# Patient Record
Sex: Male | Born: 1945 | Race: White | Hispanic: No | Marital: Married | Smoking: Never smoker
Health system: Southern US, Community
[De-identification: ages and names within clinical notes are randomized; demographics above are authoritative.]

## PROBLEM LIST (undated history)

## (undated) DIAGNOSIS — K5790 Diverticulosis of intestine, part unspecified, without perforation or abscess without bleeding: Secondary | ICD-10-CM

## (undated) DIAGNOSIS — E538 Deficiency of other specified B group vitamins: Secondary | ICD-10-CM

## (undated) DIAGNOSIS — M51369 Other intervertebral disc degeneration, lumbar region without mention of lumbar back pain or lower extremity pain: Secondary | ICD-10-CM

## (undated) DIAGNOSIS — K8020 Calculus of gallbladder without cholecystitis without obstruction: Secondary | ICD-10-CM

## (undated) DIAGNOSIS — D3501 Benign neoplasm of right adrenal gland: Secondary | ICD-10-CM

## (undated) DIAGNOSIS — G4733 Obstructive sleep apnea (adult) (pediatric): Secondary | ICD-10-CM

## (undated) DIAGNOSIS — H9190 Unspecified hearing loss, unspecified ear: Secondary | ICD-10-CM

## (undated) DIAGNOSIS — G4730 Sleep apnea, unspecified: Secondary | ICD-10-CM

## (undated) DIAGNOSIS — I700 Atherosclerosis of aorta: Secondary | ICD-10-CM

## (undated) DIAGNOSIS — D3502 Benign neoplasm of left adrenal gland: Secondary | ICD-10-CM

## (undated) DIAGNOSIS — D509 Iron deficiency anemia, unspecified: Secondary | ICD-10-CM

## (undated) DIAGNOSIS — N429 Disorder of prostate, unspecified: Secondary | ICD-10-CM

## (undated) DIAGNOSIS — D649 Anemia, unspecified: Secondary | ICD-10-CM

## (undated) DIAGNOSIS — I2510 Atherosclerotic heart disease of native coronary artery without angina pectoris: Secondary | ICD-10-CM

## (undated) DIAGNOSIS — N433 Hydrocele, unspecified: Secondary | ICD-10-CM

## (undated) DIAGNOSIS — I493 Ventricular premature depolarization: Secondary | ICD-10-CM

## (undated) DIAGNOSIS — I10 Essential (primary) hypertension: Secondary | ICD-10-CM

## (undated) DIAGNOSIS — K4090 Unilateral inguinal hernia, without obstruction or gangrene, not specified as recurrent: Secondary | ICD-10-CM

## (undated) DIAGNOSIS — E785 Hyperlipidemia, unspecified: Secondary | ICD-10-CM

## (undated) DIAGNOSIS — E119 Type 2 diabetes mellitus without complications: Secondary | ICD-10-CM

## (undated) DIAGNOSIS — R590 Localized enlarged lymph nodes: Secondary | ICD-10-CM

## (undated) DIAGNOSIS — Z7901 Long term (current) use of anticoagulants: Secondary | ICD-10-CM

## (undated) DIAGNOSIS — R7989 Other specified abnormal findings of blood chemistry: Secondary | ICD-10-CM

## (undated) DIAGNOSIS — M4800 Spinal stenosis, site unspecified: Secondary | ICD-10-CM

## (undated) DIAGNOSIS — F419 Anxiety disorder, unspecified: Secondary | ICD-10-CM

## (undated) DIAGNOSIS — N400 Enlarged prostate without lower urinary tract symptoms: Secondary | ICD-10-CM

## (undated) DIAGNOSIS — M1990 Unspecified osteoarthritis, unspecified site: Secondary | ICD-10-CM

## (undated) DIAGNOSIS — N281 Cyst of kidney, acquired: Secondary | ICD-10-CM

## (undated) HISTORY — DX: Anemia, unspecified: D64.9

## (undated) HISTORY — PX: EYE SURGERY: SHX253

## (undated) HISTORY — PX: COLONOSCOPY: SHX174

## (undated) HISTORY — DX: Anxiety disorder, unspecified: F41.9

## (undated) HISTORY — DX: Diverticulosis of intestine, part unspecified, without perforation or abscess without bleeding: K57.90

## (undated) HISTORY — PX: REPLACEMENT TOTAL KNEE: SUR1224

## (undated) HISTORY — DX: Essential (primary) hypertension: I10

## (undated) HISTORY — DX: Spinal stenosis, site unspecified: M48.00

---

## 1999-11-13 ENCOUNTER — Emergency Department (HOSPITAL_COMMUNITY): Admission: EM | Admit: 1999-11-13 | Discharge: 1999-11-13 | Payer: Self-pay | Admitting: Emergency Medicine

## 1999-11-13 ENCOUNTER — Encounter: Payer: Self-pay | Admitting: Emergency Medicine

## 2003-06-09 ENCOUNTER — Encounter: Payer: Self-pay | Admitting: Internal Medicine

## 2006-07-22 ENCOUNTER — Inpatient Hospital Stay (HOSPITAL_COMMUNITY): Admission: EM | Admit: 2006-07-22 | Discharge: 2006-07-25 | Payer: Self-pay | Admitting: Emergency Medicine

## 2006-07-23 ENCOUNTER — Encounter (INDEPENDENT_AMBULATORY_CARE_PROVIDER_SITE_OTHER): Payer: Self-pay | Admitting: *Deleted

## 2006-07-25 ENCOUNTER — Encounter (INDEPENDENT_AMBULATORY_CARE_PROVIDER_SITE_OTHER): Payer: Self-pay | Admitting: *Deleted

## 2006-07-28 ENCOUNTER — Ambulatory Visit: Payer: Self-pay | Admitting: Internal Medicine

## 2006-08-05 ENCOUNTER — Ambulatory Visit: Payer: Self-pay | Admitting: Internal Medicine

## 2006-08-05 LAB — CONVERTED CEMR LAB
Basophils Absolute: 0.1 10*3/uL (ref 0.0–0.1)
Basophils Relative: 0.8 % (ref 0.0–1.0)
Eosinophils Absolute: 0.3 10*3/uL (ref 0.0–0.6)
Eosinophils Relative: 3.3 % (ref 0.0–5.0)
HCT: 32.5 % — ABNORMAL LOW (ref 39.0–52.0)
Hemoglobin: 11.4 g/dL — ABNORMAL LOW (ref 13.0–17.0)
Lymphocytes Relative: 22.2 % (ref 12.0–46.0)
MCHC: 35.2 g/dL (ref 30.0–36.0)
MCV: 94.7 fL (ref 78.0–100.0)
Monocytes Absolute: 0.9 10*3/uL — ABNORMAL HIGH (ref 0.2–0.7)
Monocytes Relative: 8.9 % (ref 3.0–11.0)
Neutro Abs: 6.3 10*3/uL (ref 1.4–7.7)
Neutrophils Relative %: 64.8 % (ref 43.0–77.0)
Platelets: 545 10*3/uL — ABNORMAL HIGH (ref 150–400)
RBC: 3.43 M/uL — ABNORMAL LOW (ref 4.22–5.81)
RDW: 13.5 % (ref 11.5–14.6)
WBC: 9.8 10*3/uL (ref 4.5–10.5)

## 2006-09-12 ENCOUNTER — Ambulatory Visit: Payer: Self-pay | Admitting: Internal Medicine

## 2008-03-23 ENCOUNTER — Ambulatory Visit: Payer: Self-pay | Admitting: Urology

## 2008-08-14 ENCOUNTER — Emergency Department: Payer: Self-pay | Admitting: Emergency Medicine

## 2008-10-31 ENCOUNTER — Ambulatory Visit: Payer: Self-pay | Admitting: Internal Medicine

## 2008-10-31 DIAGNOSIS — E119 Type 2 diabetes mellitus without complications: Secondary | ICD-10-CM | POA: Insufficient documentation

## 2008-10-31 DIAGNOSIS — K625 Hemorrhage of anus and rectum: Secondary | ICD-10-CM | POA: Insufficient documentation

## 2008-10-31 DIAGNOSIS — K5730 Diverticulosis of large intestine without perforation or abscess without bleeding: Secondary | ICD-10-CM | POA: Insufficient documentation

## 2008-11-30 ENCOUNTER — Telehealth: Payer: Self-pay | Admitting: Internal Medicine

## 2008-12-01 ENCOUNTER — Encounter: Payer: Self-pay | Admitting: Internal Medicine

## 2008-12-01 ENCOUNTER — Ambulatory Visit: Payer: Self-pay | Admitting: Internal Medicine

## 2008-12-05 ENCOUNTER — Encounter: Payer: Self-pay | Admitting: Internal Medicine

## 2010-01-19 ENCOUNTER — Ambulatory Visit: Payer: Self-pay | Admitting: Internal Medicine

## 2010-01-19 DIAGNOSIS — D649 Anemia, unspecified: Secondary | ICD-10-CM | POA: Insufficient documentation

## 2010-01-22 ENCOUNTER — Ambulatory Visit: Payer: Self-pay | Admitting: Oncology

## 2010-01-22 LAB — CONVERTED CEMR LAB
Basophils Absolute: 0.1 10*3/uL (ref 0.0–0.1)
Basophils Relative: 0.9 % (ref 0.0–3.0)
Eosinophils Absolute: 0.2 10*3/uL (ref 0.0–0.7)
Eosinophils Relative: 2.5 % (ref 0.0–5.0)
Ferritin: 54.7 ng/mL (ref 22.0–322.0)
Folate: 8.6 ng/mL
HCT: 33.9 % — ABNORMAL LOW (ref 39.0–52.0)
Hemoglobin: 12 g/dL — ABNORMAL LOW (ref 13.0–17.0)
Iron: 91 ug/dL (ref 42–165)
Lymphocytes Relative: 24.1 % (ref 12.0–46.0)
Lymphs Abs: 2.2 10*3/uL (ref 0.7–4.0)
MCHC: 35.5 g/dL (ref 30.0–36.0)
MCV: 97.1 fL (ref 78.0–100.0)
Monocytes Absolute: 1 10*3/uL (ref 0.1–1.0)
Monocytes Relative: 11.1 % (ref 3.0–12.0)
Neutro Abs: 5.5 10*3/uL (ref 1.4–7.7)
Neutrophils Relative %: 61.4 % (ref 43.0–77.0)
Platelets: 370 10*3/uL (ref 150.0–400.0)
RBC: 3.49 M/uL — ABNORMAL LOW (ref 4.22–5.81)
RDW: 14.2 % (ref 11.5–14.6)
Saturation Ratios: 24.9 % (ref 20.0–50.0)
Transferrin: 260.8 mg/dL (ref 212.0–360.0)
Vitamin B-12: 226 pg/mL (ref 211–911)
WBC: 9 10*3/uL (ref 4.5–10.5)

## 2010-01-31 ENCOUNTER — Encounter: Payer: Self-pay | Admitting: Internal Medicine

## 2010-01-31 ENCOUNTER — Telehealth: Payer: Self-pay | Admitting: Internal Medicine

## 2010-01-31 LAB — LACTATE DEHYDROGENASE: LDH: 171 U/L (ref 94–250)

## 2010-01-31 LAB — COMPREHENSIVE METABOLIC PANEL
ALT: 19 U/L (ref 0–53)
AST: 19 U/L (ref 0–37)
Albumin: 4 g/dL (ref 3.5–5.2)
Alkaline Phosphatase: 57 U/L (ref 39–117)
BUN: 12 mg/dL (ref 6–23)
CO2: 28 meq/L (ref 19–32)
Calcium: 9.1 mg/dL (ref 8.4–10.5)
Chloride: 103 mEq/L (ref 96–112)
Creatinine, Ser: 0.78 mg/dL (ref 0.40–1.50)
Glucose, Bld: 98 mg/dL (ref 70–99)
Potassium: 3.9 mEq/L (ref 3.5–5.3)
Sodium: 137 mEq/L (ref 135–145)
Total Bilirubin: 0.6 mg/dL (ref 0.3–1.2)
Total Protein: 7.1 g/dL (ref 6.0–8.3)

## 2010-01-31 LAB — CBC & DIFF AND RETIC
BASO%: 0.5 % (ref 0.0–2.0)
Basophils Absolute: 0.1 10*3/uL (ref 0.0–0.1)
EOS%: 2.9 % (ref 0.0–7.0)
Eosinophils Absolute: 0.3 10*3/uL (ref 0.0–0.5)
HCT: 35.3 % — ABNORMAL LOW (ref 38.4–49.9)
HGB: 12 g/dL — ABNORMAL LOW (ref 13.0–17.1)
Immature Retic Fract: 7.6 % (ref 0.00–13.40)
LYMPH%: 20.1 % (ref 14.0–49.0)
MCH: 33 pg (ref 27.2–33.4)
MCHC: 34 g/dL (ref 32.0–36.0)
MCV: 97 fL (ref 79.3–98.0)
MONO#: 0.8 10*3/uL (ref 0.1–0.9)
MONO%: 7.8 % (ref 0.0–14.0)
NEUT#: 7.1 10*3/uL — ABNORMAL HIGH (ref 1.5–6.5)
NEUT%: 68.7 % (ref 39.0–75.0)
Platelets: 334 10*3/uL (ref 140–400)
RBC: 3.64 10*6/uL — ABNORMAL LOW (ref 4.20–5.82)
RDW: 13.8 % (ref 11.0–14.6)
Retic %: 1.21 % (ref 0.50–1.60)
Retic Ct Abs: 44.04 10*3/uL (ref 24.10–77.50)
WBC: 10.3 10*3/uL (ref 4.0–10.3)
lymph#: 2.1 10*3/uL (ref 0.9–3.3)
nRBC: 0 % (ref 0–0)

## 2010-01-31 LAB — CHCC SMEAR

## 2010-01-31 LAB — MORPHOLOGY
PLT EST: ADEQUATE
RBC Comments: NORMAL

## 2010-02-02 ENCOUNTER — Ambulatory Visit: Payer: Self-pay | Admitting: Internal Medicine

## 2010-02-04 LAB — PROTEIN ELECTROPHORESIS, SERUM
Albumin ELP: 58.6 % (ref 55.8–66.1)
Alpha-1-Globulin: 3.4 % (ref 2.9–4.9)
Alpha-2-Globulin: 11.4 % (ref 7.1–11.8)
Beta 2: 5.4 % (ref 3.2–6.5)
Beta Globulin: 6.2 % (ref 4.7–7.2)
Gamma Globulin: 15 % (ref 11.1–18.8)
Total Protein, Serum Electrophoresis: 7.1 g/dL (ref 6.0–8.3)

## 2010-02-04 LAB — KAPPA/LAMBDA LIGHT CHAINS
Kappa free light chain: 0.65 mg/dL (ref 0.33–1.94)
Kappa:Lambda Ratio: 0.79 (ref 0.26–1.65)
Lambda Free Lght Chn: 0.82 mg/dL (ref 0.57–2.63)

## 2010-02-04 LAB — METHYLMALONIC ACID, SERUM: Methylmalonic Acid, Quantitative: 108 nmol/L (ref 87–318)

## 2010-02-05 LAB — CONVERTED CEMR LAB
Fecal Occult Blood: NEGATIVE
OCCULT 1: NEGATIVE
OCCULT 2: NEGATIVE
OCCULT 3: NEGATIVE
OCCULT 4: NEGATIVE
OCCULT 5: NEGATIVE

## 2010-02-13 ENCOUNTER — Telehealth (INDEPENDENT_AMBULATORY_CARE_PROVIDER_SITE_OTHER): Payer: Self-pay | Admitting: *Deleted

## 2010-06-08 ENCOUNTER — Ambulatory Visit: Payer: Self-pay | Admitting: Oncology

## 2010-06-12 LAB — CBC WITH DIFFERENTIAL/PLATELET
BASO%: 0.4 % (ref 0.0–2.0)
Basophils Absolute: 0 10*3/uL (ref 0.0–0.1)
EOS%: 2.8 % (ref 0.0–7.0)
Eosinophils Absolute: 0.2 10*3/uL (ref 0.0–0.5)
HCT: 37.2 % — ABNORMAL LOW (ref 38.4–49.9)
HGB: 12.9 g/dL — ABNORMAL LOW (ref 13.0–17.1)
LYMPH%: 16.4 % (ref 14.0–49.0)
MCH: 33.7 pg — ABNORMAL HIGH (ref 27.2–33.4)
MCHC: 34.6 g/dL (ref 32.0–36.0)
MCV: 97.3 fL (ref 79.3–98.0)
MONO#: 1 10*3/uL — ABNORMAL HIGH (ref 0.1–0.9)
MONO%: 12.4 % (ref 0.0–14.0)
NEUT#: 5.3 10*3/uL (ref 1.5–6.5)
NEUT%: 68 % (ref 39.0–75.0)
Platelets: 344 10*3/uL (ref 140–400)
RBC: 3.82 10*6/uL — ABNORMAL LOW (ref 4.20–5.82)
RDW: 12.8 % (ref 11.0–14.6)
WBC: 7.8 10*3/uL (ref 4.0–10.3)
lymph#: 1.3 10*3/uL (ref 0.9–3.3)

## 2010-06-12 LAB — COMPREHENSIVE METABOLIC PANEL
ALT: 22 U/L (ref 0–53)
AST: 18 U/L (ref 0–37)
Albumin: 4.4 g/dL (ref 3.5–5.2)
Alkaline Phosphatase: 62 U/L (ref 39–117)
BUN: 12 mg/dL (ref 6–23)
CO2: 23 meq/L (ref 19–32)
Calcium: 9.2 mg/dL (ref 8.4–10.5)
Chloride: 100 meq/L (ref 96–112)
Creatinine, Ser: 0.8 mg/dL (ref 0.40–1.50)
Glucose, Bld: 91 mg/dL (ref 70–99)
Potassium: 4.1 mEq/L (ref 3.5–5.3)
Sodium: 134 meq/L — ABNORMAL LOW (ref 135–145)
Total Bilirubin: 0.4 mg/dL (ref 0.3–1.2)
Total Protein: 7 g/dL (ref 6.0–8.3)

## 2010-06-12 LAB — IRON AND TIBC
%SAT: 23 % (ref 20–55)
Iron: 80 ug/dL (ref 42–165)
TIBC: 353 ug/dL (ref 215–435)
UIBC: 273 ug/dL

## 2010-06-12 LAB — FERRITIN: Ferritin: 63 ng/mL (ref 22–322)

## 2010-06-12 LAB — LACTATE DEHYDROGENASE: LDH: 153 U/L (ref 94–250)

## 2010-08-02 NOTE — Procedures (Signed)
Summary: EGD   EGD  Procedure date:  09/12/2006  Findings:      Location: [facility]  Findings: Duodenitis    EGD  Procedure date:  09/12/2006  Findings:      Location: [facility]  Findings: Duodenitis   Patient Name: [name] [name]. MRN:  Procedure Procedures: Panendoscopy (EGD) CPT: 43235.    with biopsy(s)/brushing(s). CPT: D1846139.  Personnel: Endoscopist: Wilhemina Bonito. Marina Goodell, MD.  Exam Location: Exam performed in Outpatient Clinic. Outpatient  Patient Consent: Procedure, Alternatives, Risks and Benefits discussed, consent obtained, from patient. Consent was obtained by the RN.  Indications  Evaluation of: Anemia,  Positive fecal occult blood test  Symptoms: Hematochezia.  History  Current Medications: Patient is not currently taking Coumadin.  Pre-Exam Physical: Performed Sep 12, 2006  Cardio-pulmonary exam, HEENT exam, Abdominal exam, Mental status exam WNL.  Comments: Pt. history reviewed/updated, physical exam performed prior to initiation of sedation?yes Exam Exam Info: Maximum depth of insertion Duodenum, intended Duodenum. Patient position: on left side. Vocal cords visualized. Gastric retroflexion performed. Images taken. ASA Classification: II. Tolerance: excellent.  Sedation Meds: Patient assessed and found to be appropriate for moderate (conscious) sedation. Fentanyl 75 mcg. given IV. Versed 7 mg. given IV. Cetacaine Spray 2 sprays given aerosolized.  Monitoring: BP and pulse monitoring done. Oximetry used. Supplemental O2 given  Findings Normal: Proximal Esophagus to Duodenal 2nd Portion.  MUCOSAL ABNORMALITY: Duodenal Bulb. Erythematous mucosa. RUT done, results pending. ICD9: Duodenitis without Hemorrhage: 535.60.   Assessment  Diagnoses: 535.60: Duodenitis without Hemorrhage. very mild.   Events  Unplanned Intervention: No unplanned interventions were required.  Unplanned Events: There were no  complications. Plans Disposition: After procedure patient sent to recovery. After recovery patient sent home.  Scheduling: Follow-up prn.   This report was created from the original endoscopy report, which was reviewed and signed by the above listed endoscopist.   cc:  Talmadge Coventry, MD      The Patient

## 2010-08-02 NOTE — Assessment & Plan Note (Signed)
Summary: RECTAL BLEEDING...AS.   History of Present Illness Visit Type: Follow-up Visit Primary GI MD: Yancey Flemings MD Primary Provider: Ursula Beath, MD Chief Complaint: rectal bleeding on 4/28, "looked like crushed cranberries", no bleeding now History of Present Illness:   65 year old with a history of hypertension, osteoarthritis,diabetes mellitus, depression, and diverticular bleeding. He presents today after an episode of rectal bleeding. He underwent initial colonoscopy in December 2004 and was found to have marked diverticulosis. He was hospitalized in January of 2008 with acute GI bleeding felt to be diverticular in origin. He subsequently underwent upper endoscopy in March of 2008 for Hemoccult-positive stool. This revealed mild duodenitis only. Helicobacter Pylori testing was negative. He has not been seen since. He did receive a routine recall notice for colonoscopy last year. Today's visit is prompted by an episode of rectal bleeding 5 days ago. Initially red that of moderate volume. This turned dark by evening and within several days was normal. Stools are now brown.There was no associated nausea, abdominal pain, dizziness, weakness, or rectal pain. He does have hemorrhoids. He takes 2 oral agents for his diabetes. He was taking aspirin which he discontinued with bleeding.   GI Review of Systems      Denies abdominal pain, acid reflux, belching, bloating, chest pain, dysphagia with liquids, dysphagia with solids, heartburn, loss of appetite, nausea, vomiting, vomiting blood, weight loss, and  weight gain.      Reports diverticulosis, hemorrhoids, and  rectal bleeding.     Denies anal fissure, black tarry stools, change in bowel habit, constipation, diarrhea, fecal incontinence, heme positive stool, irritable bowel syndrome, jaundice, light color stool, liver problems, and  rectal pain. Preventive Screening-Counseling & Management     Smoking Status: never     Does Patient  Exercise: yes      Drug Use:  no.      Current Medications (verified): 1)  Avandamet 09-998 Mg Tabs (Rosiglitazone-Metformin) .... Take 1 Tablet By Mouth Two Times A Day 2)  Glipizide 5 Mg Tabs (Glipizide) .... Take 1 Tablet By Mouth Two Times A Day 3)  Ambien 5 Mg Tabs (Zolpidem Tartrate) .... As Needed 4)  Xanax 1 Mg Tabs (Alprazolam) .... As Needed 5)  Benazepril Hcl 40 Mg Tabs (Benazepril Hcl) .... Take 1 Tablet By Mouth Two Times A Day 6)  Vitamin D 40981 Unit Caps (Ergocalciferol) .... Take 1 Tablet By Mouth Once Week 7)  Adult Aspirin Ec Low Strength 81 Mg Tbec (Aspirin) .... Take 1 Tablet By Mouth Once A Day 8)  Glucosamine-Chondroitin 500-400 Mg Caps (Glucosamine-Chondroitin) .... Take 1 Tablet By Mouth Two Times A Day  Allergies (verified): No Known Drug Allergies  Past History:  Past Medical History:    Anemia    Diabetes    GI Bleed-Diverticular bleed    Hypertension    Arthritis  Past Surgical History:    Unremarkable  Family History:    No FH of Colon Cancer:-sister had tumor on outside of colon, but unsure if colon cancer    Family History of Diabetes: Mother  Social History:    Married, 2 boys, 1 girls    Retired Emergency planning/management officer, delivers auto parts    Patient has never smoked.     Alcohol Use - yes occ    Daily Caffeine Use "too much"    Illicit Drug Use - no    Patient gets regular exercise. golf 2 x week, pool/swimming 5 hours/week    Smoking Status:  never  Drug Use:  no    Does Patient Exercise:  yes  Review of Systems       joint aches, elevated blood pressure. All other systems reviewed and reported as negative  Vital Signs:  Patient profile:   65 year old male Height:      72 inches Weight:      307 pounds BMI:     41.79 Pulse rate:   76 / minute Pulse rhythm:   irregular BP sitting:   152 / 88  (right arm) Cuff size:   large  Vitals Entered By: Francee Piccolo CMA (Oct 31, 2008 2:24 PM)  Physical Exam  General:   overweight,Well developed, well nourished, no acute distress. Head:  Normocephalic and atraumatic. Eyes:  PERRLA, no icterus. Nose:  No deformity, discharge,  or lesions. Mouth:  No deformity or lesions, dentition normal. Neck:  Supple; no masses or thyromegaly. Lungs:  Clear throughout to auscultation. Heart:  Regular rate and rhythm; no murmurs, rubs,  or bruits. Abdomen:  Soft, nontender and nondistended. No masses, hepatosplenomegaly or hernias noted. Normal bowel sounds. Rectal:  deferred Msk:  Symmetrical with no gross deformities. Normal posture. Pulses:  Normal pulses noted. Extremities:  No clubbing, cyanosis, edema or deformities noted. Neurologic:  Alert and  oriented x4;  grossly normal neurologically. Skin:  Intact without significant lesions or rashes. Psych:  Alert and cooperative. Normal mood and affect.   Impression & Recommendations:  Problem # 1:  RECTAL BLEEDING (ICD-93.59) 65 year old with multiple medical problems who presents for evaluation after experiencing self-limited minor lower GI bleeding. This may have been a mild diverticular bleed or possibly hemorrhoidal. Neoplasia is also possible.  Plan: #1. Colonoscopy. The nature of the procedure as well as the risks, benefits, and alternatives have been reviewed. He understood and agreed to proceed. Movi prep prescribed. The patient instructed on its use. He is high-risk due to body habitus and diabetes. We will hold his oral diabetic agents the day of the procedure an effort to avoid unwanted hypoglycemia.  Problem # 2:  DM (ICD-250.00) as it pertains to his procedural management. See above.  Problem # 3:  DIVERTICULOSIS OF COLON (ICD-562.10) severe diverticulosis with a history of diverticular bleeding.  Other Orders: Colonoscopy (Colon)  Patient Instructions: 1)  Colonoscopy [facility] 12/01/08 11:30 2)  Movi prep instructions given to patient. 3)  Movi prep rx. sent to pharmacy 4)  Colonoscopy and Flexible  Sigmoidoscopy brochure given.  5)  Patient to hold diabetic meds morning of procedure. 6)  The medication list was reviewed and reconciled.  All changed / newly prescribed medications were explained.  A complete medication list was provided to the patient / caregiver. 7)  Copy to: Dr. Ursula Beath Prescriptions: MOVIPREP 100 GM  SOLR (PEG-KCL-NACL-NASULF-NA ASC-C) As per prep instructions.  #1 x 0   Entered by:   Milford Cage CMA   Authorized by:   Hilarie Fredrickson MD   Signed by:   Milford Cage CMA on 10/31/2008   Method used:   Electronically to        [facility] [phone]* (retail)       [address] [place], [place]  [address]       Ph: [phone] or [phone]       Fax: [phone]   RxID:   [id number]

## 2010-08-02 NOTE — Procedures (Signed)
Summary: Colonoscopy   Colonoscopy  Procedure date:  12/01/2008  Findings:      Location:  [facility].    Procedures Next Due Date:    Colonoscopy: 11/2018  COLONOSCOPY PROCEDURE REPORT  PATIENT:  [name], [name]  MR#:  [id number] BIRTHDATE:   13-Apr-1946, 65 yrs. old   GENDER:   male  ENDOSCOPIST:   Wilhemina Bonito. Eda Keys, MD Referred by: Self / Office  PROCEDURE DATE:  12/01/2008 PROCEDURE:  Colonoscopy with snare polypectomy ASA CLASS:   Class II INDICATIONS: rectal bleeding   MEDICATIONS:    Fentanyl 100 mcg IV, Versed 10 mg IV  DESCRIPTION OF PROCEDURE:   After the risks benefits and alternatives of the procedure were thoroughly explained, informed consent was obtained.  Digital rectal exam was performed and revealed small external hemorrhoids.   The LB CF-[id number] endoscope was introduced through the anus and advanced to the cecum, which was identified by both the appendix and ileocecal valve, without limitations.  TIME TO CECUM = 4:11 MIN.The quality of the prep was excellent, using MoviPrep.  The instrument was then withdrawn (TIME = 13:24  MIN) as the colon was fully examined. <<PROCEDUREIMAGES>>              <<OLD IMAGES>>  FINDINGS:  Two 4mm polyps were found in the transverse and descending colon. Polyps were snared without cautery. Retrieval was successful.  Severe diverticulosis was found throughout the colon.   Retroflexed views in the rectum revealed no abnormalities.    The scope was then withdrawn from the patient and the procedure completed.  COMPLICATIONS:   None  ENDOSCOPIC IMPRESSION:  1) Two polyps in the descending colon - removed  2) Severe diverticulosis throughout the colon  RECOMMENDATIONS:  1) Repeat colonoscopy in 5 years if polyp adenomatous; otherwise 10 years     _______________________________ Wilhemina Bonito. Eda Keys, MD  CC: Ursula Beath, MD; The Patient    REPORT OF SURGICAL PATHOLOGY   Case #: [id number] Patient  Name: [name] [name], [name] [name] Office Chart Number:  [id number]   MRN: [id number] Pathologist: Beulah Gandy. Luisa Hart, MD DOB/Age  Sep 10, 1945 (Age: 65)    Gender: M Date Taken:  12/01/2008 Date Received: 12/02/2008   FINAL DIAGNOSIS   ***MICROSCOPIC EXAMINATION AND DIAGNOSIS***   COLON, TRANSVERSE AND DESCENDING POLYPS:  TWO HYPERPLASTIC POLYPS.  NO ADENOMATOUS CHANGE OR MALIGNANCY IDENTIFIED.   mj Date Reported:  12/05/2008     Beulah Gandy. Luisa Hart, MD *** Electronically Signed Out By JDP ***  December 05, 2008 MRN: [id number]    [facility] [address] [place], [place]  [address]    Dear [name],  I am pleased to inform you that the colon polyp(s) removed during your recent colonoscopy was (were) found to be benign (no cancer detected) upon pathologic examination.  I recommend you have a repeat colonoscopy examination in 10 years to look for recurrent polyps, as having colon polyps increases your risk for having recurrent polyps or even colon cancer in the future.  Should you develop new or worsening symptoms of abdominal pain, bowel habit changes or bleeding from the rectum or bowels, please schedule an evaluation with either your primary care physician or with me.  Additional information/recommendations:  __ No further action with gastroenterology is needed at this time. Please      follow-up with your primary care physician for your other healthcare      needs.    Please call us if you are having persistent problems  or have questions about your condition that have not been fully answered at this time.  Sincerely,  Hilarie Fredrickson MD  This letter has been electronically signed by your physician.   This report was created from the original endoscopy report, which was reviewed and signed by the above listed endoscopist.

## 2010-08-02 NOTE — Discharge Summary (Signed)
Summary: Hospital Discharge   NAME:  [name] [name], [name] [name] NO.:  [id number]   MEDICAL RECORD NO.:  [id number]          PATIENT TYPE:  INP   LOCATION:  1424                         FACILITY:  [facility]   PHYSICIAN:  Mobolaji B. Bakare, M.D.DATE OF BIRTH:  09-24-1945   DATE OF ADMISSION:  07/22/2006  DATE OF DISCHARGE:  07/25/2006                               DISCHARGE SUMMARY   PRIMARY CARE PHYSICIAN:  Talmadge Coventry, M.D.   GASTROENTEROLOGIST:  Wilhemina Bonito. Marina Goodell, MD   PRIMARY DIAGNOSIS:  1. GI bleed, likely lower GI.  2. Acute blood loss secondary to #1.  3. Diabetes mellitus.  4. Hypertension.  5. Obesity.   CONSULTATIONS:  GI consults provided by Dr. Lina Sar.   PROCEDURES:  Red blood cell tagged scan.  This was negative for active  bleeding.   BRIEF HISTORY:  Please refer to the admission H&P done by Dr. Michaelyn Barter.  In brief, [name] [name] is a 65 year old Caucasian male who  presented with rectal bleeding, had large bowel movement which was  bright red blood.  It was unclear if this was mixed with stool or not.  However, he was symptomatic, not feeling well, and he went to Dr.  Stephannie Peters office on the day of admission.  CBC that was done revealed  hemoglobin 6.8, and he was sent to the emergency room.  On arrival in  the emergency room, the patient was normotensive with a blood pressure  of 136/45, pulse 100, otherwise, rest of physical examination was  unremarkable except for pallor.  He was admitted for further evaluation  and blood transfusion.   HOSPITAL COURSE:  #1 - GI BLEED STATUS POST SEVEN UNITS OF PACKED RED  BLOOD CELLS.  This was felt to be lower GI.  The patient did not have  hematemesis or abdominal pain, although, he has been on aspirin and has  been using ibuprofen regularly for pain.  Stool was not previously  melenic, it was more dynamically stable at the time of hospitalization.  He was seen by Gastroenterology, Dr.  Juanda Chance.  He  was conservatively  managed.  There was no active bleeding during the course of  hospitalization.  Bowel movements did not have bright red blood, it was  old blood noted on stool.  He was transfused with a total of seven units  of packed red blood cells.  The patient remained hemodynamically stable  during the course of hospitalization.  Decision was made because of  patient's preference to have colonoscopy done as outpatient.  He was  stable enough to be discharged.  Hemoglobin, at the time of discharge,  was 10.6 with hematocrit of 30.   #2 - DIABETES MELLITUS.  The patient was on Avandamet and glipizide  prior to hospitalization.  These were held because of the n.p.o. status  and clear liquid.  We gradually introduced Avandamet.  He is now having  diabetic diet.  He will resume his previous own medication.   #3 - HYPERTENSION.  This was fairly controlled during the course of  hospitalization with the  introduction of the Hyzaar.  Blood pressure at  the time of discharge was 154/82.  This will be further optimized by Dr.  Smith Mince.   DISCHARGE LABORATORIES:  Sodium 133, potassium 4.7, chloride 99, CO2 28,  BUN 12, creatinine 0.78, calcium 8.8, hemoglobin 12.6, hematocrit 30.0.   DISCHARGE MEDICATIONS:  1. Iron sulfate one p.o. b.i.d.  2. Avandamet 09/998 p.o. b.i.d.  3. Glipizide 5 mg b.i.d.  4. Hyzaar.  5. Zoloft.   DISCONTINUED MEDICATIONS:  1. Ibuprofen.  2. Aspirin.   RECOMMENDATIONS:  1. Follow up with Dr. Marina Goodell on August 05, 2006, at 1:45 to make      arrangements for colonoscopy.  2. Follow up with Dr. Smith Mince in one week.  3. CBC to be checked in one week.   DISCHARGE INSTRUCTIONS:  The patient was instructed to return to the  emergency room should he develop persistent dizziness and rectal  bleeding recourse.      Mobolaji B. Corky Downs, M.D.  Electronically Signed     MBB/MEDQ  D:  07/25/2006  T:  07/25/2006  Job:  [id number]   cc:   Talmadge Coventry,  M.D.  Fax: [phone]   Wilhemina Bonito. Marina Goodell, MD  [address]  [place]  [place] [address]

## 2010-08-02 NOTE — Assessment & Plan Note (Signed)
Summary: LOW HEMOGLOBIN...EM   History of Present Illness Visit Type: Follow-up Visit Primary GI MD: Yancey Flemings MD Primary Provider: Ursula Beath, MD Chief Complaint: Anemia - iron def. History of Present Illness:   65 year old with a history of hypertension, diabetes mellitus,  anxiety/depression, and acute diverticular bleed. He is sent today regarding anemia. Patient's last hemoglobin in this office was February 2008. The hemoglobin was 11.4 with an MCV of 94.7. Blood work from February 2011 shows a hemoglobin of 11.1 with an MCV of 96, in March 11 0.8 with an MCV of 97 and on December 20, 2009 11.4 with MCV of 99. The patient denies any GI symptoms. He denies GI bleeding. Takes baby aspirin daily. Has been on iron for 4 months since his blood work in February. He is accompanied today by his wife. His last colonoscopy was in June 2010 then revealed severe diverticulosis and diminutive polyps. He did undergo upper endoscopy in 2008 for anemia and Hemoccult-positive stool. This was essentially normal except for mild non-erosive duodenitis.   GI Review of Systems      Denies abdominal pain, acid reflux, belching, bloating, chest pain, dysphagia with liquids, dysphagia with solids, heartburn, loss of appetite, nausea, vomiting, vomiting blood, weight loss, and  weight gain.      Reports diverticulosis and  hemorrhoids.     Denies anal fissure, black tarry stools, change in bowel habit, constipation, diarrhea, fecal incontinence, heme positive stool, irritable bowel syndrome, jaundice, light color stool, liver problems, rectal bleeding, and  rectal pain.    Current Medications (verified): 1)  Avandamet 09-998 Mg Tabs (Rosiglitazone-Metformin) .... Take 1 Tablet By Mouth Two Times A Day 2)  Glipizide 5 Mg Tabs (Glipizide) .... Take 1 Tablet By Mouth Two Times A Day 3)  Ambien 5 Mg Tabs (Zolpidem Tartrate) .... As Needed 4)  Xanax 1 Mg Tabs (Alprazolam) .... As Needed 5)  Benazepril Hcl 40 Mg  Tabs (Benazepril Hcl) .... Take 1 Tablet By Mouth Two Times A Day 6)  Vitamin D 16109 Unit Caps (Ergocalciferol) .... Take 1 Tablet By Mouth Once Week 7)  Adult Aspirin Ec Low Strength 81 Mg Tbec (Aspirin) .... Take 1 Tablet By Mouth Once A Day 8)  Glucosamine-Chondroitin 500-400 Mg Caps (Glucosamine-Chondroitin) .... Take 1 Tablet By Mouth Two Times A Day 9)  Ferrous Sulfate 325 (65 Fe) Mg Tabs (Ferrous Sulfate) .... Two Times A Day 10)  Terazosin Hcl 1 Mg Caps (Terazosin Hcl) .... Once Daily  Allergies (verified): No Known Drug Allergies  Past History:  Past Medical History: Reviewed history from 10/31/2008 and no changes required. Anemia Diabetes GI Bleed-Diverticular bleed Hypertension Arthritis  Past Surgical History: Reviewed history from 10/31/2008 and no changes required. Unremarkable  Family History: Reviewed history from 10/31/2008 and no changes required. No FH of Colon Cancer:-sister had tumor on outside of colon, but unsure if colon cancer Family History of Diabetes: Mother  Social History: Reviewed history from 10/31/2008 and no changes required. 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  Alcohol Use - yes occ Daily Caffeine Use "too much" Illicit Drug Use - no Patient gets regular exercise. golf 2 x week, pool/swimming 5 hours/week  Review of Systems       The patient complains of anemia and arthritis/joint pain.  The patient denies allergy/sinus, anxiety-new, back pain, blood in urine, breast changes/lumps, change in vision, confusion, cough, coughing up blood, depression-new, fainting, fatigue, fever, headaches-new, hearing problems, heart murmur,  heart rhythm changes, itching, menstrual pain, muscle pains/cramps, night sweats, nosebleeds, pregnancy symptoms, shortness of breath, skin rash, sleeping problems, sore throat, swelling of feet/legs, swollen lymph glands, thirst - excessive , urination -  excessive , urination changes/pain, urine leakage, vision changes, and voice change.    Vital Signs:  Patient profile:   65 year old male Height:      72 inches Weight:      310.25 pounds BMI:     42.23 Pulse rate:   88 / minute Pulse rhythm:   irregular BP sitting:   152 / 86  (left arm) Cuff size:   large  Vitals Entered By: June McMurray CMA Duncan Dull) (January 19, 2010 3:38 PM)  Physical Exam  General:  Well developed,obese, well nourished, no acute distress. Head:  Normocephalic and atraumatic. Eyes:  PERRLA, no icterus. Ears:  Normal auditory acuity. Mouth:  No deformity or lesions Neck:  Supple; no masses or thyromegaly. Lungs:  Clear throughout to auscultation. Heart:  Regular rate and rhythm; no murmurs, rubs,  or bruits. Abdomen:  Soft, nontender and nondistended. No masses, hepatosplenomegaly or hernias noted. Normal bowel sounds. Msk:  Symmetrical with no gross deformities. Normal posture. Pulses:  Normal pulses noted. Extremities:  No clubbing, cyanosis, edema or deformities noted. Neurologic:  Alert and  oriented x4 Skin:  Intact without significant lesions or rashes. Psych:  Alert and cooperative. Normal mood and affect.   Impression & Recommendations:  Problem # 1:  ANEMIA-UNSPECIFIED (ICD-285.9) patient presents today with normocytic to slightly macrocytic anemia with stable hemoglobins in the mid 11 range since 2008. Nothing to suggest iron deficiency or GI blood loss. Etiology of chronic persisting anemia unclear. No response to iron therapy.  Plan: #1. Repeat CBC today #2. B12, folate, and iron studies including ferritin #3. Hemoccult cards #4. Consider hematology referral pending the results of the above  Problem # 2:  DIVERTICULOSIS OF COLON (ICD-562.10) with a history of acute diverticular bleeding. No recurrence since January 2008  Problem # 3:  SCREENING COLORECTAL-CANCER (ICD-V76.51) screening up-to-date. Due for routine repeat screening in  2020  Other Orders: TLB-CBC Platelet - w/Differential (85025-CBCD) TLB-Ferritin (82728-FER) TLB-Iron, (Fe) Total (83540-FE) TLB-IBC Pnl (Iron/FE;Transferrin) (83550-IBC) TLB-B12, Serum-Total ONLY (04540-J81) TLB-Folic Acid (Folate) (82746-FOL) [facility] Hemoccult Cards #3 (take home) (Hem cards #3)  Patient Instructions: 1)  Labs ordered for patient to have drawn today. 2)  Hemoccult cards given to patient with instructions-Please mail or bring back within 2 weeks time. 3)  Copy sent to : Clancy Gourd, MD 4)  The medication list was reviewed and reconciled.  All changed / newly prescribed medications were explained.  A complete medication list was provided to the patient / caregiver.

## 2010-08-02 NOTE — Procedures (Signed)
Summary: Colonoscopy   Colonoscopy  Procedure date:  06/09/2003  Findings:      Results: Diverticulosis.       Location:  [facility].    Comments:      Repeat colonoscopy in 5 years.   Procedures Next Due Date:    Colonoscopy: 05/2008  Colonoscopy  Procedure date:  06/09/2003  Findings:      Results: Diverticulosis.       Location:  [facility].    Comments:      Repeat colonoscopy in 5 years.   Procedures Next Due Date:    Colonoscopy: 05/2008 Patient Name: [name] [name], [name] [name]. MRN:  Procedure Procedures: Colonoscopy CPT: [phone].  Personnel: Endoscopist: Wilhemina Bonito. Marina Goodell, MD.  Referred By: Talmadge Coventry, MD.  Exam Location: Exam performed in Outpatient Clinic. Outpatient  Patient Consent: Procedure, Alternatives, Risks and Benefits discussed, consent obtained, from patient. Consent was obtained by the RN.  Indications  Average Risk Screening Routine.  History  Current Medications: Patient is not currently taking Coumadin.  Pre-Exam Physical: Performed Jun 09, 2003. Entire physical exam was normal.  Exam Exam: Extent of exam reached: Cecum, extent intended: Cecum.  The cecum was identified by appendiceal orifice and IC valve. Patient position: on left side. Colon retroflexion performed. Images taken. ASA Classification: II. Tolerance: excellent.  Monitoring: Pulse and BP monitoring, Oximetry used. Supplemental O2 given.  Colon Prep Used Golytely for colon prep. Prep results: fair, adequate exam.  Sedation Meds: Patient assessed and found to be appropriate for moderate (conscious) sedation. Fentanyl 100 mcg. given IV. Versed 12 given IV.  Comments: IMAGE FILE DOWN FOR PHOTO DOCUMENATATION Findings - DIVERTICULOSIS: Ascending Colon to Sigmoid Colon. ICD9: Diverticulosis, Colon: 562.10. Comments: MARKED CHANGES THROUGHOUT.   Assessment Abnormal examination, see findings above.  Diagnoses: 562.10: Diverticulosis, Colon.    Events  Unplanned Interventions: No intervention was required.  Unplanned Events: There were no complications. Plans Disposition: After procedure patient sent to recovery. After recovery patient sent home.  Scheduling/Referral: Colonoscopy, to Wilhemina Bonito. Marina Goodell, MD, IN 5 YEARS FOR REPEAT SCREENING,   Comments: RETURN TO THE CARE OF DR. MAZZOCCHI  This report was created from the original endoscopy report, which was reviewed and signed by the above listed endoscopist.   cc:  Talmadge Coventry, MD

## 2010-08-02 NOTE — Progress Notes (Signed)
Summary: Request for Lab results  Phone Note From Other Clinic Call back at [phone]   Caller: Dewayne Hatch, nurse Call For: Dr. Marina Goodell Summary of Call: Dr. Jethro Bolus would like results from stool sample August 1st... fax [phone] Initial call taken by: Vallarie Mare,  January 31, 2010 4:46 PM  Follow-up for Phone Call        Lab is checking to see if pt. submitted stool specimens and they will call Patty. Follow-up by: Teryl Lucy RN,  January 31, 2010 5:00 PM  Additional Follow-up for Phone Call Additional follow up Details #1::        The lab called and states that the pt never turned in the Hem cards.  I called the pt to see if he still has them and if there was a problem getting them completed and I left a message on his machine.  I called Ann at Dr Lodema Pilot office and advised her the pt never completed the cards and that I have a message to the pt to return my call. Additional Follow-up by: Chales Abrahams CMA Duncan Dull),  February 01, 2010 9:36 AM    Additional Follow-up for Phone Call Additional follow up Details #2::    pt says he mailed the cards on 01/29/10.  I called the lab again to see if they recieved them today.  she will call  me back. Chales Abrahams CMA Duncan Dull)  February 01, 2010 1:36 PM   Lab has recieved the cards.  Will send them to Dr Gaylyn Rong when available. Follow-up by: Chales Abrahams CMA Duncan Dull),  February 02, 2010 2:56 PM

## 2010-08-02 NOTE — Letter (Signed)
Summary: Patient Notice- Polyp Results  [facility]  [address] [place], [place] [address]   Phone: [phone]  Fax: [phone]        December 05, 2008 MRN: [id number]    [facility] [address] [place], [place]  [address]    Dear Mr. QUINNEY,  I am pleased to inform you that the colon polyp(s) removed during your recent colonoscopy was (were) found to be benign (no cancer detected) upon pathologic examination.  I recommend you have a repeat colonoscopy examination in 10 years to look for recurrent polyps, as having colon polyps increases your risk for having recurrent polyps or even colon cancer in the future.  Should you develop new or worsening symptoms of abdominal pain, bowel habit changes or bleeding from the rectum or bowels, please schedule an evaluation with either your primary care physician or with me.  Additional information/recommendations:  __ No further action with gastroenterology is needed at this time. Please      follow-up with your primary care physician for your other healthcare      needs.    Please call us if you are having persistent problems or have questions about your condition that have not been fully answered at this time.  Sincerely,  Hilarie Fredrickson MD  This letter has been electronically signed by your physician.

## 2010-08-02 NOTE — Progress Notes (Signed)
Summary: [facility] appt  Phone Note Outgoing Call   Call placed by: Chales Abrahams CMA Duncan Dull),  February 13, 2010 10:25 AM Summary of Call: called [facility] to see if appt has been scheduled yet.  left message on machine to call back  Initial call taken by: Chales Abrahams CMA Duncan Dull),  February 13, 2010 10:25 AM  Follow-up for Phone Call        spoke with the pt he has been seen by Dr Gaylyn Rong.  I have called [facility] and asked them to send a copy of the office visit Follow-up by: Chales Abrahams CMA Duncan Dull),  February 13, 2010 4:31 PM

## 2010-08-02 NOTE — Progress Notes (Signed)
Summary: ? re prep  Phone Note Call from Patient Call back at Home Phone [phone]   Caller: Patient Call For: Tyger Oka Reason for Call: Talk to Nurse Summary of Call: Patient has questions regarding prep instructions Initial call taken by: Tawni Levy,  November 30, 2008 10:58 AM  Follow-up for Phone Call         2 attempts to return pt call,left mess x1 and Erlinda Hong left mess. x1. As of 4:45pm ,pt. has not called back. Follow-up by: Alease Frame RN,  November 30, 2008 4:48 PM    called pt back ,no answer,left mess fpr pt to call us back. 1415 November 30, 2008/ph

## 2010-08-02 NOTE — Letter (Signed)
[facility]   Imported By: Lennie Odor 02/14/2010 14:24:47  _____________________________________________________________________  External Attachment:    Type:   Image     Comment:   External Document

## 2010-10-03 ENCOUNTER — Other Ambulatory Visit: Payer: Self-pay | Admitting: Oncology

## 2010-10-03 ENCOUNTER — Encounter (HOSPITAL_BASED_OUTPATIENT_CLINIC_OR_DEPARTMENT_OTHER): Payer: Medicare Other | Admitting: Oncology

## 2010-10-03 DIAGNOSIS — D649 Anemia, unspecified: Secondary | ICD-10-CM

## 2010-10-03 DIAGNOSIS — I10 Essential (primary) hypertension: Secondary | ICD-10-CM

## 2010-10-03 DIAGNOSIS — Z7982 Long term (current) use of aspirin: Secondary | ICD-10-CM

## 2010-10-03 DIAGNOSIS — E119 Type 2 diabetes mellitus without complications: Secondary | ICD-10-CM

## 2010-10-03 LAB — CBC WITH DIFFERENTIAL/PLATELET
BASO%: 0.5 % (ref 0.0–2.0)
Basophils Absolute: 0 10*3/uL (ref 0.0–0.1)
EOS%: 2.2 % (ref 0.0–7.0)
Eosinophils Absolute: 0.2 10*3/uL (ref 0.0–0.5)
HCT: 35.3 % — ABNORMAL LOW (ref 38.4–49.9)
HGB: 12.1 g/dL — ABNORMAL LOW (ref 13.0–17.1)
LYMPH%: 21.8 % (ref 14.0–49.0)
MCH: 33.3 pg (ref 27.2–33.4)
MCHC: 34.4 g/dL (ref 32.0–36.0)
MCV: 96.9 fL (ref 79.3–98.0)
MONO#: 0.7 10*3/uL (ref 0.1–0.9)
MONO%: 7.7 % (ref 0.0–14.0)
NEUT#: 6 10*3/uL (ref 1.5–6.5)
NEUT%: 67.8 % (ref 39.0–75.0)
Platelets: 335 10*3/uL (ref 140–400)
RBC: 3.64 10*6/uL — ABNORMAL LOW (ref 4.20–5.82)
RDW: 13.2 % (ref 11.0–14.6)
WBC: 8.9 10*3/uL (ref 4.0–10.3)
lymph#: 1.9 10*3/uL (ref 0.9–3.3)

## 2010-10-03 LAB — COMPREHENSIVE METABOLIC PANEL
ALT: 14 U/L (ref 0–53)
AST: 16 U/L (ref 0–37)
Albumin: 4.2 g/dL (ref 3.5–5.2)
Alkaline Phosphatase: 70 U/L (ref 39–117)
BUN: 11 mg/dL (ref 6–23)
CO2: 23 mEq/L (ref 19–32)
Calcium: 8.8 mg/dL (ref 8.4–10.5)
Chloride: 98 mEq/L (ref 96–112)
Creatinine, Ser: 0.81 mg/dL (ref 0.40–1.50)
Glucose, Bld: 159 mg/dL — ABNORMAL HIGH (ref 70–99)
Potassium: 4.3 meq/L (ref 3.5–5.3)
Sodium: 130 meq/L — ABNORMAL LOW (ref 135–145)
Total Bilirubin: 0.4 mg/dL (ref 0.3–1.2)
Total Protein: 6.5 g/dL (ref 6.0–8.3)

## 2010-10-03 LAB — IRON AND TIBC
%SAT: 21 % (ref 20–55)
Iron: 68 ug/dL (ref 42–165)
TIBC: 322 ug/dL (ref 215–435)
UIBC: 254 ug/dL

## 2010-10-03 LAB — FERRITIN: Ferritin: 70 ng/mL (ref 22–322)

## 2010-10-08 LAB — GLUCOSE, CAPILLARY
Glucose-Capillary: 107 mg/dL — ABNORMAL HIGH (ref 70–99)
Glucose-Capillary: 91 mg/dL (ref 70–99)

## 2010-11-16 NOTE — Assessment & Plan Note (Signed)
[facility]                         GASTROENTEROLOGY OFFICE NOTE   SEGUNDO, MAKELA                           MRN:          [id number]  DATE:08/05/2006                            DOB:          1945/09/21    REFERRING PHYSICIAN:  Talmadge Coventry, M.D.   REASON FOR CONSULTATION:  GI bleed.   HISTORY:  This is a pleasant, 65 year old, white male with a history of  hypertension, diabetes and depression. He was initially seen in this  office as an outpatient after direct referral for screening colonoscopy  June 09, 2003. The examination revealed severe pandiverticulosis. No  other abnormalities. Followup in 5 years recommended. The patient is now  referred through the courtesy of Dr. Smith Mince regarding recent problems  with GI bleeding. The patient reports to me that this past summer he was  told that he was borderline anemic. Repeat hemoglobin shortly thereafter  was in the low normal range. He had been using nonsteroidal  antiinflammatory drugs and there was some consideration that he may have  had some occult blood loss on this basis. He has had fatigue in recent  months. He was otherwise well until July 22, 2006 when he was  admitted to the hospital after having had 2-3 days of severe  intermittent rectal bleeding. This was painless. On admission to [facility], he was anemic with a hemoglobin of 7.7. His MCV was normal.  Coagulation parameters normal. Basic metabolic panel revealed normal  BUN. He was transfused multiple units of blood. After being admitted, he  had no further bleeding. He underwent a tagged red blood cell scan which  was negative. He was seen in consultation by colleague, Dr. Juanda Chance. No  endoscopic investigations performed. He was asked to discontinue  ibuprofen and aspirin. He was discharged home with a hemoglobin of 10.6.  He was scheduled for this followup appointment regarding possible  colonoscopy. He has not a  followup CBC since discharge. The patient has  been on iron. He reports to me no further acute bleeding. Still problems  with fatigue.   PAST MEDICAL HISTORY:  1. Hypertension.  2. Diabetes.  3. Depression.   ALLERGIES:  No known drug allergies.   FAMILY HISTORY:  Sister had unspecified type of cancer outside the  colon that was discovered during gastric bypass surgery. Mother with  diabetes.   CURRENT MEDICATIONS:  1. Zinc.  2. Multivitamin.  3. Iron.  4. Potassium.  5. Glipizide 5 mg b.i.d.  6. Avandamet 09/998 b.i.d.  7. Hyzaar unspecified dosage daily.  8. Zoloft 100 mg daily.  9. Xanax 0.5 mg an unspecified number of times per day.   SOCIAL HISTORY:  The patient is married with 3 children. He lives with  his wife. He has a college degree. 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 He does not smoke and occasionally uses  alcohol.   REVIEW OF SYSTEMS:  Per diagnostic evaluation form.   PHYSICAL EXAMINATION:  GENERAL:  Well-appearing male in no acute  distress.  VITAL SIGNS:  Blood pressure 152/70, heart  rate 72 and regular, weight  is 320.6 pounds. He is 6 feet in height.  HEENT:  Sclera are anicteric, conjunctiva are pink, oral mucosa intact,  no adenopathy.  LUNGS:  Clear.  HEART:  Regular.  ABDOMEN:  Obese and soft without tenderness, mass or hernia. Good bowel  sounds heard.  EXTREMITIES:  Without edema.   IMPRESSION:  1. Recent acute lower gastrointestinal bleed almost certainly      diverticular in origin. This problem has resolved.  2. Acute anemia secondary to blood loss.  A Question of more chronic      anemia due to upper gastrointestinal mucosal pathology from chronic      aspirin and nonsteroidal antiinflammatory drug use.  3. Multiple general medical problems including diabetes.   RECOMMENDATIONS:  1. Empiric proton pump inhibitor therapy. Samples have been provided.  2. Continue iron.  3. No need to repeat colonoscopy.   4. Schedule upper endoscopy to exclude gastrointestinal mucosal      abnormalities to account for mild anemia prior to acute      gastrointestinal bleed.  5. Follow up CBC today. CBC today returns with a hemoglobin of 11.4.      I notified the patient.  6. Ongoing general medical care with Dr. Smith Mince.     Wilhemina Bonito. Marina Goodell, MD  Electronically Signed    JNP/MedQ  DD: 08/07/2006  DT: 08/07/2006  Job #: [id number]   cc:   Talmadge Coventry, M.D.

## 2010-11-16 NOTE — H&P (Signed)
NAMEPILOT, PRINDLE NO.:  [id number]   MEDICAL RECORD NO.:  [id number]          PATIENT TYPE:  INP   LOCATION:  0114                         FACILITY:  [facility]   PHYSICIAN:  Michaelyn Barter, M.D. DATE OF BIRTH:  [birth year]   DATE OF ADMISSION:  07/22/2006  DATE OF DISCHARGE:                              HISTORY & PHYSICAL   PRIMARY CARE PHYSICIAN:  Talmadge Coventry, M.D.   CHIEF COMPLAINT:  Rectal bleed.   HISTORY OF PRESENT ILLNESS:  Mr. [name] [name] is a 64 year old gentleman with a  past medical history of diabetes mellitus, hypertension, and hemorrhoids  who noticed that during his bowel movement this past Sunday, which was 2  days ago, he was passing bright red blood from his rectum.  He states  that he passed a significant amount of blood to the point that the water  in the toilet became bright red.  He initially took some Preparation H,  thinking that the bleeding may have been secondary to his hemorrhoids.  However, the bleeding persisted during the course of him having bowel  movements.  Therefore, he called his primary care physician, Dr.  Smith Mince.  Dr. Smith Mince scheduled him for an appointment at 2 o'clock  today.  He went to see her and was examined.  During that time, his  hemoglobin was checked twice.  The first time it was checked, it was  found to be 7.2.  Repeat hemoglobin was found to be 6.8.  He was  subsequently sent to the hospital emergency department for further  evaluation.  The patient currently denies any abdominal pain.  No rectal  pain.  He states that he had been taking 1 aspirin a day until Sunday at  which time he stopped.  Likewise, he admits to taking 2 and sometimes up  to 4 ibuprofen a day secondary to leg pain.  He also states that he has  been taking a Tylenol each day.   PAST MEDICAL HISTORY:  1. The patient had an episode of rectal bleeding less than a year ago,      but it was not as heavy as it is currently.  2. History  of hemorrhoids.  3. Diabetes mellitus.  4. Hypertension.  5. Colonoscopy completed in 2004, done by Dr. Marina Goodell.   PAST SURGICAL HISTORY:  Cosmetic surgery to repair injury to the  patient's chin.   SOCIAL HISTORY:  No cigarettes, alcohol, occasional beer.   HOME MEDICATIONS:  1. Avandamet 4/100 b.i.d.  2. Glipizide 5 mg b.i.d.  3. Aspirin 325 mg daily.  4. Zocor.  5. Xanax.  6. Hyzaar.   FAMILY HISTORY:  Mother has diabetes mellitus.  Father has a history of  hemorrhoids.  One sister had cancer on the outside of her colon.   REVIEW OF SYSTEMS:  As per HPI.  Otherwise, all other systems are  negative.   PHYSICAL EXAMINATION:  GENERAL: The patient looks comfortable.  He is  cooperative, shows no obvious stress.  He is a very pleasant gentleman.  VITAL SIGNS: Temperature 97.8, blood pressure 136/75, heart rate 100,  respirations 20, O2 saturation 90% on room air.  HEENT:  Normocephalic and atraumatic.  Extraocular movements intact.  Anicteric.  Pupils equally react to light.  Oral mucosa pink.  No  thrust, no exudate.  NECK: Supple.  No JVD, no lymphadenopathy, no thyromegaly.  CARDIAC: S1, S2 present.  Regular rate and rhythm.  RESPIRATORY:  Lungs are clear.  No crackles or wheezes.  ABDOMEN:  Soft, nondistended, nontender.  No masses are palpated.  Bowel  sounds are present x4 quadrants.  EXTREMITIES: No leg edema.  NEUROLOGIC:  The patient is alert and oriented x3.  Cranial nerves II-  XII intact.  MUSCULOSKELETAL: 5/5 upper and lower extremity strength.   LABORATORY DATA:  White blood cell count 10.6, hemoglobin 7.7,  hematocrit 22.1, platelets 352.  PT 13.5, INR 1.0, PTT 27.  Sodium 127,  potassium 3.7, chloride 96, CO2 25, glucose 134, BUN 13, creatinine  0.70, total bilirubin 0.7, alkaline phosphatase 43, SGOT 19, SGPT 21,  total protein 5.8, albumin 3.4, calcium 8.3.   ASSESSMENT AND PLAN:  1. Acute onset rectal bleed.  The etiology of this is questionable at       this particular time.  The differential includes bleeding internal      hemorrhoids versus peptic ulcer disease (especially in light of      patient admitting to taking ibuprofen on a daily basis, and it      sounds as if he takes quite a few ibuprofen daily) versus      diverticular bleeding versus angiodysplasia, versus some sort of      arteriovenous malformation, although the patient may be too young      to actually have an arteriovenous malformation.  We will type and      cross the patient for 3 units of packed red blood cells and will      transfuse those now.  Will provide Protonix for the patient.  I      have been informed that [facility] has already been call.  Will      await further instructions from [facility].  We will also monitor      the patient's hemoglobin and hematocrit very closely.  2. Severe anemia. This is secondary to the acute GI bleed the patient      has had.  Again, will type and cross the patient for 3 units of      packed red blood cells. Will cycle patient's hemoglobin and      hematocrit q. 8 h for now.  3. Hyponatremia.  The source of his is questionable. We will provide      the patient with normal saline for now and monitor closely.  4. History of diabetes mellitus.  Will perform Accu-Cheks for now.      Will hold the patient's home medications temporarily until GI      determines whether or not the patient needs a procedure in the near      future out of concern that the patient may become hypoglycemic if      he is made n.p.o.  5. Hypertension.  This currently is stable.      Michaelyn Barter, M.D.  Electronically Signed     OR/MEDQ  D:  07/22/2006  T:  07/22/2006  Job:  [id number]   cc:   Talmadge Coventry, M.D.  Fax: [phone]

## 2010-11-16 NOTE — Discharge Summary (Signed)
NAME:  [name] [name], [name] [name]                  ACCOUNT NO.:  [id number]   MEDICAL RECORD NO.:  [id number]          PATIENT TYPE:  INP   LOCATION:  1424                         FACILITY:  [facility]   PHYSICIAN:  Mobolaji B. Bakare, M.D.DATE OF BIRTH:  04-21-1946   DATE OF ADMISSION:  07/22/2006  DATE OF DISCHARGE:  07/25/2006                               DISCHARGE SUMMARY   PRIMARY CARE PHYSICIAN:  Talmadge Coventry, M.D.   GASTROENTEROLOGIST:  Wilhemina Bonito. Marina Goodell, MD   PRIMARY DIAGNOSIS:  1. GI bleed, likely lower GI.  2. Acute blood loss secondary to #1.  3. Diabetes mellitus.  4. Hypertension.  5. Obesity.   CONSULTATIONS:  GI consults provided by Dr. Lina Sar.   PROCEDURES:  Red blood cell tagged scan.  This was negative for active  bleeding.   BRIEF HISTORY:  Please refer to the admission H&P done by Dr. Michaelyn Barter.  In brief, Mr. [name] [name] is a 65 year old Caucasian male who  presented with rectal bleeding, had large bowel movement which was  bright red blood.  It was unclear if this was mixed with stool or not.  However, he was symptomatic, not feeling well, and he went to Dr.  Stephannie Peters office on the day of admission.  CBC that was done revealed  hemoglobin 6.8, and he was sent to the emergency room.  On arrival in  the emergency room, the patient was normotensive with a blood pressure  of 136/45, pulse 100, otherwise, rest of physical examination was  unremarkable except for pallor.  He was admitted for further evaluation  and blood transfusion.   HOSPITAL COURSE:  #1 - GI BLEED STATUS POST SEVEN UNITS OF PACKED RED  BLOOD CELLS.  This was felt to be lower GI.  The patient did not have  hematemesis or abdominal pain, although, he has been on aspirin and has  been using ibuprofen regularly for pain.  Stool was not previously  melenic, it was more dynamically stable at the time of hospitalization.  He was seen by Gastroenterology, Dr.  Juanda Chance.  He was conservatively  managed.   There was no active bleeding during the course of  hospitalization.  Bowel movements did not have bright red blood, it was  old blood noted on stool.  He was transfused with a total of seven units  of packed red blood cells.  The patient remained hemodynamically stable  during the course of hospitalization.  Decision was made because of  patient's preference to have colonoscopy done as outpatient.  He was  stable enough to be discharged.  Hemoglobin, at the time of discharge,  was 10.6 with hematocrit of 30.   #2 - DIABETES MELLITUS.  The patient was on Avandamet and glipizide  prior to hospitalization.  These were held because of the n.p.o. status  and clear liquid.  We gradually introduced Avandamet.  He is now having  diabetic diet.  He will resume his previous own medication.   #3 - HYPERTENSION.  This was fairly controlled during the course of  hospitalization with the introduction of the Hyzaar.  Blood pressure at  the time of discharge was 154/82.  This will be further optimized by Dr.  Smith Mince.   DISCHARGE LABORATORIES:  Sodium 133, potassium 4.7, chloride 99, CO2 28,  BUN 12, creatinine 0.78, calcium 8.8, hemoglobin 12.6, hematocrit 30.0.   DISCHARGE MEDICATIONS:  1. Iron sulfate one p.o. b.i.d.  2. Avandamet 09/998 p.o. b.i.d.  3. Glipizide 5 mg b.i.d.  4. Hyzaar.  5. Zoloft.   DISCONTINUED MEDICATIONS:  1. Ibuprofen.  2. Aspirin.   RECOMMENDATIONS:  1. Follow up with Dr. Marina Goodell on August 05, 2006, at 1:45 to make      arrangements for colonoscopy.  2. Follow up with Dr. Smith Mince in one week.  3. CBC to be checked in one week.   DISCHARGE INSTRUCTIONS:  The patient was instructed to return to the  emergency room should he develop persistent dizziness and rectal  bleeding recourse.      Mobolaji B. Corky Downs, M.D.  Electronically Signed     MBB/MEDQ  D:  07/25/2006  T:  07/25/2006  Job:  [id number]   cc:   Talmadge Coventry, M.D.  Fax: [phone]   Wilhemina Bonito.  Marina Goodell, MD  [address]  [place]  [place] [address]

## 2010-12-31 ENCOUNTER — Other Ambulatory Visit: Payer: Self-pay | Admitting: Oncology

## 2010-12-31 ENCOUNTER — Encounter (HOSPITAL_BASED_OUTPATIENT_CLINIC_OR_DEPARTMENT_OTHER): Payer: Medicare Other | Admitting: Oncology

## 2010-12-31 DIAGNOSIS — D649 Anemia, unspecified: Secondary | ICD-10-CM

## 2010-12-31 LAB — CBC WITH DIFFERENTIAL/PLATELET
BASO%: 0.3 % (ref 0.0–2.0)
Basophils Absolute: 0 10*3/uL (ref 0.0–0.1)
EOS%: 2.6 % (ref 0.0–7.0)
Eosinophils Absolute: 0.2 10*3/uL (ref 0.0–0.5)
HCT: 34.6 % — ABNORMAL LOW (ref 38.4–49.9)
HGB: 12.1 g/dL — ABNORMAL LOW (ref 13.0–17.1)
LYMPH%: 19.8 % (ref 14.0–49.0)
MCH: 34.2 pg — ABNORMAL HIGH (ref 27.2–33.4)
MCHC: 35 g/dL (ref 32.0–36.0)
MCV: 97.6 fL (ref 79.3–98.0)
MONO#: 0.7 10*3/uL (ref 0.1–0.9)
MONO%: 8.4 % (ref 0.0–14.0)
NEUT#: 6 10*3/uL (ref 1.5–6.5)
NEUT%: 68.9 % (ref 39.0–75.0)
Platelets: 326 10*3/uL (ref 140–400)
RBC: 3.55 10*6/uL — ABNORMAL LOW (ref 4.20–5.82)
RDW: 13.7 % (ref 11.0–14.6)
WBC: 8.7 10*3/uL (ref 4.0–10.3)
lymph#: 1.7 10*3/uL (ref 0.9–3.3)

## 2010-12-31 LAB — IRON AND TIBC
%SAT: 22 % (ref 20–55)
Iron: 77 ug/dL (ref 42–165)
TIBC: 346 ug/dL (ref 215–435)
UIBC: 269 ug/dL

## 2010-12-31 LAB — FERRITIN: Ferritin: 58 ng/mL (ref 22–322)

## 2011-04-08 ENCOUNTER — Encounter: Payer: Self-pay | Admitting: Oncology

## 2011-04-16 ENCOUNTER — Encounter: Payer: Self-pay | Admitting: *Deleted

## 2011-04-21 ENCOUNTER — Encounter: Payer: Self-pay | Admitting: Oncology

## 2011-04-21 DIAGNOSIS — K649 Unspecified hemorrhoids: Secondary | ICD-10-CM | POA: Insufficient documentation

## 2011-05-09 ENCOUNTER — Telehealth: Payer: Self-pay | Admitting: Oncology

## 2011-05-09 ENCOUNTER — Other Ambulatory Visit: Payer: Self-pay

## 2011-05-09 ENCOUNTER — Other Ambulatory Visit (HOSPITAL_BASED_OUTPATIENT_CLINIC_OR_DEPARTMENT_OTHER): Payer: Medicare Other

## 2011-05-09 ENCOUNTER — Other Ambulatory Visit: Payer: Self-pay | Admitting: Oncology

## 2011-05-09 ENCOUNTER — Ambulatory Visit (HOSPITAL_BASED_OUTPATIENT_CLINIC_OR_DEPARTMENT_OTHER): Payer: Medicare Other | Admitting: Oncology

## 2011-05-09 VITALS — BP 142/82 | HR 82 | Temp 97.1°F | Ht 72.0 in | Wt 282.5 lb

## 2011-05-09 DIAGNOSIS — K5733 Diverticulitis of large intestine without perforation or abscess with bleeding: Secondary | ICD-10-CM

## 2011-05-09 DIAGNOSIS — I10 Essential (primary) hypertension: Secondary | ICD-10-CM

## 2011-05-09 DIAGNOSIS — E119 Type 2 diabetes mellitus without complications: Secondary | ICD-10-CM

## 2011-05-09 DIAGNOSIS — D649 Anemia, unspecified: Secondary | ICD-10-CM

## 2011-05-09 LAB — LACTATE DEHYDROGENASE: LDH: 156 U/L (ref 94–250)

## 2011-05-09 LAB — CBC WITH DIFFERENTIAL/PLATELET
BASO%: 0.5 % (ref 0.0–2.0)
Basophils Absolute: 0 10*3/uL (ref 0.0–0.1)
EOS%: 2.4 % (ref 0.0–7.0)
Eosinophils Absolute: 0.2 10*3/uL (ref 0.0–0.5)
HCT: 35.5 % — ABNORMAL LOW (ref 38.4–49.9)
HGB: 12.2 g/dL — ABNORMAL LOW (ref 13.0–17.1)
LYMPH%: 24.4 % (ref 14.0–49.0)
MCH: 33.9 pg — ABNORMAL HIGH (ref 27.2–33.4)
MCHC: 34.4 g/dL (ref 32.0–36.0)
MCV: 98.6 fL — ABNORMAL HIGH (ref 79.3–98.0)
MONO#: 0.9 10*3/uL (ref 0.1–0.9)
MONO%: 10.6 % (ref 0.0–14.0)
NEUT#: 5.3 10*3/uL (ref 1.5–6.5)
NEUT%: 62.1 % (ref 39.0–75.0)
Platelets: 343 10*3/uL (ref 140–400)
RBC: 3.6 10*6/uL — ABNORMAL LOW (ref 4.20–5.82)
RDW: 13.4 % (ref 11.0–14.6)
WBC: 8.6 10*3/uL (ref 4.0–10.3)
lymph#: 2.1 10*3/uL (ref 0.9–3.3)

## 2011-05-09 LAB — COMPREHENSIVE METABOLIC PANEL
ALT: 14 U/L (ref 0–53)
AST: 15 U/L (ref 0–37)
Albumin: 4.4 g/dL (ref 3.5–5.2)
Alkaline Phosphatase: 62 U/L (ref 39–117)
BUN: 15 mg/dL (ref 6–23)
CO2: 26 meq/L (ref 19–32)
Calcium: 9.1 mg/dL (ref 8.4–10.5)
Chloride: 100 mEq/L (ref 96–112)
Creatinine, Ser: 0.76 mg/dL (ref 0.50–1.35)
Glucose, Bld: 129 mg/dL — ABNORMAL HIGH (ref 70–99)
Potassium: 4.5 mEq/L (ref 3.5–5.3)
Sodium: 134 meq/L — ABNORMAL LOW (ref 135–145)
Total Bilirubin: 0.3 mg/dL (ref 0.3–1.2)
Total Protein: 6.8 g/dL (ref 6.0–8.3)

## 2011-05-09 LAB — FERRITIN: Ferritin: 54 ng/mL (ref 22–322)

## 2011-05-09 MED ORDER — INFLUENZA VIRUS VACC SPLIT PF IM SUSP
0.5000 mL | Freq: Once | INTRAMUSCULAR | Status: DC
  Filled 2011-05-09: qty 0.5

## 2011-05-09 NOTE — Progress Notes (Signed)
[facility] OFFICE PROGRESS NOTE  CC:  [name]. [name], MD   DIAGNOSIS:   Intermittent normocytic anemia most likely from chronic diverticulosis.   CURRENT THERAPY:  Watchful observation.   INTERVAL HISTORY: [name] [name] 65 y.o. male returns for regular followup.  He has been trying to lose weight intentionally by putting out more in the last. He reports is feeling much better with losing weight in addition to not having bilateral knee pain. He denies any visible source of bleeding such as epistaxis, hemoptysis, hematemesis, melena, hematuria, skin rash.  In terms of anemia such as chest pain, palpitation, dizziness, severe fatigue.  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   MEDICAL HISTORY: Past Medical History  Diagnosis Date  . Diverticulosis   . Diabetes mellitus   . Normocytic anemia   . Hemorrhoids     ALLERGIES:   has no known allergies.  MEDICATIONS: Current Outpatient Prescriptions  Medication Sig Dispense Refill  . ALPRAZolam (XANAX PO) Take by mouth as needed.       Marland Kitchen amLODipine (NORVASC) 10 MG tablet Take 10 mg by mouth daily.        Marland Kitchen aspirin 81 MG tablet Take 81 mg by mouth daily.        . benazepril (LOTENSIN) 40 MG tablet Take 40 mg by mouth 2 (two) times daily.       . cholecalciferol (VITAMIN D) 1000 UNITS tablet Take 2,000 Units by mouth daily.        Marland Kitchen terazosin (HYTRIN) 10 MG capsule Take 10 mg by mouth at bedtime.        Marland Kitchen zolpidem (AMBIEN) 5 MG tablet Take 5 mg by mouth at bedtime as needed.        . calcium-vitamin D (OSCAL WITH D) 500-200 MG-UNIT per tablet Take 1 tablet by mouth 2 (two) times daily.        . ergocalciferol (VITAMIN D2) 50000 UNITS capsule Take 50,000 Units by mouth once a week.        Marland Kitchen glipiZIDE (GLUCOTROL) 5 MG tablet Take 5 mg by mouth 2 (two) times daily before a meal.        . Rosiglitazone-Metformin (AVANDAMET PO) Take by mouth 2 (two) times daily.          Current Facility-Administered Medications  Medication Dose Route Frequency Provider Last Rate Last Dose  . influenza  inactive virus vaccine (FLUZONE/FLUARIX) injection 0.5 mL  0.5 mL Intramuscular Once Jethro Bolus, MD        REVIEW OF SYSTEMS:  Pertinent items are noted in HPI.   PHYSICAL EXAMINATION: ECOG PERFORMANCE STATUS: 0.  Filed Vitals:   05/09/11 1316  BP: 142/82  Pulse: 82  Temp: 97.1 F (36.2 C)    General:  well-nourished in no acute distress.  Eyes:  no scleral icterus.  ENT:  There were no oropharyngeal lesions.  Neck was without thyromegaly.  Lymphatics:  Negative cervical, supraclavicular or axillary adenopathy.  Respiratory: lungs were clear bilaterally without wheezing or crackles.  Cardiovascular:  Regular rate and rhythm, S1/S2, without murmur, rub or gallop.  There was no pedal edema.  GI:  abdomen was soft, flat, nontender, nondistended, without organomegaly.  Muscoloskeletal:  no spinal tenderness of palpation of vertebral spine.  Skin exam was without echymosis, petichae.  Neuro exam was nonfocal.  Patient was able to get on and off exam table without assistance.  Gait was normal.  Patient was  alerted and oriented.  Attention was good.   Language was appropriate.  Mood was normal without depression.  Speech was not pressured.  Thought content was not tangential.     LABORATORY DATA: Results for orders placed in visit on 05/09/11 (from the past 48 hour(s))  CBC WITH DIFFERENTIAL     Status: Abnormal   Collection Time   05/09/11  1:01 PM      Component Value Range Comment   WBC 8.6  4.0 - 10.3 (10e3/uL)    NEUT# 5.3  1.5 - 6.5 (10e3/uL)    HGB 12.2 (*) 13.0 - 17.1 (g/dL)    HCT 16.1 (*) 09.6 - 49.9 (%)    Platelets 343  140 - 400 (10e3/uL)    MCV 98.6 (*) 79.3 - 98.0 (fL)    MCH 33.9 (*) 27.2 - 33.4 (pg)    MCHC 34.4  32.0 - 36.0 (g/dL)    RBC 0.45 (*) 4.09 - 5.82 (10e6/uL)    RDW 13.4  11.0 - 14.6 (%)    lymph# 2.1  0.9 - 3.3 (10e3/uL)    MONO# 0.9  0.1 -  0.9 (10e3/uL)    Eosinophils Absolute 0.2  0.0 - 0.5 (10e3/uL)    Basophils Absolute 0.0  0.0 - 0.1 (10e3/uL)    NEUT% 62.1  39.0 - 75.0 (%)    LYMPH% 24.4  14.0 - 49.0 (%)    MONO% 10.6  0.0 - 14.0 (%)    EOS% 2.4  0.0 - 7.0 (%)    BASO% 0.5  0.0 - 2.0 (%)      ASSESSMENT AND PLAN:  1. Hypertension:  Good blood pressure control with benazepril.  2. Diabetes mellitus type 2:  He is on glipizide, metformin/rosiglitazone per PCP. 3. Diverticular bleeding:  Not active at this time. 4. Chronic mild normocytic anemia:  Most likely due to chronic diverticular bleeding.  We have been following with his iron panel which has not shown obvious iron deficiency.  His Hgb is rather stable.  I again recommended watchful observation.  He has appointment for lab only in 4 and 8 months.  I will see him in 10 months.  In the future, if he develops significantly worsened anemia, I may consider further evaluation with potential bone marrow biopsy especially if he has pancytopenia.   If he develops significant iron deficiency, I may consider parenteral iron.  Per Dr. Marina Goodell, his next routine surveillance colonoscopy will be in 2021. 5. Primary care:   We discussed the pros and cons of influenza vaccination and he went ahead that with that today.

## 2011-05-09 NOTE — Telephone Encounter (Signed)
gv pt appt schedule for march thru nov 2013.

## 2011-05-10 LAB — IRON AND TIBC
%SAT: 29 % (ref 20–55)
Iron: 87 ug/dL (ref 42–165)
TIBC: 299 ug/dL (ref 215–435)

## 2011-09-03 ENCOUNTER — Telehealth: Payer: Self-pay | Admitting: Oncology

## 2011-09-03 ENCOUNTER — Telehealth: Payer: Self-pay | Admitting: *Deleted

## 2011-09-03 ENCOUNTER — Other Ambulatory Visit: Payer: Self-pay | Admitting: *Deleted

## 2011-09-03 NOTE — Telephone Encounter (Signed)
per otc order for 03/04 cancelled lab appt for 3-06 pt will rtn call to r/s appt

## 2011-09-03 NOTE — Telephone Encounter (Signed)
Opened in error

## 2011-09-03 NOTE — Telephone Encounter (Signed)
Pt called states cannot make lab appt tomorrow d/t wife's illness.  He will call back as soon as he can to r/s.   Sent order to scheduling to cancel lab appt.

## 2011-09-04 ENCOUNTER — Other Ambulatory Visit: Payer: Medicare Other | Admitting: Lab

## 2012-01-15 ENCOUNTER — Other Ambulatory Visit (HOSPITAL_BASED_OUTPATIENT_CLINIC_OR_DEPARTMENT_OTHER): Payer: Medicare Other | Admitting: Lab

## 2012-01-15 DIAGNOSIS — D649 Anemia, unspecified: Secondary | ICD-10-CM

## 2012-01-15 LAB — CBC WITH DIFFERENTIAL/PLATELET
BASO%: 1.1 % (ref 0.0–2.0)
Basophils Absolute: 0.1 10*3/uL (ref 0.0–0.1)
EOS%: 2.5 % (ref 0.0–7.0)
Eosinophils Absolute: 0.2 10*3/uL (ref 0.0–0.5)
HCT: 34.5 % — ABNORMAL LOW (ref 38.4–49.9)
HGB: 11.6 g/dL — ABNORMAL LOW (ref 13.0–17.1)
LYMPH%: 23.5 % (ref 14.0–49.0)
MCH: 33.3 pg (ref 27.2–33.4)
MCHC: 33.7 g/dL (ref 32.0–36.0)
MCV: 98.6 fL — ABNORMAL HIGH (ref 79.3–98.0)
MONO#: 0.8 10*3/uL (ref 0.1–0.9)
MONO%: 9.1 % (ref 0.0–14.0)
NEUT#: 5.4 10*3/uL (ref 1.5–6.5)
NEUT%: 63.8 % (ref 39.0–75.0)
Platelets: 372 10*3/uL (ref 140–400)
RBC: 3.5 10*6/uL — ABNORMAL LOW (ref 4.20–5.82)
RDW: 13.4 % (ref 11.0–14.6)
WBC: 8.4 10*3/uL (ref 4.0–10.3)
lymph#: 2 10*3/uL (ref 0.9–3.3)

## 2012-01-15 LAB — FERRITIN: Ferritin: 26 ng/mL (ref 22–322)

## 2012-01-17 ENCOUNTER — Telehealth: Payer: Self-pay

## 2012-01-17 NOTE — Telephone Encounter (Signed)
Message copied by Kallie Locks on Fri Jan 17, 2012 12:01 PM ------      Message from: HA, Raliegh Ip T      Created: Wed Jan 15, 2012  2:10 PM       Please call pt.  His Hgb has slightly worsened but not too bad.  There is still no indication for transfusion.  Please advise him to continue taking over the counter oral iron BID along with VitC as tolerated.  Thanks.

## 2012-03-24 ENCOUNTER — Telehealth: Payer: Self-pay | Admitting: Oncology

## 2012-03-24 NOTE — Telephone Encounter (Signed)
Called pt and left message regarding appt r/s from 11/4 to 11/6 per md

## 2012-04-07 ENCOUNTER — Telehealth: Payer: Self-pay | Admitting: Oncology

## 2012-04-07 NOTE — Telephone Encounter (Signed)
pt  called to change the appt to an afternoon,done aware     aom

## 2012-05-04 ENCOUNTER — Other Ambulatory Visit: Payer: Medicare Other | Admitting: Lab

## 2012-05-04 ENCOUNTER — Ambulatory Visit: Payer: Medicare Other | Admitting: Oncology

## 2012-05-06 ENCOUNTER — Ambulatory Visit: Payer: Medicare Other | Admitting: Oncology

## 2012-05-06 ENCOUNTER — Other Ambulatory Visit: Payer: Medicare Other | Admitting: Lab

## 2012-05-06 NOTE — Patient Instructions (Addendum)
1. DIAGNOSIS: Intermittent normocytic anemia most likely from chronic diverticulosis.  2. CURRENT THERAPY: Watchful observation.  Continue oral iron as tolerated.  Will continue to monitor your lab about every 6 months.  Return visit in about 2 years.

## 2012-05-07 ENCOUNTER — Other Ambulatory Visit (HOSPITAL_BASED_OUTPATIENT_CLINIC_OR_DEPARTMENT_OTHER): Payer: Medicare Other

## 2012-05-07 ENCOUNTER — Ambulatory Visit (HOSPITAL_BASED_OUTPATIENT_CLINIC_OR_DEPARTMENT_OTHER): Payer: Medicare Other | Admitting: Oncology

## 2012-05-07 VITALS — BP 161/88 | HR 88 | Temp 97.9°F | Resp 20 | Ht 72.0 in | Wt 304.7 lb

## 2012-05-07 DIAGNOSIS — I10 Essential (primary) hypertension: Secondary | ICD-10-CM

## 2012-05-07 DIAGNOSIS — D649 Anemia, unspecified: Secondary | ICD-10-CM

## 2012-05-07 DIAGNOSIS — IMO0002 Reserved for concepts with insufficient information to code with codable children: Secondary | ICD-10-CM

## 2012-05-07 DIAGNOSIS — E119 Type 2 diabetes mellitus without complications: Secondary | ICD-10-CM

## 2012-05-07 DIAGNOSIS — M1710 Unilateral primary osteoarthritis, unspecified knee: Secondary | ICD-10-CM

## 2012-05-07 LAB — COMPREHENSIVE METABOLIC PANEL (CC13)
ALT: 18 U/L (ref 0–55)
AST: 16 U/L (ref 5–34)
Albumin: 4.1 g/dL (ref 3.5–5.0)
Alkaline Phosphatase: 75 U/L (ref 40–150)
BUN: 13 mg/dL (ref 7.0–26.0)
CO2: 26 mEq/L (ref 22–29)
Calcium: 9.4 mg/dL (ref 8.4–10.4)
Chloride: 99 mEq/L (ref 98–107)
Creatinine: 0.8 mg/dL (ref 0.7–1.3)
Glucose: 133 mg/dl — ABNORMAL HIGH (ref 70–99)
Potassium: 4.2 mEq/L (ref 3.5–5.1)
Sodium: 133 mEq/L — ABNORMAL LOW (ref 136–145)
Total Bilirubin: 0.41 mg/dL (ref 0.20–1.20)
Total Protein: 7.4 g/dL (ref 6.4–8.3)

## 2012-05-07 LAB — CBC WITH DIFFERENTIAL/PLATELET
BASO%: 0.7 % (ref 0.0–2.0)
Basophils Absolute: 0.1 10*3/uL (ref 0.0–0.1)
EOS%: 3.2 % (ref 0.0–7.0)
Eosinophils Absolute: 0.3 10*3/uL (ref 0.0–0.5)
HCT: 35.6 % — ABNORMAL LOW (ref 38.4–49.9)
HGB: 12.4 g/dL — ABNORMAL LOW (ref 13.0–17.1)
LYMPH%: 22.1 % (ref 14.0–49.0)
MCH: 34.1 pg — ABNORMAL HIGH (ref 27.2–33.4)
MCHC: 34.9 g/dL (ref 32.0–36.0)
MCV: 97.7 fL (ref 79.3–98.0)
MONO#: 0.9 10*3/uL (ref 0.1–0.9)
MONO%: 9.1 % (ref 0.0–14.0)
NEUT#: 6.4 10*3/uL (ref 1.5–6.5)
NEUT%: 64.9 % (ref 39.0–75.0)
Platelets: 388 10*3/uL (ref 140–400)
RBC: 3.64 10*6/uL — ABNORMAL LOW (ref 4.20–5.82)
RDW: 13.4 % (ref 11.0–14.6)
WBC: 9.9 10*3/uL (ref 4.0–10.3)
lymph#: 2.2 10*3/uL (ref 0.9–3.3)

## 2012-05-07 LAB — IRON AND TIBC
%SAT: 25 % (ref 20–55)
Iron: 77 ug/dL (ref 42–165)
TIBC: 311 ug/dL (ref 215–435)
UIBC: 234 ug/dL (ref 125–400)

## 2012-05-07 LAB — FERRITIN: Ferritin: 50 ng/mL (ref 22–322)

## 2012-05-07 NOTE — Progress Notes (Signed)
[facility]  Telephone:([phone]) [phone] [phone]) [phone]   OFFICE PROGRESS NOTE   Cc:  No primary provider on file.  DIAGNOSIS:  Chronic mild normocytic anemia; most likely from diverticulosis.   CURRENT THERAPY:  Watchful observation.   INTERVAL HISTORY: [name] [name] 66 y.o. male returns for regular follow up by himself.  He has moderate to severe bilateral knee pain due to OA.  He has had injection and therapy.  He is planning for knee surgery soon.  With respect to anemia, he denied visible source of bleeding.  He denied fever, SOB, chest pain, dizziness.   Past Medical History  Diagnosis Date  . Diverticulosis   . Diabetes mellitus   . Normocytic anemia   . Hemorrhoids     No past surgical history on file.  Current Outpatient Prescriptions  Medication Sig Dispense Refill  . ALPRAZolam (XANAX PO) Take 1 mg by mouth 2 (two) times daily.       Marland Kitchen amLODipine (NORVASC) 10 MG tablet Take 10 mg by mouth daily.        Marland Kitchen aspirin 81 MG tablet Take 81 mg by mouth daily.        . benazepril (LOTENSIN) 40 MG tablet Take 40 mg by mouth 2 (two) times daily.       . pioglitazone-metformin (ACTOPLUS MET) 15-850 MG per tablet Take 1 tablet by mouth 2 (two) times daily with a meal.        . terazosin (HYTRIN) 10 MG capsule Take 10 mg by mouth at bedtime.        . Vitamin D, Ergocalciferol, (DRISDOL) 50000 UNITS CAPS Take 50,000 Units by mouth every 7 (seven) days.      Marland Kitchen zolpidem (AMBIEN) 5 MG tablet Take 5 mg by mouth at bedtime as needed.          ALLERGIES:   has no known allergies.  REVIEW OF SYSTEMS:  The rest of the 14-point review of system was negative.   Filed Vitals:   05/07/12 1442  BP: 161/88  Pulse: 88  Temp: 97.9 F (36.6 C)  Resp: 20   Wt Readings from Last 3 Encounters:  05/07/12 304 lb 11.2 oz (138.211 kg)  05/09/11 282 lb 8 oz (128.141 kg)  10/03/10 293 lb 1.6 oz (132.949 kg)   ECOG Performance status: 1 due to bilateral knee pain.   PHYSICAL  EXAMINATION:   General:  well-nourished man, in no acute distress.  Eyes:  no scleral icterus.  ENT:  There were no oropharyngeal lesions.  Neck was without thyromegaly.  Lymphatics:  Negative cervical, supraclavicular or axillary adenopathy.  Respiratory: lungs were clear bilaterally without wheezing or crackles.  Cardiovascular:  Regular rate and rhythm, S1/S2, without murmur, rub or gallop.  There was no pedal edema.  GI:  abdomen was soft, flat, nontender, nondistended, without organomegaly.  Muscoloskeletal:  no spinal tenderness of palpation of vertebral spine.  Skin exam was without echymosis, petichae.  Neuro exam was nonfocal.  Patient was able to get on and off exam table without assistance.  Gait was normal.  Patient was alerted and oriented.  Attention was good.   Language was appropriate.  Mood was normal without depression.  Speech was not pressured.  Thought content was not tangential.      LABORATORY/RADIOLOGY DATA:  Lab Results  Component Value Date   WBC 9.9 05/07/2012   HGB 12.4* 05/07/2012   HCT 35.6* 05/07/2012   PLT 388 05/07/2012   GLUCOSE 133*  05/07/2012   ALKPHOS 75 05/07/2012   ALT 18 05/07/2012   AST 16 05/07/2012   NA 133* 05/07/2012   K 4.2 05/07/2012   CL 99 05/07/2012   CREATININE 0.8 05/07/2012   BUN 13.0 05/07/2012   CO2 26 05/07/2012    ASSESSMENT AND PLAN:   1. Hypertension: he is on amlodipine and benazepril.  2. Diabetes mellitus type 2: He is on metformin/pioglitazone per PCP. 3. BPH:  S/p ablation procedure by Urology.  He is on terazosin.  4. Diverticular bleeding: Not active at this time. 5. Chronic mild normocytic anemia: Most likely due to chronic diverticular bleeding. We have been following with his iron panel which has not shown obvious iron deficiency. His Hgb is rather stable. I again recommended watchful observation. He has appointment for lab only every 6 months.   I will see him in 2 years. In the future, if he develops significantly worsened  anemia, I may consider further evaluation with potential bone marrow biopsy especially if he has pancytopenia. If he develops significant iron deficiency, I may consider parenteral iron. Per Dr. Marina Goodell, his next routine surveillance colonoscopy will be in 2021. 6. Severe bilateral knee osteoarthritis:  From hematology standpoint, there is no contraindication for knee surgery.      The length of time of the face-to-face encounter was 10 minutes. More than 50% of time was spent counseling and coordination of care.

## 2012-05-08 ENCOUNTER — Telehealth: Payer: Self-pay | Admitting: Oncology

## 2012-05-08 NOTE — Telephone Encounter (Signed)
Could not schedule pt for est appt with curcio in 71yrs...schedule not available yet.

## 2012-05-08 NOTE — Telephone Encounter (Signed)
s.w. pts wife and advised her on his May 2014 appt and to come pick up schedule for pt.

## 2012-07-01 HISTORY — PX: TOTAL KNEE ARTHROPLASTY: SHX125

## 2012-07-01 HISTORY — PX: JOINT REPLACEMENT: SHX530

## 2012-07-13 ENCOUNTER — Ambulatory Visit: Payer: Self-pay | Admitting: General Practice

## 2012-07-13 LAB — URINALYSIS, COMPLETE
Bacteria: NONE SEEN
Bilirubin,UR: NEGATIVE
Blood: NEGATIVE
Glucose,UR: NEGATIVE mg/dL (ref 0–75)
Ketone: NEGATIVE
Leukocyte Esterase: NEGATIVE
Nitrite: NEGATIVE
Ph: 7 (ref 4.5–8.0)
Protein: NEGATIVE
RBC,UR: 1 /HPF (ref 0–5)
Specific Gravity: 1.005 (ref 1.003–1.030)
Squamous Epithelial: NONE SEEN
WBC UR: NONE SEEN /HPF (ref 0–5)

## 2012-07-13 LAB — BASIC METABOLIC PANEL
Anion Gap: 5 — ABNORMAL LOW (ref 7–16)
BUN: 13 mg/dL (ref 7–18)
Calcium, Total: 8.4 mg/dL — ABNORMAL LOW (ref 8.5–10.1)
Chloride: 102 mmol/L (ref 98–107)
Co2: 29 mmol/L (ref 21–32)
Creatinine: 0.83 mg/dL (ref 0.60–1.30)
EGFR (African American): >60
EGFR (Non-African Amer.): >60
Glucose: 131 mg/dL — ABNORMAL HIGH (ref 65–99)
Osmolality: 274 (ref 275–301)
Potassium: 4.1 mmol/L (ref 3.5–5.1)
Sodium: 136 mmol/L (ref 136–145)

## 2012-07-13 LAB — CBC
HCT: 35.4 % — ABNORMAL LOW (ref 40.0–52.0)
HGB: 11.9 g/dL — ABNORMAL LOW (ref 13.0–18.0)
MCH: 32.9 pg (ref 26.0–34.0)
MCHC: 33.5 g/dL (ref 32.0–36.0)
MCV: 98 fL (ref 80–100)
Platelet: 385 10*3/uL (ref 150–440)
RBC: 3.6 10*6/uL — ABNORMAL LOW (ref 4.40–5.90)
RDW: 13.1 % (ref 11.5–14.5)
WBC: 6.4 10*3/uL (ref 3.8–10.6)

## 2012-07-13 LAB — SEDIMENTATION RATE: Erythrocyte Sed Rate: 13 mm/hr (ref 0–20)

## 2012-07-13 LAB — APTT: Activated PTT: 28.5 secs (ref 23.6–35.9)

## 2012-07-13 LAB — PROTIME-INR
INR: 0.9
Prothrombin Time: 12.2 s (ref 11.5–14.7)

## 2012-07-13 LAB — MRSA PCR SCREENING

## 2012-07-14 LAB — URINE CULTURE

## 2012-07-27 ENCOUNTER — Inpatient Hospital Stay: Payer: Self-pay | Admitting: General Practice

## 2012-07-28 LAB — BASIC METABOLIC PANEL
Anion Gap: 9 (ref 7–16)
BUN: 12 mg/dL (ref 7–18)
Calcium, Total: 7.7 mg/dL — ABNORMAL LOW (ref 8.5–10.1)
Chloride: 97 mmol/L — ABNORMAL LOW (ref 98–107)
Co2: 25 mmol/L (ref 21–32)
Creatinine: 0.74 mg/dL (ref 0.60–1.30)
EGFR (African American): >60
EGFR (Non-African Amer.): >60
Glucose: 161 mg/dL — ABNORMAL HIGH (ref 65–99)
Osmolality: 266 (ref 275–301)
Potassium: 4.3 mmol/L (ref 3.5–5.1)
Sodium: 131 mmol/L — ABNORMAL LOW (ref 136–145)

## 2012-07-28 LAB — PLATELET COUNT: Platelet: 297 10*3/uL (ref 150–440)

## 2012-07-28 LAB — HEMOGLOBIN: HGB: 9.6 g/dL — ABNORMAL LOW (ref 13.0–18.0)

## 2012-07-29 LAB — BASIC METABOLIC PANEL
Anion Gap: 8 (ref 7–16)
BUN: 10 mg/dL (ref 7–18)
Calcium, Total: 7.8 mg/dL — ABNORMAL LOW (ref 8.5–10.1)
Chloride: 101 mmol/L (ref 98–107)
Co2: 26 mmol/L (ref 21–32)
Creatinine: 0.7 mg/dL (ref 0.60–1.30)
EGFR (African American): >60
EGFR (Non-African Amer.): >60
Glucose: 143 mg/dL — ABNORMAL HIGH (ref 65–99)
Osmolality: 272 (ref 275–301)
Potassium: 4.2 mmol/L (ref 3.5–5.1)
Sodium: 135 mmol/L — ABNORMAL LOW (ref 136–145)

## 2012-07-29 LAB — HEMOGLOBIN: HGB: 9.1 g/dL — ABNORMAL LOW (ref 13.0–18.0)

## 2012-07-29 LAB — PLATELET COUNT: Platelet: 303 10*3/uL (ref 150–440)

## 2012-09-01 ENCOUNTER — Telehealth: Payer: Self-pay | Admitting: Oncology

## 2012-09-01 NOTE — Telephone Encounter (Signed)
pt is having knee replacent and needs to r/s .Marland KitchenMarland KitchenMarland KitchenDone

## 2012-10-13 ENCOUNTER — Ambulatory Visit: Payer: Self-pay | Admitting: General Practice

## 2012-10-13 LAB — BASIC METABOLIC PANEL
Anion Gap: 3 — ABNORMAL LOW (ref 7–16)
BUN: 15 mg/dL (ref 7–18)
Calcium, Total: 8.7 mg/dL (ref 8.5–10.1)
Chloride: 101 mmol/L (ref 98–107)
Co2: 29 mmol/L (ref 21–32)
Creatinine: 0.73 mg/dL (ref 0.60–1.30)
EGFR (African American): >60
EGFR (Non-African Amer.): >60
Glucose: 149 mg/dL — ABNORMAL HIGH (ref 65–99)
Osmolality: 270 (ref 275–301)
Potassium: 4.1 mmol/L (ref 3.5–5.1)
Sodium: 133 mmol/L — ABNORMAL LOW (ref 136–145)

## 2012-10-13 LAB — URINALYSIS, COMPLETE
Bacteria: NONE SEEN
Bilirubin,UR: NEGATIVE
Blood: NEGATIVE
Glucose,UR: NEGATIVE mg/dL (ref 0–75)
Ketone: NEGATIVE
Leukocyte Esterase: NEGATIVE
Nitrite: NEGATIVE
Ph: 7 (ref 4.5–8.0)
Protein: NEGATIVE
RBC,UR: <1 /[HPF] (ref 0–5)
Specific Gravity: 1.005 (ref 1.003–1.030)
Squamous Epithelial: NONE SEEN
WBC UR: 1 /HPF (ref 0–5)

## 2012-10-13 LAB — CBC
HCT: 33.7 % — ABNORMAL LOW (ref 40.0–52.0)
HGB: 11.5 g/dL — ABNORMAL LOW (ref 13.0–18.0)
MCH: 32.5 pg (ref 26.0–34.0)
MCHC: 34 g/dL (ref 32.0–36.0)
MCV: 96 fL (ref 80–100)
Platelet: 416 10*3/uL (ref 150–440)
RBC: 3.52 10*6/uL — ABNORMAL LOW (ref 4.40–5.90)
RDW: 13.8 % (ref 11.5–14.5)
WBC: 8 10*3/uL (ref 3.8–10.6)

## 2012-10-13 LAB — PROTIME-INR
INR: 1
Prothrombin Time: 13.3 secs (ref 11.5–14.7)

## 2012-10-13 LAB — MRSA PCR SCREENING

## 2012-10-13 LAB — APTT: Activated PTT: 28.4 secs (ref 23.6–35.9)

## 2012-10-13 LAB — SEDIMENTATION RATE: Erythrocyte Sed Rate: 15 mm/hr (ref 0–20)

## 2012-10-15 LAB — URINE CULTURE

## 2012-10-26 ENCOUNTER — Inpatient Hospital Stay: Payer: Self-pay | Admitting: General Practice

## 2012-10-27 LAB — BASIC METABOLIC PANEL
Anion Gap: 8 (ref 7–16)
BUN: 14 mg/dL (ref 7–18)
Calcium, Total: 7.8 mg/dL — ABNORMAL LOW (ref 8.5–10.1)
Chloride: 100 mmol/L (ref 98–107)
Co2: 24 mmol/L (ref 21–32)
Creatinine: 0.55 mg/dL — ABNORMAL LOW (ref 0.60–1.30)
EGFR (African American): >60
EGFR (Non-African Amer.): >60
Glucose: 194 mg/dL — ABNORMAL HIGH (ref 65–99)
Osmolality: 270 (ref 275–301)
Potassium: 4 mmol/L (ref 3.5–5.1)
Sodium: 132 mmol/L — ABNORMAL LOW (ref 136–145)

## 2012-10-27 LAB — PLATELET COUNT: Platelet: 318 10*3/uL (ref 150–440)

## 2012-10-27 LAB — HEMOGLOBIN: HGB: 10.7 g/dL — ABNORMAL LOW (ref 13.0–18.0)

## 2012-10-28 LAB — BASIC METABOLIC PANEL
Anion Gap: 7 (ref 7–16)
BUN: 13 mg/dL (ref 7–18)
Calcium, Total: 8 mg/dL — ABNORMAL LOW (ref 8.5–10.1)
Chloride: 96 mmol/L — ABNORMAL LOW (ref 98–107)
Co2: 25 mmol/L (ref 21–32)
Creatinine: 0.81 mg/dL (ref 0.60–1.30)
EGFR (African American): >60
EGFR (Non-African Amer.): >60
Glucose: 254 mg/dL — ABNORMAL HIGH (ref 65–99)
Osmolality: 266 (ref 275–301)
Potassium: 3.9 mmol/L (ref 3.5–5.1)
Sodium: 128 mmol/L — ABNORMAL LOW (ref 136–145)

## 2012-10-28 LAB — HEMOGLOBIN: HGB: 9.5 g/dL — ABNORMAL LOW (ref 13.0–18.0)

## 2012-11-05 ENCOUNTER — Other Ambulatory Visit: Payer: Medicare Other | Admitting: Lab

## 2012-12-31 ENCOUNTER — Other Ambulatory Visit (HOSPITAL_BASED_OUTPATIENT_CLINIC_OR_DEPARTMENT_OTHER): Payer: Medicare Other

## 2012-12-31 DIAGNOSIS — D649 Anemia, unspecified: Secondary | ICD-10-CM

## 2012-12-31 LAB — CBC WITH DIFFERENTIAL/PLATELET
BASO%: 0.6 % (ref 0.0–2.0)
Basophils Absolute: 0 10*3/uL (ref 0.0–0.1)
EOS%: 3.3 % (ref 0.0–7.0)
Eosinophils Absolute: 0.2 10*3/uL (ref 0.0–0.5)
HCT: 32.8 % — ABNORMAL LOW (ref 38.4–49.9)
HGB: 11 g/dL — ABNORMAL LOW (ref 13.0–17.1)
LYMPH%: 18 % (ref 14.0–49.0)
MCH: 30.6 pg (ref 27.2–33.4)
MCHC: 33.5 g/dL (ref 32.0–36.0)
MCV: 91.1 fL (ref 79.3–98.0)
MONO#: 0.8 10*3/uL (ref 0.1–0.9)
MONO%: 10.7 % (ref 0.0–14.0)
NEUT#: 4.9 10*3/uL (ref 1.5–6.5)
NEUT%: 67.4 % (ref 39.0–75.0)
Platelets: 330 10*3/uL (ref 140–400)
RBC: 3.6 10*6/uL — ABNORMAL LOW (ref 4.20–5.82)
RDW: 13.8 % (ref 11.0–14.6)
WBC: 7.2 10*3/uL (ref 4.0–10.3)
lymph#: 1.3 10*3/uL (ref 0.9–3.3)

## 2013-01-07 ENCOUNTER — Encounter: Payer: Self-pay | Admitting: Oncology

## 2013-05-06 ENCOUNTER — Telehealth: Payer: Self-pay | Admitting: Hematology and Oncology

## 2013-05-06 ENCOUNTER — Other Ambulatory Visit: Payer: Self-pay | Admitting: Hematology and Oncology

## 2013-05-06 DIAGNOSIS — D649 Anemia, unspecified: Secondary | ICD-10-CM

## 2013-05-06 NOTE — Telephone Encounter (Signed)
Called pt and left message for lab and MD on 11/7 per Dr. Bertis Ruddy

## 2013-05-07 ENCOUNTER — Other Ambulatory Visit: Payer: Medicare Other

## 2013-05-07 ENCOUNTER — Ambulatory Visit: Payer: Medicare Other | Admitting: Hematology and Oncology

## 2013-05-10 ENCOUNTER — Telehealth: Payer: Self-pay | Admitting: Hematology and Oncology

## 2013-05-10 NOTE — Telephone Encounter (Signed)
Faxed pt medical records to [facility]. They will call pt with appt.

## 2013-05-11 ENCOUNTER — Telehealth: Payer: Self-pay | Admitting: Hematology and Oncology

## 2013-05-11 NOTE — Telephone Encounter (Signed)
Pt appt. To see Dr. Sherrlyn Hock is 05/18/13@11 :00. Medical records faxed.  Pt is aware

## 2013-05-25 ENCOUNTER — Ambulatory Visit: Payer: Self-pay | Admitting: Internal Medicine

## 2013-05-25 LAB — CBC CANCER CENTER
Basophil: 2 %
Eosinophil: 3 %
HCT: 35 % — ABNORMAL LOW (ref 40.0–52.0)
HGB: 12.1 g/dL — ABNORMAL LOW (ref 13.0–18.0)
Lymphocytes: 19 %
MCH: 33 pg (ref 26.0–34.0)
MCHC: 34.6 g/dL (ref 32.0–36.0)
MCV: 95 fL (ref 80–100)
Monocytes: 8 %
Platelet: 397 x10 3/mm (ref 150–440)
RBC: 3.67 10*6/uL — ABNORMAL LOW (ref 4.40–5.90)
RDW: 13.5 % (ref 11.5–14.5)
Segmented Neutrophils: 67 %
Variant Lymphocyte: 1 %
WBC: 8.2 x10 3/mm (ref 3.8–10.6)

## 2013-05-25 LAB — IRON AND TIBC
Iron Bind.Cap.(Total): 396 ug/dL (ref 250–450)
Iron Saturation: 12 %
Iron: 48 ug/dL — ABNORMAL LOW (ref 65–175)
Unbound Iron-Bind.Cap.: 348 ug/dL

## 2013-05-25 LAB — RETICULOCYTES
Absolute Retic Count: 0.0674 10*6/uL (ref 0.019–0.186)
Reticulocyte: 1.84 % (ref 0.4–3.1)

## 2013-05-25 LAB — FERRITIN: Ferritin (ARMC): 27 ng/mL (ref 8–388)

## 2013-05-25 LAB — FOLATE: Folic Acid: 15.4 ng/mL (ref 3.1–100.0)

## 2013-05-31 ENCOUNTER — Ambulatory Visit: Payer: Self-pay | Admitting: Internal Medicine

## 2013-06-09 ENCOUNTER — Telehealth: Payer: Self-pay | Admitting: Internal Medicine

## 2013-06-09 NOTE — Telephone Encounter (Signed)
Patient needs to schedule a routine office appointment to see me. If not already, make sure we have relevant records. Thank you

## 2013-06-09 NOTE — Telephone Encounter (Signed)
Dr. Sherrlyn Hock is seeing this pt in [place] at the cancer center for iron def. Anemia. Dr. Sherrlyn Hock wants to know if Dr. Marina Goodell thinks the pt needs to be seen and have and endoscopy done. Requested they fax over the office note from Dr. Sherrlyn Hock so Dr. Marina Goodell and review the records. Records placed on Dr. Lamar Sprinkles desk.

## 2013-06-09 NOTE — Telephone Encounter (Signed)
Pt scheduled for OV with Dr. Marina Goodell 07/23/13@3pm  per pt request. Pt aware of appt.

## 2013-06-16 ENCOUNTER — Encounter: Payer: Self-pay | Admitting: Internal Medicine

## 2013-07-16 ENCOUNTER — Ambulatory Visit: Payer: Medicare Other | Admitting: Internal Medicine

## 2013-07-23 ENCOUNTER — Ambulatory Visit: Payer: Medicare Other | Admitting: Internal Medicine

## 2013-09-21 ENCOUNTER — Ambulatory Visit: Payer: Self-pay | Admitting: Internal Medicine

## 2013-11-05 ENCOUNTER — Other Ambulatory Visit: Payer: Medicare Other | Admitting: Lab

## 2014-10-21 NOTE — Discharge Summary (Signed)
PATIENT NAME:  [name] [name], [name] [name] MR#:  [id number] DATE OF BIRTH:  [birth year]  DATE OF ADMISSION:  07/27/2012 DATE OF DISCHARGE:  07/31/2012  ADMITTING DIAGNOSIS:  Degenerative arthrosis of the right knee.   DISCHARGE DIAGNOSIS:  Degenerative arthrosis of the right knee.   HISTORY: The patient is a 69 year old white male who has been followed at [facility] for progression of knee pain. He has had at least a 3-year history of bilateral knee pain with the right being more symptomatic than the left. His pain was aggravated with weight-bearing activities. He reported start-up stiffness as well as activity-related swelling. He had not seen any significant improvement in his condition despite intra-articular cortisone injections as well as Synvisc injections. He attempted to continue to exercise, but was having difficulty, but was able to so aquatic exercise. His knee pain had progressed to the point that it was significantly interfering with his activities of daily living. He, however, had tried to continue working part-time. X-rays taken in the [place] showed narrowing of the medial cartilage space with associated varus alignment. He was noted to have osteophyte as well as subchondral sclerosis. After discussion of the risks and benefits of surgical intervention, the patient expressed his understanding of the risks and benefits and agreed for plans for surgical intervention.   PROCEDURE: Right total knee arthroplasty using computer-assisted navigation anesthesia. Anesthesia: Femoral nerve block with spinal. Soft tissue release: Anterior cruciate ligament, posterior cruciate ligament, deep medial collateral ligaments as well as the patellofemoral ligament.   IMPLANTS UTILIZED: DePuy PFC Sigma size 5 posterior stabilized femoral component (cemented), size 5 MBT tibial component (cemented), 38 mm 3-pegged oval dome patella (cemented), and a 10 mm stabilized rotating platform  polyethylene insert.   HOSPITAL COURSE: The patient tolerated the procedure very well. He had no complications. He was then taken to the PACU where he was stabilized and then transferred to the orthopedic floor. The patient began receiving anticoagulation therapy of Lovenox 30 mg subcutaneous every 12 hours per anesthesia and pharmacy protocol. He was fitted with TED stockings bilaterally. These were allowed to be removed 1 hour per 8-hour shift. The right one was applied on day 2 following removal of the Hemovac and dressing change. The patient was also fitted with the AV-I compression foot pumps bilaterally set at 80 mmHg. His calves have been nontender. There has been no evidence of any DVT to the lower extremities. Heels were elevated off the bed using rolled towels.   The patient has denied chest pain or shortness of breath. Vital signs have been stable. He has been afebrile. Hemodynamically, he was stable and no transfusions were given other than the Autovac transfusions given the first 6 hours. The patient was noted to have quite a bit of anxiety throughout the hospital course. He takes 1 Xanax at home but was very fidgety in bed and did a lot of tossing and turning and just vocalization.    Physical therapy was initiated on day 1 for gait training and transfers. This was a little bit on the slow side due to his size as well having bilateral knee pain. This was also compounded because of his anxiety. However, he did improve, and upon being discharged he was ambulating greater than 200 feet. He was able go up and down 4 sets of steps. He was independent with bed-to-chair   transfers. Occupational therapy was also initiated on day 1 for activities of daily living and assistive devices.   The patient's IV,  Foley and Hemovac were discontinued on day 2, along with a dressing change. The Polar Care was reapplied to the surgical leg to maintain a temperature of 40 to 50 degrees Fahrenheit. His wound has been  free of any drainage or any signs of infection.   DISPOSITION: The patient is discharged to home in improved stable condition. He may continue weight-bearing as tolerated.  Continue with TED stockings. These are to be worn during the day but may be removed at night. He is to continue with the walker until cleared by Physical Therapy to go to a quad cane. He will receive Home Health physical therapy. He has a follow-up appointment in the clinic on February 11th at 10:15. He is to call the Clinic sooner if any temperatures of 101.5 or greater or excessive bleeding. He is placed on a regular diet.   DISCHARGE MEDICATIONS: He is to resume his regular medication that he was on prior to admission. He was given 3 prescriptions, Roxicodone 1 to 2 tablets every 4 to 6 hours p.r.n., tramadol 50 to 100 mg every 4 to 6 hours p.r.n. and Lovenox 40 mg subcutaneously daily for 14 days, then discontinue and begin taking one 81 mg enteric-coated aspirin.       PAST MEDICAL HISTORY: Hypertension, cataracts, diabetes, diverticulosis, mild anemia, anxiety.   ____________________________ Vance Peper, PA jrw:cb D: 07/30/2012 07:36:57 ET T: 07/30/2012 10:23:28 ET JOB#: [id number]  cc: Vance Peper, PA, <Dictator> Narayan Scull PA ELECTRONICALLY SIGNED 08/02/2012 18:15

## 2014-10-21 NOTE — Discharge Summary (Signed)
PATIENT NAME:  [name] [name], [name] [name] MR#:  [id number] DATE OF BIRTH:  Jul 15, 1945  Dictated for Skip Estimable, MD  DATE OF ADMISSION:  10/26/2012 DATE OF DISCHARGE:  10/29/2012  ADMITTING DIAGNOSIS: Degenerative arthrosis of the left knee.   DISCHARGE DIAGNOSIS: Degenerative arthrosis of the left knee.   HISTORY OF PRESENT ILLNESS: The patient had previously undergone a right total knee arthroplasty on 07/27/2012 and did extremely well. He was having no problems, but continued to have discomfort with the left knee. He states that he is having problems with everyday activities. He had continued to work. Was having difficulty with this, and was noted to have increased swelling the more he was on his feet. His pain was also noted to be diffusely to the knee and was gradually getting worse. He was having problems with going up and down steps. The pain was noted to be worse with increased weightbearing activities. He was also complaining of start-up stiffness. The patient had not seen any significant improvement in his condition despite intraarticular cortisone injections as well as Synvisc injections. He has attempted an exercise program primarily involving aquatic exercises, but has been somewhat limited because of his severity of pain.   X-rays taken in the clinic showed tricompartmental degenerative changes. He was noted to have bone-on-bone to the medial compartment space, with near bone-on-bone to the lateral compartment space. He was noted to have significant osteophyte formation. The patella appeared to be tracking well. There was some lateral translation of the tibia and compression with the femur. After discussion of the risks and benefits of surgical intervention, the patient expressed his understanding of the risks and benefits and agreed for plans for surgical intervention.   HOSPITAL COURSE:  PROCEDURE: Left total knee arthroplasty using computer-assisted navigation.   ANESTHESIA: Femoral nerve block,  with spinal.   Anterior cruciate ligament, posterior cruciate ligament, deep medial collateral ligament, as well as patellofemoral ligament.   IMPLANTS UTILIZED: DePuy PFC Sigma size 4 posterior-stabilized femoral component (cemented), size 4 MBT tibial component (cemented), a 38 mm 3-pegged oval-dome patella (cemented), and a 15 mm stabilized rotating platform polyethylene insert.   The patient tolerated the procedure very well. He had no complications. He was then taken to the PACU where he was stabilized and then transferred to the orthopedic floor. The patient began receiving anticoagulation therapy of Lovenox 40 mg subQ q.12 hours per anesthesia and pharmacy protocol. He was fitted with TED stockings bilaterally. These were allowed to be removed 1 hour per 8 hour shift. The left one was applied on day 2 following removal of the Hemovac and dressing change. The wound has been free of any signs of infection. The patient was also fitted with the AVI compression foot pumps set at 80 mmHg bilaterally. His calves have been nontender. There has been no evidence of any DVTs to the lower extremity. Heels were elevated off the bed using rolled towels.   Physical therapy was initiated on day 1 for gait training and transfers. He has done extremely well. On day 2 he was ambulating more than 250 feet.   At discharge he was able go up and down 4 sets of steps. He was get up and down, out of bed independently without any problems. Occupational therapy was also initiated on day 1 for ADLs and assistive devices. He has  progressed extremely well.   The patient's IV, Foley and Hemovac were DC'd on day 2, along with his dressing changes. The Polar Care was reapplied to the  surgical leg, maintaining a temperature of 40 to 50 degrees Fahrenheit.   The patient is discharged to home in improved, stable condition.   DISCHARGE INSTRUCTIONS: 1.  He is to continue weight-bearing as tolerated. Continue using a walker  until cleared by physical therapy to go to a quad cane. He will receive home health PT. Continue TED stockings. These are to be worn during the day, but may be removed at night. He is to continue with the Polar Care, maintaining a temperature of 40 to 50 degrees Fahrenheit. Recommend he use this pretty much around-the-clock for the first 24 hours.  2.  He is placed on an ADA diet.  3.  He has a followup appointment in the clinic in 2 weeks. He is to call the clinic sooner if any temperatures of 101.5 or greater, or excessive bleeding. He was instructed on wound care. He is to resume his regular medication that he was on prior to admission. He was given a prescription for oxycodone 5 to 10 mg q. 4 to 6 hours p.r.n. for pain, Ultram 50 to 100 mg q. 4 to 6 hours p.r.n. for pain, and Lovenox 40 mg subQ q. daily for 14 days, then discontinue and begin taking one 81 mg enteric-coated aspirin.   PAST MEDICAL HISTORY: Hypertension, cataracts, diabetes, diverticulosis mild anemia, chronic hyponatremia.     ____________________________ Vance Peper, PA jrw:dm D: 10/29/2012 07:35:07 ET T: 10/29/2012 07:53:09 ET JOB#: [id number]  cc: Vance Peper, PA, <Dictator>   PA ELECTRONICALLY SIGNED 10/29/2012 22:33

## 2014-10-21 NOTE — Op Note (Signed)
PATIENT NAME:  [name] [name], [name] [name] MR#:  [id number] DATE OF BIRTH:  1945-07-30  DATE OF PROCEDURE:  10/26/2012  PREOPERATIVE DIAGNOSIS: Degenerative arthrosis of the left knee.   POSTOPERATIVE DIAGNOSIS: Degenerative arthrosis of the left knee.   PROCEDURE PERFORMED:  Left total knee arthroplasty using computer-assisted navigation.   SURGEON: Laurice Record. Holley Bouche., MD   ASSISTANT: Vance Peper, PA   ANESTHESIA: Femoral nerve block and spinal.   ESTIMATED BLOOD LOSS: 50 mL.   FLUIDS REPLACED: 1800 mL of crystalloid.   TOURNIQUET TIME: 90 minutes.   DRAINS: Two medium drains to reinfusion system.   SOFT TISSUE RELEASES: Anterior cruciate ligament, posterior cruciate ligament, deep medial collateral ligament and patellofemoral ligament.   IMPLANTS UTILIZED: DePuy PFC Sigma size 4 posterior stabilized femoral component (cemented), size 4 MBT tibial component (cemented), a 38 mm three-peg oval dome patella (cemented), and a 15 mm stabilized rotating platform polyethylene insert.   INDICATIONS FOR SURGERY: The patient is a 69 year old male who has been seen for complaints of bilateral knee pain. He previously underwent right total knee arthroplasty with excellent results. He has continued to have progression in his left knee pain. X-rays demonstrated severe degenerative changes in tricompartmental fashion. After a discussion of the risks and benefits of surgical intervention, the patient expressed understanding of the risks and benefits and agreed with plans for left total knee arthroplasty.   PROCEDURE IN DETAIL: The patient was brought into the operating room, and after adequate femoral nerve block and spinal anesthesia was achieved, a tourniquet was placed on the patient's upper left thigh. The patient's left knee and leg were cleaned and prepped with alcohol and DuraPrep and draped in the usual sterile fashion. A "timeout" was performed as per usual protocol. The left lower extremity was exsanguinated  using an Esmarch, and the tourniquet was inflated to 300 mmHg. An anterior longitudinal incision was made followed by a standard mid vastus approach. A large effusion was evacuated. The deep fibers of the medial collateral ligament were elevated in a subperiosteal fashion off the medial flare of the tibia so as to maintain a continuous soft tissue sleeve. The patella was subluxed laterally, and the patellofemoral ligament was incised. Inspection of the knee demonstrated severe degenerative changes with full thickness loss of articular cartilage to both the medial and lateral compartments. Prominent osteophytes were debrided using a rongeur. A remnant of the anterior and posterior cruciate ligaments was removed. Extensive synovectomy was performed. Two 4.0 mm Schanz pins were inserted into the femur and into the tibia for attachment of the array of trackers used for computer-assisted navigation. Hip center was identified using a circumduction technique. Distal landmarks were mapped using the computer. The distal femur and proximal tibia were mapped using the computer. The distal femoral cutting guide was positioned using computer-assisted navigation so as to achieve a 5-degree distal valgus cut. Cut was performed and verified using the computer. The distal femur was sized, and it was felt that a size 4 femoral component was appropriate. A size 4 cutting guide was positioned, and the anterior cut was performed and verified using the computer. This was followed by completion of the posterior and chamfer cuts. The femoral cutting guide from the central box was then positioned, and the central box cut was performed. Attention was then directed to the proximal tibia. Medial and lateral menisci were excised. The extramedullary tibial cutting guide was positioned using computer-assisted navigation so as to achieve 0-degree varus-valgus alignment and 0-degree posterior slope. Cut was performed  and verified using the  computer. The proximal tibia was sized, and it was felt that a size 4 tibial tray was appropriate. Tibial and femoral trials were inserted, followed by insertion of first a 10, and subsequently a 12.5, and eventually a 15 mm polyethylene trial. This allowed for excellent medial and lateral soft tissue balancing both in full extension and in flexion. Finally, the patella was cut and prepared so as to accommodate a 38 mm 3-peg oval dome patella. A patellar trial was placed, and the knee was placed through a range of motion with excellent patellar tracking appreciated.   The femoral component was removed after debridement of posterior osteophytes. A central post hole for the tibial component was reamed, followed by insertion of a keel punch. The tibial tray was then removed. The cut surfaces of bone were irrigated with copious amounts of normal saline with antibiotic solution using pulsatile lavage and then suctioned dry. Polymethyl methacrylate cement with gentamicin was prepared in the usual fashion using a vacuum mixer. Cement was applied to the cut surface of the proximal tibia as well as along the undersurface of a size 4 MBT tibial component. The tibial component was positioned and impacted into place. Excess cement was removed using freer elevators. Cement was then applied to the cut surface of the femur as well as along the posterior flanges of the size 4 posterior stabilized femoral component. The femoral component was positioned and impacted into place. Excess cement was removed using freer elevators. A 15 mm polyethylene trial was inserted and the knee was brought in full extension with steady axial compression applied. Finally, cement was applied to the backside of a 38 mm 3-peg oval dome patella, and the patellar component was positioned and patellar clamp applied. Excess cement was removed using freer elevators.   After adequate curing of cement, the tourniquet was deflated after a total tourniquet  time of 90 minutes. Hemostasis was achieved using electrocautery. The knee was irrigated with copious amounts of normal saline with antibiotic solution using pulsatile lavage and then suctioned dry. The knee was inspected for any residual cement debris; 30 mL of 0.25% Marcaine with epinephrine was injected along the posterior capsule. A 15 mm stabilized rotating platform polyethylene insert was inserted, and the knee was placed through a range of motion. Excellent medial and lateral soft tissue balancing was appreciated both in full extension and in flexion. Excellent patellar tracking was appreciated. Two medium drains were placed in the wound bed and brought out through a separate stab incision to be attached to a reinfusion system. The medial parapatellar portion of the incision was reapproximated using interrupted sutures of #1 Vicryl. The subcutaneous tissue was approximated in layers using first #0 Vicryl followed by 2-0 Vicryl. Skin was closed with skin staples. A sterile dressing was applied.   The patient tolerated the procedure well. He was transported to the recovery room in stable condition.  ____________________________ Laurice Record. Holley Bouche., MD jph:cb D: 10/26/2012 18:26:48 ET T: 10/26/2012 21:51:23 ET JOB#: [id number]  cc: Laurice Record. Holley Bouche., MD, <Dictator> JAMES P Holley Bouche MD ELECTRONICALLY SIGNED 10/28/2012 19:30

## 2014-10-21 NOTE — Op Note (Signed)
DATE OF BIRTH:  [birth year]  DATE OF PROCEDURE:  07/27/2012  PREOPERATIVE DIAGNOSIS:  Degenerative arthrosis of the right knee.   POSTOPERATIVE DIAGNOSIS:  Degenerative arthrosis of the right knee.   PROCEDURE PERFORMED:  Right total knee arthroplasty using computer-assisted navigation.   SURGEON:  Laurice Record. Holley Bouche., MD  ASSISTANT:  Vance Peper, PA-C (required to maintain retraction throughout the procedure).   ANESTHESIA:  Femoral nerve block and spinal.   ESTIMATED BLOOD LOSS:  600 mL (per cRNA).  FLUIDS REPLACED:  2000 mL of crystalloid.   TOURNIQUET TIME:  88 minutes.   DRAINS:  Two medium drains to Reinfusion System.   SOFT TISSUE RELEASES:  Anterior cruciate ligament, posterior cruciate ligament, deep medial collateral ligament and patellofemoral ligament.   IMPLANTS UTILIZED:  DePuy PFC Sigma size 5 posterior stabilized femoral component (cemented), size 5 MBT tibial component (cemented), 38-mm 3-peg oval dome patella (cemented) and a 10-mm stabilized rotating platform polyethylene insert.   INDICATIONS FOR SURGERY:  The patient is a 69 year old male, who has been seen for complaints of progressive right knee pain. X-rays demonstrated significant degenerative changes with relative varus deformity. After discussion of the risks and benefits of surgical intervention, the patient expressed understanding of the risks and benefits, and agreed with plans for surgical intervention.   PROCEDURE IN DETAIL:  The patient was brought into the operating room, and after adequate femoral nerve block and spinal anesthesia was achieved, a tourniquet was placed on the patient's upper right thigh. The patient's right knee and leg were cleaned and prepped with alcohol and DuraPrep, draped in the usual sterile fashion. A "timeout" was performed as per usual protocol. The right lower extremity was exsanguinated using an Esmarch, and the tourniquet was inflated to 300 mmHg. An anterior longitudinal  incision was made, followed by a standard mid vastus approach. A large effusion was evacuated. The deep fibers of the medial collateral ligament were elevated in a subperiosteal fashion off the medial flare of the tibia, so as to maintain a continuous soft tissue sleeve. The patella was subluxed laterally, and the patellofemoral ligament was incised. Inspection of the knee demonstrated severe degenerative changes, most notably to the medial compartment. Prominent osteophytes were debrided using a rongeur. Anterior and posterior cruciate ligaments were excised. Two 4.0-mm Schanz pins were inserted into the femur and into the tibia for attachment of the array of trackers used for computer-assisted navigation. Hip center was identified using a circumduction technique. Distal landmarks were mapped using the computer. The distal femur and proximal tibia were mapped using the computer. Distal femoral cutting guide was positioned using computer-assisted navigation, so as to achieve 5-degree distal valgus cut. Cut was performed and verified using the computer. The distal femur was sized, and it was felt that a size 5 femoral component was appropriate. A size 5 cutting guide was positioned, and the anterior cut was performed and verified using the computer. This was followed by completion of the posterior and chamfer cuts. Femoral cutting guide for the central box was then positioned, and the central box cut was performed.   Attention was then directed to the proximal tibia. Medial and lateral menisci were excised. The extramedullary tibial cutting guide was positioned using computer-assisted navigation, so as to achieve a 0-degree varus-valgus alignment and 0-degree posterior slope. Cut was performed and verified using the computer. Proximal tibia was sized, and it was felt that a size 5 tibial tray was appropriate. Tibial and femoral trials were inserted, followed by insertion of  a 10-mm polyethylene insert. Excellent  mediolateral soft tissue balancing was appreciated, both in extension and in flexion. Finally, the patella was cut and prepared, so as to accommodate a 38-mm 3-peg oval dome patella. Patellar trial was placed, and the knee was placed through a range of motion with excellent patellar tracking appreciated. The femoral trial was then removed. Central post hole for the tibial component was reamed, followed by insertion of a keel punch. Tibial trial was then removed. The cut surfaces of bone were irrigated with copious amounts of normal saline with antibiotic solution using pulsatile lavage and then suctioned dry. Polymethyl methacrylate cement with gentamicin was prepared in the usual fashion using a vacuum mixer. Cement was applied to the cut surface of the proximal tibia, as well as along the undersurface of a size 5 MBT tibial component. The tibial component was positioned and impacted into place. Excess cement was removed using freer elevators. Cement was then applied to the cut surface of the femur, as well as along the posterior flanges of a size 5 posterior stabilized femoral component. Femoral component was positioned and impacted into place. Excess cement was removed using freer elevators. A 10-mm polyethylene trial was inserted, and the knee was brought into full extension with steady axial compression applied. Finally, cement was applied to the backside of a 38-mm 3-peg oval dome patella, and the patellar component was positioned and patellar clamp applied. Excess cement was removed using freer elevators.   After adequate curing of cement, the tourniquet was deflated after a total tourniquet time of 88 minutes. Hemostasis was achieved using electrocautery. The knee was irrigated with copious amounts of normal saline with antibiotic solution using pulsatile lavage and then suctioned dry. The knee was inspected for any residual cement debris. Then, 30 mL of 0.25% Marcaine with epinephrine was injected along  the posterior capsule. A 10-mm stabilized rotating platform polyethylene insert was inserted, and the knee was placed through a range of motion with excellent patellar tracking appreciated and excellent mediolateral soft tissue balancing noted. Two medium drains were placed in the wound bed and brought out through a separate stab incision to be attached to Reinfusion System. The medial parapatellar portion of the incision was reapproximated using interrupted sutures of #1 Vicryl. The subcutaneous tissue was reapproximated using first #0 Vicryl, followed by #2-0 Vicryl. Skin was closed with skin staples. A sterile dressing was applied.   The patient tolerated the procedure well. He was transported to the recovery room in stable condition.    ____________________________ Laurice Record. Holley Bouche., MD jph:ms D: 07/27/2012 19:30:40 ET T: 07/27/2012 22:52:17 ET JOB#: [id number]  cc: Jeneen Rinks P. Holley Bouche., MD, <Dictator> JAMES P Holley Bouche MD ELECTRONICALLY SIGNED 07/28/2012 23:40

## 2014-12-15 ENCOUNTER — Ambulatory Visit (INDEPENDENT_AMBULATORY_CARE_PROVIDER_SITE_OTHER): Payer: Medicare Other | Admitting: Family Medicine

## 2014-12-15 ENCOUNTER — Encounter: Payer: Self-pay | Admitting: Family Medicine

## 2014-12-15 VITALS — BP 142/80 | HR 81 | Temp 99.2°F | Ht 71.0 in | Wt 305.4 lb

## 2014-12-15 DIAGNOSIS — D473 Essential (hemorrhagic) thrombocythemia: Secondary | ICD-10-CM | POA: Diagnosis not present

## 2014-12-15 DIAGNOSIS — F419 Anxiety disorder, unspecified: Secondary | ICD-10-CM | POA: Insufficient documentation

## 2014-12-15 DIAGNOSIS — R7989 Other specified abnormal findings of blood chemistry: Secondary | ICD-10-CM | POA: Insufficient documentation

## 2014-12-15 DIAGNOSIS — M25562 Pain in left knee: Secondary | ICD-10-CM

## 2014-12-15 DIAGNOSIS — M25569 Pain in unspecified knee: Secondary | ICD-10-CM | POA: Insufficient documentation

## 2014-12-15 DIAGNOSIS — G479 Sleep disorder, unspecified: Secondary | ICD-10-CM | POA: Insufficient documentation

## 2014-12-15 DIAGNOSIS — E119 Type 2 diabetes mellitus without complications: Secondary | ICD-10-CM | POA: Diagnosis not present

## 2014-12-15 DIAGNOSIS — E669 Obesity, unspecified: Secondary | ICD-10-CM | POA: Insufficient documentation

## 2014-12-15 DIAGNOSIS — M25561 Pain in right knee: Secondary | ICD-10-CM | POA: Diagnosis not present

## 2014-12-15 DIAGNOSIS — G8929 Other chronic pain: Secondary | ICD-10-CM | POA: Diagnosis not present

## 2014-12-15 DIAGNOSIS — G4700 Insomnia, unspecified: Secondary | ICD-10-CM | POA: Insufficient documentation

## 2014-12-15 DIAGNOSIS — Z9181 History of falling: Secondary | ICD-10-CM | POA: Insufficient documentation

## 2014-12-15 DIAGNOSIS — E785 Hyperlipidemia, unspecified: Secondary | ICD-10-CM | POA: Insufficient documentation

## 2014-12-15 DIAGNOSIS — E559 Vitamin D deficiency, unspecified: Secondary | ICD-10-CM | POA: Insufficient documentation

## 2014-12-15 DIAGNOSIS — I10 Essential (primary) hypertension: Secondary | ICD-10-CM | POA: Insufficient documentation

## 2014-12-15 DIAGNOSIS — Z1389 Encounter for screening for other disorder: Secondary | ICD-10-CM | POA: Insufficient documentation

## 2014-12-15 DIAGNOSIS — D649 Anemia, unspecified: Secondary | ICD-10-CM | POA: Insufficient documentation

## 2014-12-15 MED ORDER — AMLODIPINE BESYLATE 10 MG PO TABS: 10.0000 mg | ORAL_TABLET | Freq: Every day | ORAL | Status: DC

## 2014-12-15 MED ORDER — BENAZEPRIL HCL 40 MG PO TABS: 40.0000 mg | ORAL_TABLET | Freq: Two times a day (BID) | ORAL | Status: DC

## 2014-12-15 MED ORDER — ZOLPIDEM TARTRATE 10 MG PO TABS
10.0000 mg | ORAL_TABLET | Freq: Every evening | ORAL | Status: AC | PRN
Start: 2014-12-15 — End: 2015-01-14

## 2014-12-15 MED ORDER — ALPRAZOLAM 1 MG PO TABS: 1.0000 mg | ORAL_TABLET | Freq: Every day | ORAL | Status: DC | PRN

## 2014-12-15 MED ORDER — PIOGLITAZONE HCL-METFORMIN HCL 15-850 MG PO TABS: 1.0000 | ORAL_TABLET | Freq: Two times a day (BID) | ORAL | Status: DC

## 2014-12-15 NOTE — Progress Notes (Signed)
Name: [name] [name]   MRN: [id number]    DOB: 1945/08/26   Date:12/15/2014       Progress Note  Subjective  Chief Complaint  Chief Complaint  Patient presents with  . Establish Care  . Hypertension  . Diabetes  . Anxiety    Hypertension This is a chronic problem. The problem is controlled (normally 140s/70s at home). Associated symptoms include anxiety. Pertinent negatives include no chest pain, headaches, palpitations, peripheral edema or shortness of breath. Past treatments include ACE inhibitors and calcium channel blockers. The current treatment provides significant improvement. There is no history of angina, kidney disease, CAD/MI or CVA.  Diabetes He presents for his follow-up diabetic visit. He has type 2 diabetes mellitus. Hypoglycemia symptoms include nervousness/anxiousness. Pertinent negatives for hypoglycemia include no headaches. Associated symptoms include polydipsia and polyuria (Pt. has enlarged prostate and has frequent urination.). Pertinent negatives for diabetes include no chest pain, no fatigue and no foot paresthesias. There are no hypoglycemic complications. Diabetic complications include impotence. Pertinent negatives for diabetic complications include no CVA or heart disease. Current diabetic treatment includes oral agent (dual therapy). He is following a diabetic diet. He has not had a previous visit with a dietitian. Blood glucose monitoring compliance is poor (occasionally checks his BG, not regularly.). His breakfast blood glucose is taken between 6-7 am. His breakfast blood glucose range is generally 110-130 mg/dl. An ACE inhibitor/angiotensin II receptor blocker is being taken.  Anxiety Presents for follow-up visit. Symptoms include excessive worry, impotence, irritability, muscle tension and nervous/anxious behavior. Patient reports no chest pain, depressed mood, insomnia, palpitations or shortness of breath. The severity of symptoms is moderate. The symptoms are  aggravated by family issues. The quality of sleep is fair.   Past treatments include benzodiazephines and SSRIs. The treatment provided moderate relief. Compliance with prior treatments has been good (has been trying to wean off Sertraline).      Past Medical History  Diagnosis Date  . Diverticulosis   . Diabetes mellitus   . Normocytic anemia   . Hemorrhoids   . Anxiety   . Hypertension     Past Surgical History  Procedure Laterality Date  . Joint replacement Bilateral 2014    Total knee replacement.    History reviewed. No pertinent family history.  History   Social History  . Marital Status: Married    Spouse Name: N/A  . Number of Children: N/A  . Years of Education: N/A   Occupational History  . Not on file.   Social History Main Topics  . Smoking status: Never Smoker   . Smokeless tobacco: Not on file  . Alcohol Use: 0.0 oz/week    0 Standard drinks or equivalent per week     Comment: occasionally  . Drug Use: Not on file  . Sexual Activity: Not on file   Other Topics Concern  . Not on file   Social History Narrative     Current outpatient prescriptions:  .  ALPRAZolam (XANAX) 1 MG tablet, Take 1 tablet (1 mg total) by mouth daily as needed., Disp: 30 tablet, Rfl: 0 .  amLODipine (NORVASC) 10 MG tablet, Take 1 tablet (10 mg total) by mouth daily., Disp: 90 tablet, Rfl: 0 .  aspirin 81 MG tablet, Take 81 mg by mouth daily.  , Disp: , Rfl:  .  benazepril (LOTENSIN) 40 MG tablet, Take 1 tablet (40 mg total) by mouth 2 (two) times daily., Disp: 180 tablet, Rfl: 0 .  pioglitazone-metformin (ACTOPLUS MET) 15-850  MG per tablet, Take 1 tablet by mouth 2 (two) times daily with a meal., Disp: 180 tablet, Rfl: 0 .  terazosin (HYTRIN) 10 MG capsule, Take 10 mg by mouth at bedtime.  , Disp: , Rfl:  .  Vitamin D, Ergocalciferol, (DRISDOL) 50000 UNITS CAPS, Take 50,000 Units by mouth every 7 (seven) days., Disp: , Rfl:  .  dutasteride (AVODART) 0.5 MG capsule, ,  Disp: , Rfl:  .  zolpidem (AMBIEN) 10 MG tablet, Take 1 tablet (10 mg total) by mouth at bedtime as needed for sleep. Pt. takes Ambien only as needed and not every day., Disp: 30 tablet, Rfl: 0  No Known Allergies   Review of Systems  Constitutional: Positive for irritability. Negative for fatigue.  Respiratory: Negative for shortness of breath.   Cardiovascular: Negative for chest pain and palpitations.  Genitourinary: Positive for impotence.  Musculoskeletal: Positive for joint pain.  Neurological: Negative for headaches.  Endo/Heme/Allergies: Positive for polydipsia.  Psychiatric/Behavioral: The patient is nervous/anxious. The patient does not have insomnia.       Objective  Filed Vitals:   12/15/14 1530  BP: 142/80  Pulse: 81  Temp: 99.2 F (37.3 C)  TempSrc: Oral  Height: 5' 11"  (1.803 m)  Weight: 305 lb 6.4 oz (138.529 kg)    Physical Exam  Constitutional: He is oriented to person, place, and time and well-developed, well-nourished, and in no distress.  HENT:  Head: Normocephalic and atraumatic.  Cardiovascular: Normal rate and regular rhythm.   Pulmonary/Chest: Effort normal and breath sounds normal.  Abdominal: Soft. Bowel sounds are normal.  Musculoskeletal:       Right knee: He exhibits swelling (mild generalized swelling). He exhibits normal range of motion. No tenderness found.       Left knee: He exhibits swelling (mild generalized swelling). He exhibits normal range of motion. No tenderness found.  Neurological: He is alert and oriented to person, place, and time.  Psychiatric: Memory, affect and judgment normal.  Nursing note and vitals reviewed.      No results found for this or any previous visit (from the past 2160 hour(s)).   Assessment & Plan 1. Essential hypertension Systolic blood pressure is elevated in office today. For now, continue present medications and follow-up with review of home blood pressure logs. - amLODipine (NORVASC) 10 MG  tablet; Take 1 tablet (10 mg total) by mouth daily.  Dispense: 90 tablet; Refill: 0 - benazepril (LOTENSIN) 40 MG tablet; Take 1 tablet (40 mg total) by mouth 2 (two) times daily.  Dispense: 180 tablet; Refill: 0  2. Diabetes mellitus type 2, controlled Continue present therapy. Repeat A1c and follow-up. - pioglitazone-metformin (ACTOPLUS MET) 15-850 MG per tablet; Take 1 tablet by mouth 2 (two) times daily with a meal.  Dispense: 180 tablet; Refill: 0 - HgB A1c  3. Anxiety  - ALPRAZolam (XANAX) 1 MG tablet; Take 1 tablet (1 mg total) by mouth daily as needed.  Dispense: 30 tablet; Refill: 0  4. 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 He has occasional pain in his knees. He takes Tylenol as needed pain relief. He is requesting tramadol for his knee pain. I have recommended that he follow up with orthopedic surgeon for further evaluation.  5. Insomnia  - zolpidem (AMBIEN) 10 MG tablet; Take 1 tablet (10 mg total) by mouth at bedtime as needed for sleep. Pt. takes Ambien only as needed and not every day.  Dispense: 30 tablet; Refill: 0  6. Elevated platelet count  - CBC  7. Hyperlipidemia  - Comprehensive metabolic panel - Lipid Profile  There are no diagnoses linked to this encounter.  Avrielle Fry Asad A. [facility] 12/15/2014 7:03 PM

## 2014-12-21 ENCOUNTER — Telehealth: Payer: Self-pay | Admitting: Family Medicine

## 2014-12-21 LAB — COMPREHENSIVE METABOLIC PANEL
ALT: 15 [IU]/L (ref 0–44)
AST: 14 IU/L (ref 0–40)
Albumin/Globulin Ratio: 1.7 (ref 1.1–2.5)
Albumin: 4.1 g/dL (ref 3.6–4.8)
Alkaline Phosphatase: 80 IU/L (ref 39–117)
BUN/Creatinine Ratio: 16 (ref 10–22)
BUN: 9 mg/dL (ref 8–27)
Bilirubin Total: 0.3 mg/dL (ref 0.0–1.2)
CO2: 26 mmol/L (ref 18–29)
Calcium: 9.1 mg/dL (ref 8.6–10.2)
Chloride: 94 mmol/L — ABNORMAL LOW (ref 97–108)
Creatinine, Ser: 0.58 mg/dL — ABNORMAL LOW (ref 0.76–1.27)
GFR calc Af Amer: 120 mL/min/{1.73_m2} (ref >59)
GFR calc non Af Amer: 104 mL/min/{1.73_m2} (ref >59)
Globulin, Total: 2.4 g/dL (ref 1.5–4.5)
Glucose: 132 mg/dL — ABNORMAL HIGH (ref 65–99)
Potassium: 4.3 mmol/L (ref 3.5–5.2)
Sodium: 134 mmol/L (ref 134–144)
Total Protein: 6.5 g/dL (ref 6.0–8.5)

## 2014-12-21 LAB — LIPID PANEL
Chol/HDL Ratio: 2.6 ratio units (ref 0.0–5.0)
Cholesterol, Total: 170 mg/dL (ref 100–199)
HDL: 65 mg/dL (ref >39)
LDL Calculated: 90 mg/dL (ref 0–99)
Triglycerides: 75 mg/dL (ref 0–149)
VLDL Cholesterol Cal: 15 mg/dL (ref 5–40)

## 2014-12-21 LAB — CBC
Hematocrit: 35.1 % — ABNORMAL LOW (ref 37.5–51.0)
Hemoglobin: 11.9 g/dL — ABNORMAL LOW (ref 12.6–17.7)
MCH: 32.4 pg (ref 26.6–33.0)
MCHC: 33.9 g/dL (ref 31.5–35.7)
MCV: 96 fL (ref 79–97)
Platelets: 375 10*3/uL (ref 150–379)
RBC: 3.67 x10E6/uL — ABNORMAL LOW (ref 4.14–5.80)
RDW: 13.5 % (ref 12.3–15.4)
WBC: 6.8 10*3/uL (ref 3.4–10.8)

## 2014-12-21 LAB — HEMOGLOBIN A1C
Est. average glucose Bld gHb Est-mCnc: 160 mg/dL
Hgb A1c MFr Bld: 7.2 % — ABNORMAL HIGH (ref 4.8–5.6)

## 2014-12-21 NOTE — Telephone Encounter (Signed)
Pt went to [facility] on yesterday. He is requesting that when we receive the lab results to please fax him a copy to [phone]. Thank you

## 2014-12-26 NOTE — Telephone Encounter (Signed)
Faxed 12-26-14,

## 2014-12-27 NOTE — Progress Notes (Signed)
Faxed results to pt, per their request, called and LM with wife to have pt schedule an appt to discuss starting a statin drug.

## 2014-12-29 ENCOUNTER — Telehealth: Payer: Self-pay | Admitting: Family Medicine

## 2014-12-29 NOTE — Telephone Encounter (Signed)
PT SAID THAT THEY HAVE BEEN WAITING 1 WK FOR PRIOR APPROVAL FOR THE PATIENTS AMBIEN. PLEASE LET THEM KNOW OF THE STATUS.

## 2015-01-05 NOTE — Telephone Encounter (Signed)
All information sent to insurance company for approval

## 2015-01-13 ENCOUNTER — Telehealth: Payer: Self-pay | Admitting: Family Medicine

## 2015-01-13 ENCOUNTER — Other Ambulatory Visit: Payer: Self-pay | Admitting: Family Medicine

## 2015-01-13 MED ORDER — SUVOREXANT 10 MG PO TABS: 10.0000 mg | ORAL_TABLET | Freq: Every day | ORAL | Status: DC

## 2015-01-13 NOTE — Telephone Encounter (Signed)
Spoke with [facility] about this Prior Auth for Zolpidem, since he hasn't tried Belsomra on Rozerem, Zolpidem wouldn't be approved. He must try and fail these medications first. Please have Dr. Manuella Ghazi send one of these medications to his pharmacy. I tried to call [name] [name] but, had to leave a message for him.

## 2015-01-13 NOTE — Telephone Encounter (Signed)
[name] [name] from [facility] is requesting a return call. Would like to discuss Zolipem. States she faxed over a form on yesterday. Reference #: [id number] ZC (P) [id number] [phone]

## 2015-01-13 NOTE — Telephone Encounter (Signed)
Per Dr. Manuella Ghazi started patient on Belsomra 10mg  daily at bedtime, medication has been refilled #30 no refills sent to [facility] Mail [facility]. Patient has appointment scheduled for 03/16/2015

## 2015-01-27 ENCOUNTER — Telehealth: Payer: Self-pay

## 2015-01-27 NOTE — Telephone Encounter (Signed)
[name] [name] called in and stated that he still hasn't received his Belsomra. Could you please call him and let him know the status. Thanks

## 2015-01-27 NOTE — Telephone Encounter (Signed)
Patient Medication was filled on 01/13/2015.

## 2015-02-08 ENCOUNTER — Telehealth: Payer: Self-pay | Admitting: Family Medicine

## 2015-02-08 NOTE — Telephone Encounter (Signed)
Requesting a return call. States it has been about 4-5 weeks that her husband medication has not been called in to his pharmacy. He was prescribed ambien and then something different was suppose to have been called in. Please return call and resend script.

## 2015-02-08 NOTE — Telephone Encounter (Signed)
Spoke with patient and confirmed Medication Belsomra in place of Ambien has been refilled and called into [facility]

## 2015-03-15 ENCOUNTER — Other Ambulatory Visit: Payer: Self-pay | Admitting: Family Medicine

## 2015-03-16 ENCOUNTER — Ambulatory Visit: Payer: Medicare Other | Admitting: Family Medicine

## 2015-03-16 NOTE — Telephone Encounter (Signed)
Medications have been refilled and sent to [facility]

## 2015-03-20 ENCOUNTER — Other Ambulatory Visit: Payer: Self-pay | Admitting: Family Medicine

## 2015-03-20 NOTE — Telephone Encounter (Signed)
Pt requesting refill.[place]

## 2015-03-22 ENCOUNTER — Encounter: Payer: Self-pay | Admitting: Internal Medicine

## 2015-05-04 ENCOUNTER — Other Ambulatory Visit: Payer: Self-pay | Admitting: Family Medicine

## 2015-05-30 ENCOUNTER — Ambulatory Visit: Payer: Self-pay | Admitting: Family Medicine

## 2015-06-08 DIAGNOSIS — N401 Enlarged prostate with lower urinary tract symptoms: Secondary | ICD-10-CM | POA: Insufficient documentation

## 2015-06-12 ENCOUNTER — Ambulatory Visit: Payer: Self-pay | Admitting: Family Medicine

## 2016-02-08 DIAGNOSIS — E7800 Pure hypercholesterolemia, unspecified: Secondary | ICD-10-CM | POA: Insufficient documentation

## 2018-07-01 DIAGNOSIS — Z9841 Cataract extraction status, right eye: Secondary | ICD-10-CM

## 2018-07-01 HISTORY — DX: Cataract extraction status, left eye: Z98.41

## 2018-07-08 ENCOUNTER — Encounter: Payer: Self-pay | Admitting: *Deleted

## 2018-07-14 ENCOUNTER — Ambulatory Visit
Admission: RE | Admit: 2018-07-14 | Discharge: 2018-07-14 | Disposition: A | Discharge status: Home / Self Care | Payer: Medicare Other | Attending: Ophthalmology | Admitting: Ophthalmology

## 2018-07-14 ENCOUNTER — Encounter: Payer: Self-pay | Admitting: *Deleted

## 2018-07-14 ENCOUNTER — Ambulatory Visit: Payer: Medicare Other | Admitting: Anesthesiology

## 2018-07-14 ENCOUNTER — Encounter
Admission: RE | Disposition: A | Discharge status: Home / Self Care | Payer: Self-pay | Source: Home / Self Care | Attending: Ophthalmology

## 2018-07-14 ENCOUNTER — Other Ambulatory Visit: Payer: Self-pay

## 2018-07-14 DIAGNOSIS — Z7982 Long term (current) use of aspirin: Secondary | ICD-10-CM | POA: Insufficient documentation

## 2018-07-14 DIAGNOSIS — Z79899 Other long term (current) drug therapy: Secondary | ICD-10-CM | POA: Insufficient documentation

## 2018-07-14 DIAGNOSIS — Z7984 Long term (current) use of oral hypoglycemic drugs: Secondary | ICD-10-CM | POA: Diagnosis not present

## 2018-07-14 DIAGNOSIS — G4733 Obstructive sleep apnea (adult) (pediatric): Secondary | ICD-10-CM | POA: Diagnosis not present

## 2018-07-14 DIAGNOSIS — E119 Type 2 diabetes mellitus without complications: Secondary | ICD-10-CM | POA: Diagnosis not present

## 2018-07-14 DIAGNOSIS — I10 Essential (primary) hypertension: Secondary | ICD-10-CM | POA: Insufficient documentation

## 2018-07-14 DIAGNOSIS — H2512 Age-related nuclear cataract, left eye: Secondary | ICD-10-CM | POA: Diagnosis present

## 2018-07-14 HISTORY — DX: Disorder of prostate, unspecified: N42.9

## 2018-07-14 HISTORY — DX: Unspecified hearing loss, unspecified ear: H91.90

## 2018-07-14 HISTORY — PX: CATARACT EXTRACTION W/PHACO: SHX586

## 2018-07-14 HISTORY — DX: Sleep apnea, unspecified: G47.30

## 2018-07-14 LAB — GLUCOSE, CAPILLARY: Glucose-Capillary: 228 mg/dL — ABNORMAL HIGH (ref 70–99)

## 2018-07-14 SURGERY — PHACOEMULSIFICATION, CATARACT, WITH IOL INSERTION
Anesthesia: Monitor Anesthesia Care | Site: Eye | Laterality: Left

## 2018-07-14 MED ORDER — TETRACAINE HCL 0.5 % OP SOLN
OPHTHALMIC | Status: AC
  Filled 2018-07-14: qty 4

## 2018-07-14 MED ORDER — TETRACAINE HCL 0.5 % OP SOLN
1.0000 [drp] | OPHTHALMIC | Status: AC | PRN
  Administered 2018-07-14 (×3): 1 [drp] via OPHTHALMIC

## 2018-07-14 MED ORDER — ARMC OPHTHALMIC DILATING DROPS
1.0000 "application " | OPHTHALMIC | Status: AC
  Administered 2018-07-14 (×3): 1 via OPHTHALMIC

## 2018-07-14 MED ORDER — LIDOCAINE HCL (PF) 4 % IJ SOLN
INTRAMUSCULAR | Status: AC
  Filled 2018-07-14: qty 5

## 2018-07-14 MED ORDER — CARBACHOL 0.01 % IO SOLN
INTRAOCULAR | Status: DC | PRN
  Administered 2018-07-14: .5 mL via INTRAOCULAR

## 2018-07-14 MED ORDER — ARMC OPHTHALMIC DILATING DROPS
OPHTHALMIC | Status: AC
  Filled 2018-07-14: qty 0.5

## 2018-07-14 MED ORDER — EPINEPHRINE PF 1 MG/ML IJ SOLN
INTRAOCULAR | Status: DC | PRN
  Administered 2018-07-14: 1 mL via OPHTHALMIC

## 2018-07-14 MED ORDER — POVIDONE-IODINE 5 % OP SOLN
OPHTHALMIC | Status: DC | PRN
  Administered 2018-07-14: 1 via OPHTHALMIC

## 2018-07-14 MED ORDER — POVIDONE-IODINE 5 % OP SOLN
OPHTHALMIC | Status: AC
  Filled 2018-07-14: qty 30

## 2018-07-14 MED ORDER — MIDAZOLAM HCL 2 MG/2ML IJ SOLN
INTRAMUSCULAR | Status: AC
  Filled 2018-07-14: qty 2

## 2018-07-14 MED ORDER — NA CHONDROIT SULF-NA HYALURON 40-17 MG/ML IO SOLN
INTRAOCULAR | Status: AC
  Filled 2018-07-14: qty 1

## 2018-07-14 MED ORDER — MIDAZOLAM HCL 2 MG/2ML IJ SOLN
INTRAMUSCULAR | Status: DC | PRN
  Administered 2018-07-14 (×2): 1 mg via INTRAVENOUS

## 2018-07-14 MED ORDER — MOXIFLOXACIN HCL 0.5 % OP SOLN: 1.0000 [drp] | OPHTHALMIC | Status: DC | PRN

## 2018-07-14 MED ORDER — MOXIFLOXACIN HCL 0.5 % OP SOLN
OPHTHALMIC | Status: DC | PRN
  Administered 2018-07-14: .2 mL via OPHTHALMIC

## 2018-07-14 MED ORDER — EPINEPHRINE PF 1 MG/ML IJ SOLN
INTRAMUSCULAR | Status: AC
  Filled 2018-07-14: qty 1

## 2018-07-14 MED ORDER — MOXIFLOXACIN HCL 0.5 % OP SOLN
OPHTHALMIC | Status: AC
  Filled 2018-07-14: qty 3

## 2018-07-14 MED ORDER — NA CHONDROIT SULF-NA HYALURON 40-17 MG/ML IO SOLN
INTRAOCULAR | Status: DC | PRN
  Administered 2018-07-14: 1 mL via INTRAOCULAR

## 2018-07-14 MED ORDER — LIDOCAINE HCL (PF) 4 % IJ SOLN
INTRAOCULAR | Status: DC | PRN
  Administered 2018-07-14: 2 mL via OPHTHALMIC

## 2018-07-14 MED ORDER — SODIUM CHLORIDE 0.9 % IV SOLN
INTRAVENOUS | Status: DC
  Administered 2018-07-14: 07:00:00 via INTRAVENOUS

## 2018-07-14 SURGICAL SUPPLY — 16 items
GLOVE BIO SURGEON STRL SZ8 (GLOVE) ×2 IMPLANT
GLOVE BIOGEL M 6.5 STRL (GLOVE) ×2 IMPLANT
GLOVE SURG LX 8.0 MICRO (GLOVE) ×1
GLOVE SURG LX STRL 8.0 MICRO (GLOVE) ×1 IMPLANT
GOWN STRL REUS W/ TWL LRG LVL3 (GOWN DISPOSABLE) ×2 IMPLANT
GOWN STRL REUS W/TWL LRG LVL3 (GOWN DISPOSABLE) ×4
LABEL CATARACT MEDS ST (LABEL) ×2 IMPLANT
LENS IOL TECNIS ITEC 20.0 (Intraocular Lens) ×1 IMPLANT
PACK CATARACT (MISCELLANEOUS) ×2 IMPLANT
PACK CATARACT BRASINGTON LX (MISCELLANEOUS) ×2 IMPLANT
PACK EYE AFTER SURG (MISCELLANEOUS) ×2 IMPLANT
SOL BSS BAG (MISCELLANEOUS) ×2
SOLUTION BSS BAG (MISCELLANEOUS) ×1 IMPLANT
SYR 5ML LL (SYRINGE) ×2 IMPLANT
WATER STERILE IRR 250ML POUR (IV SOLUTION) ×2 IMPLANT
WIPE NON LINTING 3.25X3.25 (MISCELLANEOUS) ×2 IMPLANT

## 2018-07-14 NOTE — H&P (Signed)
All labs reviewed. Abnormal studies sent to patients PCP when indicated.  Previous H&P reviewed, patient examined, there are NO CHANGES.  [name] Previti Porfilio1/14/20208:26 AM

## 2018-07-14 NOTE — Discharge Instructions (Addendum)
Eye Surgery Discharge Instructions ° °Expect mild scratchy sensation or mild soreness. °DO NOT RUB YOUR EYE! ° °The day of surgery: °• Minimal physical activity, but bed rest is not required °• No reading, computer work, or close hand work °• No bending, lifting, or straining. °• May watch TV ° °For 24 hours: °• No driving, legal decisions, or alcoholic beverages °• Safety precautions °• Eat anything you prefer: It is better to start with liquids, then soup then solid foods. °• Solar shield eyeglasses should be worn for comfort in the sunlight/patch while sleeping ° °Resume all regular medications including aspirin or Coumadin if these were discontinued prior to surgery. °You may shower, bathe, shave, or wash your hair. °Tylenol may be taken for mild discomfort. °FOLLOW DR. PORFILIO'S EYE DROP INSTRUCTION SHEET AS REVIEWED. ° °Call your doctor if you experience significant pain, nausea, or vomiting, fever > 101 or other signs of infection. [phone] or [phone] °Specific instructions: ° °Follow-up Information   ° Porfilio, William, MD Follow up.   °Specialty:  Ophthalmology °Why:  07/15/18 @ 11:00 am °Contact information: °[address] °[place] [address] °[phone] ° ° °  °  °  ° ° °

## 2018-07-14 NOTE — Op Note (Signed)
PREOPERATIVE DIAGNOSIS:  Nuclear sclerotic cataract of the left eye.   POSTOPERATIVE DIAGNOSIS:  Nuclear sclerotic cataract of the left eye.   OPERATIVE PROCEDURE: Procedure(s): CATARACT EXTRACTION PHACO AND INTRAOCULAR LENS PLACEMENT (IOC) LEFT, DIABETIC   SURGEON:  Birder Robson, MD.   ANESTHESIA:  Anesthesiologist: Piscitello, Precious Haws, MD CRNA: Eben Burow, CRNA  1.      Managed anesthesia care. 2.     0.16ml of Shugarcaine was instilled following the paracentesis   COMPLICATIONS:  None.   TECHNIQUE:   Stop and chop   DESCRIPTION OF PROCEDURE:  The patient was examined and consented in the preoperative holding area where the aforementioned topical anesthesia was applied to the left eye and then brought back to the Operating Room where the left eye was prepped and draped in the usual sterile ophthalmic fashion and a lid speculum was placed. A paracentesis was created with the side port blade and the anterior chamber was filled with viscoelastic. A near clear corneal incision was performed with the steel keratome. A continuous curvilinear capsulorrhexis was performed with a cystotome followed by the capsulorrhexis forceps. Hydrodissection and hydrodelineation were carried out with BSS on a blunt cannula. The lens was removed in a stop and chop  technique and the remaining cortical material was removed with the irrigation-aspiration handpiece. The capsular bag was inflated with viscoelastic and the Technis ZCB00 lens was placed in the capsular bag without complication. The remaining viscoelastic was removed from the eye with the irrigation-aspiration handpiece. The wounds were hydrated. The anterior chamber was flushed with Miostat and the eye was inflated to physiologic pressure. 0.61ml Vigamox was placed in the anterior chamber. The wounds were found to be water tight. The eye was dressed with Vigamox. The patient was given protective glasses to wear throughout the day and a shield with  which to sleep tonight. The patient was also given drops with which to begin a drop regimen today and will follow-up with me in one day. Implant Name Type Inv. Item Serial No. Manufacturer Lot No. LRB No. Used  LENS IOL DIOP 20.0 - [id number] Intraocular Lens LENS IOL DIOP 20.0 [id number]  Left 1    Procedure(s) with comments: CATARACT EXTRACTION PHACO AND INTRAOCULAR LENS PLACEMENT (IOC) LEFT, DIABETIC (Left) - [place] 00:50 CDE 8.93 Fluid pack lot # [id number]  Electronically signed: Birder Robson 07/14/2018 8:55 AM

## 2018-07-14 NOTE — Anesthesia Postprocedure Evaluation (Signed)
Anesthesia Post Note  Patient: [name] [name]  Procedure(s) Performed: CATARACT EXTRACTION PHACO AND INTRAOCULAR LENS PLACEMENT (IOC) LEFT, DIABETIC (Left Eye)  Patient location during evaluation: Short Stay Anesthesia Type: MAC Level of consciousness: awake, awake and alert, oriented and patient cooperative Pain management: satisfactory to patient Vital Signs Assessment: post-procedure vital signs reviewed and stable Respiratory status: spontaneous breathing, nonlabored ventilation and respiratory function stable Cardiovascular status: blood pressure returned to baseline and stable Postop Assessment: no headache and no apparent nausea or vomiting Anesthetic complications: no     Last Vitals:  Vitals:   07/14/18 0719 07/14/18 0857  BP: (!) 145/76 134/73  Pulse: 84 74  Resp:  16  Temp: (!) 36.3 C (!) 36.2 C  SpO2: 97% 99%    Last Pain:  Vitals:   07/14/18 0857  TempSrc: Temporal  PainSc: 0-No pain                 Eben Burow

## 2018-07-14 NOTE — Anesthesia Preprocedure Evaluation (Signed)
Anesthesia Evaluation  Patient identified by MRN, date of birth, ID band Patient awake    Reviewed: Allergy & Precautions, H&P , NPO status , Patient's Chart, lab work & pertinent test results  History of Anesthesia Complications Negative for: history of anesthetic complications  Airway Mallampati: III  TM Distance: >3 FB Neck ROM: limited    Dental  (+) Chipped, Poor Dentition   Pulmonary neg shortness of breath, sleep apnea ,           Cardiovascular Exercise Tolerance: Good hypertension, (-) angina(-) Past MI and (-) DOE      Neuro/Psych PSYCHIATRIC DISORDERS negative neurological ROS     GI/Hepatic negative GI ROS, Neg liver ROS,   Endo/Other  diabetes, Type 2  Renal/GU      Musculoskeletal   Abdominal   Peds  Hematology negative hematology ROS (+)   Anesthesia Other Findings Past Medical History: No date: Anxiety No date: Diabetes mellitus No date: Diverticulosis No date: Hemorrhoids No date: HOH (hard of hearing)     Comment:  HOH No date: Hypertension No date: Normocytic anemia No date: Prostate disorder No date: Sleep apnea     Comment:  no cpap  Past Surgical History: No date: COLONOSCOPY 2014: JOINT REPLACEMENT; Bilateral     Comment:  Total knee replacement.  BMI    Body Mass Index:  37.42 kg/m      Reproductive/Obstetrics negative OB ROS                             Anesthesia Physical Anesthesia Plan  ASA: III  Anesthesia Plan: MAC   Post-op Pain Management:    Induction: Intravenous  PONV Risk Score and Plan:   Airway Management Planned: Natural Airway and Nasal Cannula  Additional Equipment:   Intra-op Plan:   Post-operative Plan:   Informed Consent: I have reviewed the patients History and Physical, chart, labs and discussed the procedure including the risks, benefits and alternatives for the proposed anesthesia with the patient or  authorized representative who has indicated his/her understanding and acceptance.   Dental Advisory Given  Plan Discussed with: Anesthesiologist, CRNA and Surgeon  Anesthesia Plan Comments: (Patient consented for risks of anesthesia including but not limited to:  - adverse reactions to medications - damage to teeth, lips or other oral mucosa - sore throat or hoarseness - Damage to heart, brain, lungs or loss of life  Patient voiced understanding.)        Anesthesia Quick Evaluation

## 2018-07-14 NOTE — Anesthesia Post-op Follow-up Note (Signed)
Anesthesia QCDR form completed.        

## 2018-07-14 NOTE — Transfer of Care (Signed)
Immediate Anesthesia Transfer of Care Note  Patient: [name] [name]  Procedure(s) Performed: CATARACT EXTRACTION PHACO AND INTRAOCULAR LENS PLACEMENT (IOC) LEFT, DIABETIC (Left Eye)  Patient Location: Short Stay  Anesthesia Type:MAC  Level of Consciousness: awake, alert , oriented and patient cooperative  Airway & Oxygen Therapy: Patient Spontanous Breathing  Post-op Assessment: Report given to RN and Post -op Vital signs reviewed and stable  Post vital signs: Reviewed and stable  Last Vitals:  Vitals Value Taken Time  BP    Temp    Pulse    Resp    SpO2      Last Pain:  Vitals:   07/14/18 0719  TempSrc: Tympanic  PainSc: 0-No pain         Complications: No apparent anesthesia complications

## 2018-07-15 ENCOUNTER — Encounter: Payer: Self-pay | Admitting: Ophthalmology

## 2018-08-20 ENCOUNTER — Encounter: Payer: Self-pay | Admitting: *Deleted

## 2018-08-30 HISTORY — PX: CYSTOSCOPY WITH INSERTION OF UROLIFT: SHX6678

## 2018-09-01 ENCOUNTER — Ambulatory Visit: Payer: Medicare Other | Admitting: Anesthesiology

## 2018-09-01 ENCOUNTER — Other Ambulatory Visit: Payer: Self-pay

## 2018-09-01 ENCOUNTER — Ambulatory Visit
Admission: RE | Admit: 2018-09-01 | Discharge: 2018-09-01 | Disposition: A | Discharge status: Home / Self Care | Payer: Medicare Other | Attending: Ophthalmology | Admitting: Ophthalmology

## 2018-09-01 ENCOUNTER — Encounter: Payer: Self-pay | Admitting: *Deleted

## 2018-09-01 ENCOUNTER — Encounter
Admission: RE | Disposition: A | Discharge status: Home / Self Care | Payer: Self-pay | Source: Home / Self Care | Attending: Ophthalmology

## 2018-09-01 DIAGNOSIS — E1136 Type 2 diabetes mellitus with diabetic cataract: Secondary | ICD-10-CM | POA: Insufficient documentation

## 2018-09-01 DIAGNOSIS — F419 Anxiety disorder, unspecified: Secondary | ICD-10-CM | POA: Insufficient documentation

## 2018-09-01 DIAGNOSIS — M1990 Unspecified osteoarthritis, unspecified site: Secondary | ICD-10-CM | POA: Insufficient documentation

## 2018-09-01 DIAGNOSIS — I10 Essential (primary) hypertension: Secondary | ICD-10-CM | POA: Insufficient documentation

## 2018-09-01 DIAGNOSIS — G4730 Sleep apnea, unspecified: Secondary | ICD-10-CM | POA: Diagnosis not present

## 2018-09-01 DIAGNOSIS — H2511 Age-related nuclear cataract, right eye: Secondary | ICD-10-CM | POA: Diagnosis present

## 2018-09-01 DIAGNOSIS — D649 Anemia, unspecified: Secondary | ICD-10-CM | POA: Insufficient documentation

## 2018-09-01 DIAGNOSIS — Z96653 Presence of artificial knee joint, bilateral: Secondary | ICD-10-CM | POA: Insufficient documentation

## 2018-09-01 DIAGNOSIS — H9190 Unspecified hearing loss, unspecified ear: Secondary | ICD-10-CM | POA: Diagnosis not present

## 2018-09-01 DIAGNOSIS — K5790 Diverticulosis of intestine, part unspecified, without perforation or abscess without bleeding: Secondary | ICD-10-CM | POA: Diagnosis not present

## 2018-09-01 HISTORY — PX: CATARACT EXTRACTION W/PHACO: SHX586

## 2018-09-01 HISTORY — DX: Unspecified osteoarthritis, unspecified site: M19.90

## 2018-09-01 LAB — GLUCOSE, CAPILLARY: Glucose-Capillary: 188 mg/dL — ABNORMAL HIGH (ref 70–99)

## 2018-09-01 SURGERY — PHACOEMULSIFICATION, CATARACT, WITH IOL INSERTION
Anesthesia: Monitor Anesthesia Care | Site: Eye | Laterality: Right

## 2018-09-01 MED ORDER — SODIUM CHLORIDE 0.9 % IV SOLN
INTRAVENOUS | Status: DC
  Administered 2018-09-01: 07:00:00 via INTRAVENOUS

## 2018-09-01 MED ORDER — EPINEPHRINE PF 1 MG/ML IJ SOLN
INTRAOCULAR | Status: DC | PRN
  Administered 2018-09-01: 08:00:00 via OPHTHALMIC

## 2018-09-01 MED ORDER — CARBACHOL 0.01 % IO SOLN
INTRAOCULAR | Status: DC | PRN
  Administered 2018-09-01: 0.5 mL via INTRAOCULAR

## 2018-09-01 MED ORDER — MIDAZOLAM HCL 2 MG/2ML IJ SOLN
INTRAMUSCULAR | Status: DC | PRN
  Administered 2018-09-01 (×2): 1 mg via INTRAVENOUS

## 2018-09-01 MED ORDER — TETRACAINE HCL 0.5 % OP SOLN
OPHTHALMIC | Status: AC
  Administered 2018-09-01: 1 [drp] via OPHTHALMIC
  Filled 2018-09-01: qty 4

## 2018-09-01 MED ORDER — LIDOCAINE HCL (PF) 4 % IJ SOLN
INTRAOCULAR | Status: DC | PRN
  Administered 2018-09-01: 4 mL via OPHTHALMIC

## 2018-09-01 MED ORDER — NA CHONDROIT SULF-NA HYALURON 40-17 MG/ML IO SOLN
INTRAOCULAR | Status: AC
  Filled 2018-09-01: qty 1

## 2018-09-01 MED ORDER — POVIDONE-IODINE 5 % OP SOLN
OPHTHALMIC | Status: DC | PRN
  Administered 2018-09-01: 1 via OPHTHALMIC

## 2018-09-01 MED ORDER — ARMC OPHTHALMIC DILATING DROPS
1.0000 "application " | OPHTHALMIC | Status: AC
  Administered 2018-09-01 (×3): 1 via OPHTHALMIC

## 2018-09-01 MED ORDER — NA CHONDROIT SULF-NA HYALURON 40-17 MG/ML IO SOLN
INTRAOCULAR | Status: DC | PRN
  Administered 2018-09-01: 1 mL via INTRAOCULAR

## 2018-09-01 MED ORDER — EPINEPHRINE PF 1 MG/ML IJ SOLN
INTRAMUSCULAR | Status: AC
  Filled 2018-09-01: qty 2

## 2018-09-01 MED ORDER — ARMC OPHTHALMIC DILATING DROPS
OPHTHALMIC | Status: AC
  Administered 2018-09-01: 1 via OPHTHALMIC
  Filled 2018-09-01: qty 0.5

## 2018-09-01 MED ORDER — MOXIFLOXACIN HCL 0.5 % OP SOLN: 1.0000 [drp] | OPHTHALMIC | Status: DC | PRN

## 2018-09-01 MED ORDER — MIDAZOLAM HCL 2 MG/2ML IJ SOLN
INTRAMUSCULAR | Status: AC
  Filled 2018-09-01: qty 2

## 2018-09-01 MED ORDER — TETRACAINE HCL 0.5 % OP SOLN
1.0000 [drp] | Freq: Two times a day (BID) | OPHTHALMIC | Status: AC
  Administered 2018-09-01 (×3): 1 [drp] via OPHTHALMIC

## 2018-09-01 MED ORDER — POVIDONE-IODINE 5 % OP SOLN
OPHTHALMIC | Status: AC
  Filled 2018-09-01: qty 30

## 2018-09-01 MED ORDER — MOXIFLOXACIN HCL 0.5 % OP SOLN
OPHTHALMIC | Status: DC | PRN
  Administered 2018-09-01: 0.2 mL via OPHTHALMIC

## 2018-09-01 MED ORDER — LIDOCAINE HCL (PF) 4 % IJ SOLN
INTRAMUSCULAR | Status: AC
  Filled 2018-09-01: qty 5

## 2018-09-01 MED ORDER — MOXIFLOXACIN HCL 0.5 % OP SOLN
OPHTHALMIC | Status: AC
  Filled 2018-09-01: qty 3

## 2018-09-01 SURGICAL SUPPLY — 16 items
GLOVE BIO SURGEON STRL SZ8 (GLOVE) ×2 IMPLANT
GLOVE BIOGEL M 6.5 STRL (GLOVE) ×2 IMPLANT
GLOVE SURG LX 8.0 MICRO (GLOVE) ×1
GLOVE SURG LX STRL 8.0 MICRO (GLOVE) ×1 IMPLANT
GOWN STRL REUS W/ TWL LRG LVL3 (GOWN DISPOSABLE) ×2 IMPLANT
GOWN STRL REUS W/TWL LRG LVL3 (GOWN DISPOSABLE) ×4
LABEL CATARACT MEDS ST (LABEL) ×2 IMPLANT
LENS IOL TECNIS ITEC 20.0 (Intraocular Lens) ×1 IMPLANT
PACK CATARACT (MISCELLANEOUS) ×2 IMPLANT
PACK CATARACT BRASINGTON LX (MISCELLANEOUS) ×2 IMPLANT
PACK EYE AFTER SURG (MISCELLANEOUS) ×2 IMPLANT
SOL BSS BAG (MISCELLANEOUS) ×2
SOLUTION BSS BAG (MISCELLANEOUS) ×1 IMPLANT
SYR 5ML LL (SYRINGE) ×2 IMPLANT
WATER STERILE IRR 250ML POUR (IV SOLUTION) ×2 IMPLANT
WIPE NON LINTING 3.25X3.25 (MISCELLANEOUS) ×2 IMPLANT

## 2018-09-01 NOTE — Anesthesia Preprocedure Evaluation (Addendum)
Anesthesia Evaluation  Patient identified by MRN, date of birth, ID band Patient awake    Reviewed: Allergy & Precautions, H&P , NPO status , Patient's Chart, lab work & pertinent test results  History of Anesthesia Complications Negative for: history of anesthetic complications  Airway Mallampati: III  TM Distance: >3 FB     Dental  (+) Teeth Intact   Pulmonary neg shortness of breath, sleep apnea ,           Cardiovascular Exercise Tolerance: Good hypertension, (-) angina(-) Past MI and (-) DOE      Neuro/Psych PSYCHIATRIC DISORDERS Anxiety negative neurological ROS     GI/Hepatic negative GI ROS, Neg liver ROS,   Endo/Other  diabetes, Type 2  Renal/GU      Musculoskeletal  (+) Arthritis ,   Abdominal   Peds  Hematology negative hematology ROS (+)   Anesthesia Other Findings Past Medical History: No date: Anxiety No date: Diabetes mellitus No date: Diverticulosis No date: Hemorrhoids No date: HOH (hard of hearing)     Comment:  HOH No date: Hypertension No date: Normocytic anemia No date: Prostate disorder No date: Sleep apnea     Comment:  no cpap  Past Surgical History: No date: COLONOSCOPY 2014: JOINT REPLACEMENT; Bilateral     Comment:  Total knee replacement.  BMI    Body Mass Index:  37.42 kg/m      Reproductive/Obstetrics negative OB ROS                            Anesthesia Physical  Anesthesia Plan  ASA: III  Anesthesia Plan: MAC   Post-op Pain Management:    Induction: Intravenous  PONV Risk Score and Plan:   Airway Management Planned: Natural Airway and Nasal Cannula  Additional Equipment:   Intra-op Plan:   Post-operative Plan:   Informed Consent: I have reviewed the patients History and Physical, chart, labs and discussed the procedure including the risks, benefits and alternatives for the proposed anesthesia with the patient or authorized  representative who has indicated his/her understanding and acceptance.     Dental Advisory Given  Plan Discussed with: Anesthesiologist and CRNA  Anesthesia Plan Comments: (Patient consented for risks of anesthesia including but not limited to:  - adverse reactions to medications - damage to teeth, lips or other oral mucosa - sore throat or hoarseness - Damage to heart, brain, lungs or loss of life  Patient voiced understanding.)       Anesthesia Quick Evaluation

## 2018-09-01 NOTE — Discharge Instructions (Addendum)
Eye Surgery Discharge Instructions    Expect mild scratchy sensation or mild soreness. DO NOT RUB YOUR EYE!  The day of surgery:  Minimal physical activity, but bed rest is not required  No reading, computer work, or close hand work  No bending, lifting, or straining.  May watch TV  For 24 hours:  No driving, legal decisions, or alcoholic beverages  Safety precautions  Eat anything you prefer: It is better to start with liquids, then soup then solid foods.  _____ Eye patch should be worn until postoperative exam tomorrow.  ____ Solar shield eyeglasses should be worn for comfort in the sunlight/patch while sleeping  Resume all regular medications including aspirin or Coumadin if these were discontinued prior to surgery. You may shower, bathe, shave, or wash your hair. Tylenol may be taken for mild discomfort.  Call your doctor if you experience significant pain, nausea, or vomiting, fever > 101 or other signs of infection. [phone] or [phone] Specific instructions:  Follow-up Information    Birder Robson, MD Follow up on 09/02/2018.   Specialty:  Ophthalmology Why:  10:55 Contact information: [address] [place] [address] [phone]

## 2018-09-01 NOTE — Anesthesia Postprocedure Evaluation (Signed)
Anesthesia Post Note  Patient: [name] [name]  Procedure(s) Performed: CATARACT EXTRACTION PHACO AND INTRAOCULAR LENS PLACEMENT (IOC)-RIGHT, DIABETIC (Right Eye)  Patient location during evaluation: PACU Anesthesia Type: MAC Level of consciousness: awake and alert Pain management: pain level controlled Vital Signs Assessment: post-procedure vital signs reviewed and stable Respiratory status: spontaneous breathing, nonlabored ventilation, respiratory function stable and patient connected to nasal cannula oxygen Cardiovascular status: stable and blood pressure returned to baseline Postop Assessment: no apparent nausea or vomiting Anesthetic complications: no     Last Vitals:  Vitals:   09/01/18 0651 09/01/18 0816  BP: 133/74 136/75  Pulse: 72 66  Resp: 18 12  Temp: 36.6 C 36.7 C  SpO2: 99% 98%    Last Pain:  Vitals:   09/01/18 0816  TempSrc: Oral  PainSc:                  Estill Batten

## 2018-09-01 NOTE — Anesthesia Post-op Follow-up Note (Signed)
Anesthesia QCDR form completed.        

## 2018-09-01 NOTE — H&P (Signed)
All labs reviewed. Abnormal studies sent to patients PCP when indicated.  Previous H&P reviewed, patient examined, there are NO CHANGES.  Dela Sweeny Porfilio3/3/20207:45 AM

## 2018-09-01 NOTE — Op Note (Signed)
PREOPERATIVE DIAGNOSIS:  Nuclear sclerotic cataract of the right eye.   POSTOPERATIVE DIAGNOSIS:  NUCLEAR SCLEROTIC CATARACT RIGHT EYE   OPERATIVE PROCEDURE: Procedure(s): CATARACT EXTRACTION PHACO AND INTRAOCULAR LENS PLACEMENT (IOC)-RIGHT, DIABETIC   SURGEON:  Birder Robson, MD.   ANESTHESIA:  Anesthesiologist: Durenda Hurt, MD CRNA: Aline Brochure, CRNA  1.      Managed anesthesia care. 2.      0.71ml of Shugarcaine was instilled in the eye following the paracentesis.   COMPLICATIONS:  None.   TECHNIQUE:   Stop and chop   DESCRIPTION OF PROCEDURE:  The patient was examined and consented in the preoperative holding area where the aforementioned topical anesthesia was applied to the right eye and then brought back to the Operating Room where the right eye was prepped and draped in the usual sterile ophthalmic fashion and a lid speculum was placed. A paracentesis was created with the side port blade and the anterior chamber was filled with viscoelastic. A near clear corneal incision was performed with the steel keratome. A continuous curvilinear capsulorrhexis was performed with a cystotome followed by the capsulorrhexis forceps. Hydrodissection and hydrodelineation were carried out with BSS on a blunt cannula. The lens was removed in a stop and chop  technique and the remaining cortical material was removed with the irrigation-aspiration handpiece. The capsular bag was inflated with viscoelastic and the Technis ZCB00  lens was placed in the capsular bag without complication. The remaining viscoelastic was removed from the eye with the irrigation-aspiration handpiece. The wounds were hydrated. The anterior chamber was flushed with Miostat and the eye was inflated to physiologic pressure. 0.37ml of Vigamox was placed in the anterior chamber. The wounds were found to be water tight. The eye was dressed with Vigamox. The patient was given protective glasses to wear throughout the day  and a shield with which to sleep tonight. The patient was also given drops with which to begin a drop regimen today and will follow-up with me in one day. Implant Name Type Inv. Item Serial No. Manufacturer Lot No. LRB No. Used  LENS IOL DIOP 20.0 - [id number] Intraocular Lens LENS IOL DIOP 20.0 [id number] AMO  Right 1   Procedure(s) with comments: CATARACT EXTRACTION PHACO AND INTRAOCULAR LENS PLACEMENT (IOC)-RIGHT, DIABETIC (Right) - [place] 00:50.9 CDE 6.80 Fluid Pack Lot # [id number]    Electronically signed: Birder Robson 09/01/2018 8:13 AM

## 2018-09-01 NOTE — Transfer of Care (Signed)
Immediate Anesthesia Transfer of Care Note  Patient: [name] [name]  Procedure(s) Performed: CATARACT EXTRACTION PHACO AND INTRAOCULAR LENS PLACEMENT (IOC)-RIGHT, DIABETIC (Right Eye)  Patient Location: Short Stay  Anesthesia Type:MAC  Level of Consciousness: awake, alert  and oriented  Airway & Oxygen Therapy: Patient Spontanous Breathing  Post-op Assessment: Post -op Vital signs reviewed and stable  Post vital signs: stable  Last Vitals:  Vitals Value Taken Time  BP 136/75 09/01/2018  8:16 AM  Temp 36.7 C 09/01/2018  8:16 AM  Pulse 66 09/01/2018  8:16 AM  Resp 12 09/01/2018  8:16 AM  SpO2 98 % 09/01/2018  8:16 AM    Last Pain:  Vitals:   09/01/18 0816  TempSrc: Oral  PainSc:          Complications: No apparent anesthesia complications

## 2018-11-07 ENCOUNTER — Telehealth: Payer: Self-pay | Admitting: Gastroenterology

## 2018-11-07 NOTE — Telephone Encounter (Signed)
Received page from patient on callback.  Call patient spoke with him this afternoon.  He is endorsing 2 episodes of BRBPR.  No associated abdominal pain, fever, chills, nausea, vomiting.  States that he has been doing some heavy lifting lately and taking NSAIDs.  Chart reviewed and did have history of diverticular bleed in the past was last seen in our clinic in 2014.  Reports that he had 7 unit PRBC transfusion without admission in 2011.  Discussed possible etiologies, to include recurrent diverticular bleed, NSAID induced ulcer, hemorrhoids, ischemic colitis, etc.  Given age and described volume of bleeding, recommended going to the ER for exam and repeat labs.  He states that he otherwise feels well, and would prefer to stay at home and watch conservatively.  Offered that I am on call for our service all weekend and that should he decide to present to the ER, I would be the one who could assist taking care of him as needed.  He was grateful for the call back and advice.  If he does not decide to come to the ER, recommended that he call the office early next week to schedule a follow-up appointment.  He understood all recommendations.

## 2018-11-09 ENCOUNTER — Encounter: Payer: Self-pay | Admitting: General Surgery

## 2018-11-09 ENCOUNTER — Telehealth: Payer: Self-pay | Admitting: Internal Medicine

## 2018-11-09 NOTE — Telephone Encounter (Signed)
error 

## 2018-11-10 ENCOUNTER — Encounter: Payer: Self-pay | Admitting: Internal Medicine

## 2018-11-10 ENCOUNTER — Ambulatory Visit (INDEPENDENT_AMBULATORY_CARE_PROVIDER_SITE_OTHER): Payer: Medicare Other | Admitting: Internal Medicine

## 2018-11-10 ENCOUNTER — Other Ambulatory Visit: Payer: Self-pay

## 2018-11-10 ENCOUNTER — Telehealth: Payer: Self-pay

## 2018-11-10 VITALS — Ht 70.0 in | Wt 265.0 lb

## 2018-11-10 DIAGNOSIS — D62 Acute posthemorrhagic anemia: Secondary | ICD-10-CM

## 2018-11-10 DIAGNOSIS — Z1211 Encounter for screening for malignant neoplasm of colon: Secondary | ICD-10-CM

## 2018-11-10 DIAGNOSIS — K921 Melena: Secondary | ICD-10-CM | POA: Diagnosis not present

## 2018-11-10 DIAGNOSIS — K5731 Diverticulosis of large intestine without perforation or abscess with bleeding: Secondary | ICD-10-CM

## 2018-11-10 MED ORDER — NA SULFATE-K SULFATE-MG SULF 17.5-3.13-1.6 GM/177ML PO SOLN: ORAL | 0 refills | Status: DC

## 2018-11-10 NOTE — Addendum Note (Signed)
Addended by: Candie Mile on: 11/10/2018 02:17 PM   Modules accepted: Orders

## 2018-11-10 NOTE — Patient Instructions (Addendum)
If you are age 73 or older, your body mass index should be between 23-30. Your Body mass index is 38.02 kg/m. If this is out of the aforementioned range listed, please consider follow up with your Primary Care Provider.  If you are age 59 or younger, your body mass index should be between 19-25. Your Body mass index is 38.02 kg/m. If this is out of the aformentioned range listed, please consider follow up with your Primary Care Provider.   You have been scheduled for a colonoscopy. Please follow written instructions given to you at your visit today.  Please pick up your prep supplies at the pharmacy within the next 1-3 days. If you use inhalers (even only as needed), please bring them with you on the day of your procedure. Your physician has requested that you go to www.startemmi.com and enter the access code given to you at your visit today. This web site gives a general overview about your procedure. However, you should still follow specific instructions given to you by our office regarding your preparation for the procedure.  We have sent the following medications to your pharmacy for you to pick up at your convenience: Suprep  Thank you for choosing me and [facility].   Scarlette Shorts, MD

## 2018-11-10 NOTE — Telephone Encounter (Signed)
While going over instructions with patient, he wanted me to let Dr. [name] know that his Hgb has gone up to 9.  Also, he states he is taking Pantoprazole which I added to his list of meds.

## 2018-11-10 NOTE — Progress Notes (Signed)
HISTORY OF PRESENT ILLNESS:  [name] [name] is a 73 y.o. male, retired Education officer, [employer], with a history of diabetes mellitus, hypertension, sleep apnea, arthritis, and remote diverticular bleeding.  Schedules today's telehealth medicine visit during the coronavirus pandemic regarding recent problems with acute GI bleeding.  Patient underwent colonoscopy in 2004 and was found to have severe pandiverticulosis.  In 2008 he experienced an acute diverticular bleed that resolved with supportive care.  He did undergo upper endoscopy at that time which was negative except for mild duodenitis.  His last routine colonoscopy was performed December 01, 2008.  In addition to severe pandiverticulosis he was noted to have 2 diminutive colon polyps which were not adenomatous.  Thus, follow-up in 10 years recommended.  He was planning on his follow-up screening colonoscopy as scheduled.  He was in usual state of health until 3 days ago when while shopping in the grocery store developed urgency to defecate.  He describes gross rectal bleeding.  He went home and had a similar episode.  He did contact the GI physician on call for advice.  He did not go to the hospital.  Smaller episodes following day.  One darker bowel movement yesterday.  No bowel movement today but brown feces on wiping.  Review of outside blood work from his PCP obtained yesterday revealed a hemoglobin of 8.9.  Normal MCV.  Baseline hemoglobin is approximately 12  REVIEW OF SYSTEMS:  All non-GI ROS negative unless otherwise stated in the HPI except for arthritis  Past Medical History:  Diagnosis Date  . Anxiety   . Arthritis   . Diabetes mellitus   . Diverticulosis   . Hemorrhoids   . HOH (hard of hearing)    HOH  . Hypertension   . Normocytic anemia   . Prostate disorder   . Sleep apnea    no cpap    Past Surgical History:  Procedure Laterality Date  . CATARACT EXTRACTION W/PHACO Left 07/14/2018   Procedure: CATARACT EXTRACTION PHACO AND  INTRAOCULAR LENS PLACEMENT (IOC) LEFT, DIABETIC;  Surgeon: Birder Robson, MD;  Location: [facility] ORS;  Service: Ophthalmology;  Laterality: Left;  [place] 00:50 CDE 8.93 Fluid pack lot # [id number]  . CATARACT EXTRACTION W/PHACO Right 09/01/2018   Procedure: CATARACT EXTRACTION PHACO AND INTRAOCULAR LENS PLACEMENT (IOC)-RIGHT, DIABETIC;  Surgeon: Birder Robson, MD;  Location: [facility] ORS;  Service: Ophthalmology;  Laterality: Right;  [place] 00:50.9 CDE 6.80 Fluid Pack Lot # [id number]    . COLONOSCOPY    . CYSTOSCOPY WITH INSERTION OF UROLIFT    . CYSTOSCOPY WITH INSERTION OF UROLIFT  08/2018  . 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  . REPLACEMENT TOTAL KNEE Bilateral     Social History [name] [name]  reports that he has never smoked. He has never used smokeless tobacco. He reports current alcohol use. He reports that he does not use drugs.  family history includes Alzheimer's disease in his mother; Colon cancer in his sister; Diabetes in his mother.  No Known Allergies     PHYSICAL EXAMINATION: No physical exam with telehealth visit    ASSESSMENT:  1.  Painless hematochezia.  Resolved.  Certainly recurrent diverticular bleed. 2.  Acute blood loss anemia 3.  Last colonoscopy 10 years ago 4.  General medical problems.  Stable   PLAN:  1.  SCHEDULE colonoscopy in the [place].The nature of the procedure, as well as the risks, benefits, and alternatives were carefully and thoroughly reviewed with the patient. Ample time for discussion  and questions allowed. The patient understood, was satisfied, and agreed to proceed. 2.  Hold diabetic medications (Amaryl, Glucophage) the day of the procedure  This telehealth visit was initiated by the patient and consented for by the patient.  He was in his home and I was in my office during the encounter.  He understands there may be an associated professional charge for this service

## 2018-11-10 NOTE — Telephone Encounter (Signed)
Thanks

## 2018-11-30 ENCOUNTER — Telehealth: Payer: Self-pay

## 2018-11-30 NOTE — Telephone Encounter (Signed)
Covid-19 screening questions  Have you traveled in the last 14 days? No. If yes where?  Do you now or have you had a fever in the last 14 days? No.  Do you have any respiratory symptoms of shortness of breath or cough now or in the last 14 days? Patient has been short of breath but thinks its because of his anemia and age.   Do you have any family members or close contacts with diagnosed or suspected Covid-19 in the past 14 days? No.  Have you been tested for Covid-19 and found to be positive? No.

## 2018-12-02 ENCOUNTER — Ambulatory Visit (AMBULATORY_SURGERY_CENTER): Payer: Medicare Other | Admitting: Internal Medicine

## 2018-12-02 ENCOUNTER — Other Ambulatory Visit: Payer: Self-pay

## 2018-12-02 ENCOUNTER — Encounter: Payer: Self-pay | Admitting: Internal Medicine

## 2018-12-02 VITALS — BP 137/77 | HR 68 | Temp 98.4°F | Resp 17 | Ht 70.0 in | Wt 265.0 lb

## 2018-12-02 DIAGNOSIS — Z1211 Encounter for screening for malignant neoplasm of colon: Secondary | ICD-10-CM

## 2018-12-02 DIAGNOSIS — K5730 Diverticulosis of large intestine without perforation or abscess without bleeding: Secondary | ICD-10-CM

## 2018-12-02 DIAGNOSIS — K921 Melena: Secondary | ICD-10-CM

## 2018-12-02 DIAGNOSIS — K625 Hemorrhage of anus and rectum: Secondary | ICD-10-CM | POA: Diagnosis not present

## 2018-12-02 MED ORDER — SODIUM CHLORIDE 0.9 % IV SOLN: 500.0000 mL | Freq: Once | INTRAVENOUS | Status: DC

## 2018-12-02 NOTE — Progress Notes (Signed)
A and O x3. Report to RN. Tolerated MAC anesthesia well.

## 2018-12-02 NOTE — Progress Notes (Signed)
Pt's states no medical or surgical changes since previsit or office visit. 

## 2018-12-02 NOTE — Op Note (Signed)
[name] Procedure Date: 12/02/2018 11:21 AM MRN: [id number] Endoscopist: Docia Chuck. [name] [name] , MD Age: 73 Referring MD:  Date of Birth: Nov 12, 1945 Gender: Male Account #: [id number] Procedure:                Colonoscopy Indications:              Colon cancer screening. Previous examinations 2004                            and 2010. Recent self limited lower GI bleeding                            felt to be diverticular (prior history of the same). Medicines:                Monitored Anesthesia Care Procedure:                Pre-Anesthesia Assessment:                           - Prior to the procedure, a History and Physical                            was performed, and patient medications and                            allergies were reviewed. The patient's tolerance of                            previous anesthesia was also reviewed. The risks                            and benefits of the procedure and the sedation                            options and risks were discussed with the patient.                            All questions were answered, and informed consent                            was obtained. Prior Anticoagulants: The patient has                            taken no previous anticoagulant or antiplatelet                            agents. ASA Grade Assessment: II - A patient with                            mild systemic disease. After reviewing the risks                            and benefits, the patient was deemed in  satisfactory condition to undergo the procedure.                           After obtaining informed consent, the colonoscope                            was passed under direct vision. Throughout the                            procedure, the patient's blood pressure, pulse, and                            oxygen saturations were monitored continuously. The                            Colonoscope  was introduced through the anus and                            advanced to the the cecum, identified by                            appendiceal orifice and ileocecal valve. The                            ileocecal valve, appendiceal orifice, and rectum                            were photographed. The quality of the bowel                            preparation was excellent. The colonoscopy was                            performed without difficulty. The patient tolerated                            the procedure well. The bowel preparation used was                            SUPREP via split dose instruction. Scope In: 11:29:55 AM Scope Out: 11:44:23 AM Scope Withdrawal Time: 0 hours 10 minutes 37 seconds  Total Procedure Duration: 0 hours 14 minutes 28 seconds  Findings:                 Multiple small and large-mouthed diverticula were                            found in the entire colon. There was a 2 cm lipoma                            in the distal transverse colon.                           The exam was otherwise without abnormality on  direct and retroflexion views. Complications:            No immediate complications. Estimated blood loss:                            None. Estimated Blood Loss:     Estimated blood loss: none. Impression:               - Diverticulosis in the entire examined colon.                            Incidental lipoma.                           - The examination was otherwise normal on direct                            and retroflexion views.                           - No specimens collected. Recommendation:           - Repeat colonoscopy in 10 years for screening                            purposes.                           - Patient has a contact number available for                            emergencies. The signs and symptoms of potential                            delayed complications were discussed with the                             patient. Return to normal activities tomorrow.                            Written discharge instructions were provided to the                            patient.                           - Resume previous diet.                           - Continue present medications. Docia Chuck. [name] Pastor, MD 12/02/2018 12:04:53 PM This report has been signed electronically.

## 2018-12-02 NOTE — Patient Instructions (Addendum)
YOU HAD AN ENDOSCOPIC PROCEDURE TODAY AT THE [facility]:   Refer to the procedure report that was given to you for any specific questions about what was found during the examination.  If the procedure report does not answer your questions, please call your gastroenterologist to clarify.  If you requested that your care partner not be given the details of your procedure findings, then the procedure report has been included in a sealed envelope for you to review at your convenience later.  YOU SHOULD EXPECT: Some feelings of bloating in the abdomen. Passage of more gas than usual.  Walking can help get rid of the air that was put into your GI tract during the procedure and reduce the bloating. If you had a lower endoscopy (such as a colonoscopy or flexible sigmoidoscopy) you may notice spotting of blood in your stool or on the toilet paper. If you underwent a bowel prep for your procedure, you may not have a normal bowel movement for a few days.  Please Note:  You might notice some irritation and congestion in your nose or some drainage.  This is from the oxygen used during your procedure.  There is no need for concern and it should clear up in a day or so.  SYMPTOMS TO REPORT IMMEDIATELY:   Following lower endoscopy (colonoscopy or flexible sigmoidoscopy):  Excessive amounts of blood in the stool  Significant tenderness or worsening of abdominal pains  Swelling of the abdomen that is new, acute  Fever of 100F or higher  For urgent or emergent issues, a gastroenterologist can be reached at any hour by calling [phone].   DIET:  We do recommend a small meal at first, but then you may proceed to your regular diet.  Drink plenty of fluids but you should avoid alcoholic beverages for 24 hours.  ACTIVITY:  You should plan to take it easy for the rest of today and you should NOT DRIVE or use heavy machinery until tomorrow (because of the sedation medicines used during the test).     FOLLOW UP: Our staff will call the number listed on your records 48-72 hours following your procedure to check on you and address any questions or concerns that you may have regarding the information given to you following your procedure. If we do not reach you, we will leave a message.  We will attempt to reach you two times.  During this call, we will ask if you have developed any symptoms of COVID 19. If you develop any symptoms (ie: fever, flu-like symptoms, shortness of breath, cough etc.) before then, please call [phone].  If you test positive for Covid 19 in the 2 weeks post procedure, please call and report this information to us.    If any biopsies were taken you will be contacted by phone or by letter within the next 1-3 weeks.  Please call us at [phone] if you have not heard about the biopsies in 3 weeks.    SIGNATURES/CONFIDENTIALITY: You and/or your care partner have signed paperwork which will be entered into your electronic medical record.  These signatures attest to the fact that that the information above on your After Visit Summary has been reviewed and is understood.  Full responsibility of the confidentiality of this discharge information lies with you and/or your care-partner. 

## 2018-12-04 ENCOUNTER — Telehealth: Payer: Self-pay | Admitting: *Deleted

## 2018-12-04 NOTE — Telephone Encounter (Signed)
  Follow up Call-  Call back number 12/02/2018  Post procedure Call Back phone  # [phone]  Permission to leave phone message Yes  Some recent data might be hidden     Patient questions:  Do you have a fever, pain , or abdominal swelling? No. Pain Score  0 *  Have you tolerated food without any problems? Yes.    Have you been able to return to your normal activities? Yes.    Do you have any questions about your discharge instructions: Diet   No. Medications  No. Follow up visit  No.  Do you have questions or concerns about your Care? No.  Actions: * If pain score is 4 or above: No action needed, pain <4.   1. Have you developed a fever since your procedure? No  2.   Have you had an respiratory symptoms (SOB or cough) since your procedure? No  3.   Have you tested positive for COVID 19 since your procedure No  4.   Have you had any family members/close contacts diagnosed with the COVID 19 since your procedure?  No   If yes to any of these questions please route to Joylene John, RN and Alphonsa Gin, RN.

## 2019-01-13 ENCOUNTER — Telehealth: Payer: Self-pay | Admitting: Internal Medicine

## 2019-01-13 NOTE — Telephone Encounter (Signed)
Corene Cornea from [employer] office called stating he rec a colon report for the patient however the patient has not been seen at that office.

## 2019-01-14 NOTE — Telephone Encounter (Signed)
It looks like that report was sent by another MD.  Will make note that this pt has not been seen at that office.

## 2019-03-12 ENCOUNTER — Other Ambulatory Visit: Payer: Self-pay

## 2019-03-12 ENCOUNTER — Other Ambulatory Visit
Admission: RE | Admit: 2019-03-12 | Discharge: 2019-03-12 | Disposition: A | Discharge status: Home / Self Care | Payer: Medicare Other | Source: Ambulatory Visit | Attending: Internal Medicine | Admitting: Internal Medicine

## 2019-03-12 DIAGNOSIS — Z01812 Encounter for preprocedural laboratory examination: Secondary | ICD-10-CM | POA: Insufficient documentation

## 2019-03-12 DIAGNOSIS — Z20828 Contact with and (suspected) exposure to other viral communicable diseases: Secondary | ICD-10-CM | POA: Insufficient documentation

## 2019-03-13 LAB — SARS CORONAVIRUS 2 (TAT 6-24 HRS): SARS Coronavirus 2: NEGATIVE

## 2019-03-16 ENCOUNTER — Inpatient Hospital Stay: Payer: Medicare Other | Admitting: Oncology

## 2019-03-17 ENCOUNTER — Ambulatory Visit
Admission: RE | Admit: 2019-03-17 | Discharge: 2019-03-17 | Disposition: A | Discharge status: Home / Self Care | Payer: Medicare Other | Attending: Internal Medicine | Admitting: Internal Medicine

## 2019-03-17 ENCOUNTER — Ambulatory Visit: Payer: Medicare Other | Admitting: Anesthesiology

## 2019-03-17 ENCOUNTER — Encounter
Admission: RE | Disposition: A | Discharge status: Home / Self Care | Payer: Self-pay | Source: Home / Self Care | Attending: Internal Medicine

## 2019-03-17 ENCOUNTER — Other Ambulatory Visit: Payer: Self-pay

## 2019-03-17 ENCOUNTER — Encounter: Payer: Self-pay | Admitting: *Deleted

## 2019-03-17 DIAGNOSIS — F419 Anxiety disorder, unspecified: Secondary | ICD-10-CM | POA: Diagnosis not present

## 2019-03-17 DIAGNOSIS — E669 Obesity, unspecified: Secondary | ICD-10-CM | POA: Diagnosis not present

## 2019-03-17 DIAGNOSIS — Z6839 Body mass index (BMI) 39.0-39.9, adult: Secondary | ICD-10-CM | POA: Diagnosis not present

## 2019-03-17 DIAGNOSIS — D509 Iron deficiency anemia, unspecified: Secondary | ICD-10-CM | POA: Insufficient documentation

## 2019-03-17 DIAGNOSIS — E119 Type 2 diabetes mellitus without complications: Secondary | ICD-10-CM | POA: Diagnosis not present

## 2019-03-17 DIAGNOSIS — Z7982 Long term (current) use of aspirin: Secondary | ICD-10-CM | POA: Insufficient documentation

## 2019-03-17 DIAGNOSIS — G4730 Sleep apnea, unspecified: Secondary | ICD-10-CM | POA: Diagnosis not present

## 2019-03-17 DIAGNOSIS — Z79899 Other long term (current) drug therapy: Secondary | ICD-10-CM | POA: Diagnosis not present

## 2019-03-17 DIAGNOSIS — Z7984 Long term (current) use of oral hypoglycemic drugs: Secondary | ICD-10-CM | POA: Diagnosis not present

## 2019-03-17 DIAGNOSIS — M1990 Unspecified osteoarthritis, unspecified site: Secondary | ICD-10-CM | POA: Diagnosis not present

## 2019-03-17 DIAGNOSIS — I10 Essential (primary) hypertension: Secondary | ICD-10-CM | POA: Diagnosis not present

## 2019-03-17 DIAGNOSIS — N429 Disorder of prostate, unspecified: Secondary | ICD-10-CM | POA: Insufficient documentation

## 2019-03-17 HISTORY — PX: ESOPHAGOGASTRODUODENOSCOPY (EGD) WITH PROPOFOL: SHX5813

## 2019-03-17 LAB — GLUCOSE, CAPILLARY: Glucose-Capillary: 129 mg/dL — ABNORMAL HIGH (ref 70–99)

## 2019-03-17 SURGERY — ESOPHAGOGASTRODUODENOSCOPY (EGD) WITH PROPOFOL
Anesthesia: General

## 2019-03-17 MED ORDER — LIDOCAINE HCL (PF) 2 % IJ SOLN
INTRAMUSCULAR | Status: DC | PRN
  Administered 2019-03-17: 100 mg via INTRADERMAL

## 2019-03-17 MED ORDER — PROPOFOL 500 MG/50ML IV EMUL
INTRAVENOUS | Status: AC
  Filled 2019-03-17: qty 50

## 2019-03-17 MED ORDER — PROPOFOL 10 MG/ML IV BOLUS
INTRAVENOUS | Status: DC | PRN
  Administered 2019-03-17: 20 mg via INTRAVENOUS
  Administered 2019-03-17: 8 mg via INTRAVENOUS

## 2019-03-17 MED ORDER — SODIUM CHLORIDE 0.9 % IV SOLN
INTRAVENOUS | Status: DC
  Administered 2019-03-17: 08:00:00 via INTRAVENOUS

## 2019-03-17 MED ORDER — PROPOFOL 500 MG/50ML IV EMUL
INTRAVENOUS | Status: DC | PRN
  Administered 2019-03-17: 150 ug/kg/min via INTRAVENOUS

## 2019-03-17 NOTE — Interval H&P Note (Signed)
History and Physical Interval Note:  03/17/2019 8:20 AM  [name] [name]  has presented today for surgery, with the diagnosis of IDA.  The various methods of treatment have been discussed with the patient and family. After consideration of risks, benefits and other options for treatment, the patient has consented to  Procedure(s): ESOPHAGOGASTRODUODENOSCOPY (EGD) WITH PROPOFOL (N/A) as a surgical intervention.  The patient's history has been reviewed, patient examined, no change in status, stable for surgery.  I have reviewed the patient's chart and labs.  Questions were answered to the patient's satisfaction.     [place], [place]

## 2019-03-17 NOTE — Transfer of Care (Signed)
Immediate Anesthesia Transfer of Care Note  Patient: [name] [name]  Procedure(s) Performed: ESOPHAGOGASTRODUODENOSCOPY (EGD) WITH PROPOFOL (N/A )  Patient Location: PACU  Anesthesia Type:General  Level of Consciousness: awake, alert  and oriented  Airway & Oxygen Therapy: Patient Spontanous Breathing and Patient connected to nasal cannula oxygen  Post-op Assessment: Report given to RN and Post -op Vital signs reviewed and stable  Post vital signs: Reviewed and stable  Last Vitals:  Vitals Value Taken Time  BP 128/88 03/17/19 0845  Temp    Pulse 66 03/17/19 0847  Resp 14 03/17/19 0847  SpO2 99 % 03/17/19 0847  Vitals shown include unvalidated device data.  Last Pain:  Vitals:   03/17/19 0844  TempSrc:   PainSc: 0-No pain         Complications: No apparent anesthesia complications

## 2019-03-17 NOTE — Anesthesia Procedure Notes (Signed)
Date/Time: 03/17/2019 8:36 AM Performed by: Nelda Marseille, CRNA Pre-anesthesia Checklist: Patient identified, Emergency Drugs available, Suction available and Patient being monitored Oxygen Delivery Method: Nasal cannula

## 2019-03-17 NOTE — Anesthesia Preprocedure Evaluation (Signed)
Anesthesia Evaluation  Patient identified by MRN, date of birth, ID band Patient awake    Reviewed: Allergy & Precautions, NPO status , Patient's Chart, lab work & pertinent test results  History of Anesthesia Complications Negative for: history of anesthetic complications  Airway Mallampati: II  TM Distance: >3 FB Neck ROM: Full    Dental  (+) Missing   Pulmonary sleep apnea , neg COPD,    breath sounds clear to auscultation- rhonchi (-) wheezing      Cardiovascular hypertension, Pt. on medications (-) CAD, (-) Past MI, (-) Cardiac Stents and (-) CABG  Rhythm:Regular Rate:Normal - Systolic murmurs and - Diastolic murmurs    Neuro/Psych neg Seizures Anxiety negative neurological ROS     GI/Hepatic negative GI ROS, Neg liver ROS,   Endo/Other  diabetes, Oral Hypoglycemic Agents  Renal/GU negative Renal ROS     Musculoskeletal  (+) Arthritis ,   Abdominal (+) + obese,   Peds  Hematology  (+) anemia ,   Anesthesia Other Findings Past Medical History: No date: Anxiety No date: Arthritis No date: Diabetes mellitus No date: Diverticulosis No date: Hemorrhoids No date: HOH (hard of hearing)     Comment:  HOH No date: Hypertension No date: Normocytic anemia No date: Prostate disorder No date: Sleep apnea     Comment:  no cpap   Reproductive/Obstetrics                             Anesthesia Physical Anesthesia Plan  ASA: III  Anesthesia Plan: General   Post-op Pain Management:    Induction: Intravenous  PONV Risk Score and Plan: 1 and Propofol infusion  Airway Management Planned: Natural Airway  Additional Equipment:   Intra-op Plan:   Post-operative Plan:   Informed Consent: I have reviewed the patients History and Physical, chart, labs and discussed the procedure including the risks, benefits and alternatives for the proposed anesthesia with the patient or authorized  representative who has indicated his/her understanding and acceptance.     Dental advisory given  Plan Discussed with: CRNA and Anesthesiologist  Anesthesia Plan Comments:         Anesthesia Quick Evaluation

## 2019-03-17 NOTE — Anesthesia Post-op Follow-up Note (Signed)
Anesthesia QCDR form completed.        

## 2019-03-17 NOTE — Op Note (Signed)
[facility]astroenter[facility] Patient Name: [name] [name] Procedure Date: 03/17/2019 8:03 AM MRN: [id number] Account #: [id number] Date of Birth: 24-Jan-1946 Admit Type: Outpatient Age: 73 Room: [facility] ENDO ROOM 3 Gender: Male Note Status: Finalized Procedure:            Upper GI endoscopy Indications:          Suspected upper gastrointestinal bleeding in patient                        with unexplained iron deficiency anemia Providers:            Benay Pike. Hellon Vaccarella MD, MD Medicines:            Propofol per Anesthesia Complications:        No immediate complications. Procedure:            Pre-Anesthesia Assessment:                       - The risks and benefits of the procedure and the                        sedation options and risks were discussed with the                        patient. All questions were answered and informed                        consent was obtained.                       - Patient identification and proposed procedure were                        verified prior to the procedure by the nurse. The                        procedure was verified in the procedure room.                       - ASA Grade Assessment: III - A patient with severe                        systemic disease.                       - After reviewing the risks and benefits, the patient                        was deemed in satisfactory condition to undergo the                        procedure.                       After obtaining informed consent, the endoscope was                        passed under direct vision. Throughout the procedure,                        the patient's blood pressure, pulse, and oxygen  saturations were monitored continuously. The Endoscope                        was introduced through the mouth, and advanced to the                        third part of duodenum. The upper GI endoscopy was                        accomplished without  difficulty. The patient tolerated                        the procedure well. Findings:      The esophagus was normal.      The stomach was normal.      The examined duodenum was normal.      Biopsies for histology were taken with a cold forceps in the first       portion of the duodenum and in the third portion of the duodenum for       evaluation of celiac disease.      The exam was otherwise without abnormality. Impression:           - Normal esophagus.                       - Normal stomach.                       - Normal examined duodenum.                       - The examination was otherwise normal.                       - Biopsies were taken with a cold forceps for                        evaluation of celiac disease. Recommendation:       - Await pathology results.                       - To visualize the small bowel, perform video capsule                        endoscopy at appointment to be scheduled. Procedure Code(s):    --- Professional ---                       [phone], Esophagogastroduodenoscopy, flexible, transoral;                        with biopsy, single or multiple Diagnosis Code(s):    --- Professional ---                       D50.9, Iron deficiency anemia, unspecified CPT copyright 2019 American Medical Association. All rights reserved. The codes documented in this report are preliminary and upon coder review may  be revised to meet current compliance requirements. Efrain Sella MD, MD 03/17/2019 8:40:08 AM This report has been signed electronically. Number of Addenda: 0 Note Initiated On: 03/17/2019 8:03 AM Estimated Blood Loss: Estimated blood loss: none.      [facility]

## 2019-03-17 NOTE — Anesthesia Postprocedure Evaluation (Signed)
Anesthesia Post Note  Patient: [name] [name]  Procedure(s) Performed: ESOPHAGOGASTRODUODENOSCOPY (EGD) WITH PROPOFOL (N/A )  Patient location during evaluation: Endoscopy Anesthesia Type: General Level of consciousness: awake and alert and oriented Pain management: pain level controlled Vital Signs Assessment: post-procedure vital signs reviewed and stable Respiratory status: spontaneous breathing, nonlabored ventilation and respiratory function stable Cardiovascular status: blood pressure returned to baseline and stable Postop Assessment: no signs of nausea or vomiting Anesthetic complications: no     Last Vitals:  Vitals:   03/17/19 0914 03/17/19 0924  BP: (!) 150/89 (!) 152/89  Pulse: 62 60  Resp: 15 15  Temp:    SpO2: 100% 100%    Last Pain:  Vitals:   03/17/19 0924  TempSrc:   PainSc: 0-No pain                 Decarla Siemen

## 2019-03-17 NOTE — H&P (Signed)
  Outpatient short stay form Pre-procedure 03/17/2019 8:17 AM  K. Alice Reichert, M.D.  Primary Physician: Unice Bailey, M.D.  Reason for visit: Iron deficiency anemia  History of present illness:  Patient with colon in 2019 which was negative. No recent EGD.Has IDA. No overt bleeding symptoms.    Current Facility-Administered Medications:  .  0.9 %  sodium chloride infusion, , Intravenous, Continuous, [place], Benay Pike, MD, Last Rate: 20 mL/hr at 03/17/19 [id number]  Medications Prior to Admission  Medication Sig Dispense Refill Last Dose  . ALPRAZolam (XANAX) 1 MG tablet Take 1 tablet (1 mg total) by mouth daily as needed. (Patient taking differently: Take 1 mg by mouth at bedtime as needed for sleep. ) 30 tablet 0 Past Week at Unknown time  . amLODipine (NORVASC) 10 MG tablet Take 1 tablet (10 mg total) by mouth daily. 90 tablet 0 03/16/2019 at Unknown time  . aspirin EC 81 MG tablet Take 81 mg by mouth daily.   Past Week at Unknown time  . atorvastatin (LIPITOR) 20 MG tablet Take 20 mg by mouth daily.   03/16/2019 at Unknown time  . benazepril (LOTENSIN) 40 MG tablet Take 40 mg by mouth daily.   03/16/2019 at Unknown time  . glimepiride (AMARYL) 2 MG tablet Take 2 mg by mouth daily with breakfast.   03/16/2019 at Unknown time  . ibuprofen (ADVIL,MOTRIN) 200 MG tablet Take 400 mg by mouth every 6 (six) hours as needed for moderate pain.    Past Week at Unknown time  . metFORMIN (GLUCOPHAGE-XR) 500 MG 24 hr tablet Take 1,000 mg by mouth every evening.   03/16/2019 at Unknown time  . sertraline (ZOLOFT) 50 MG tablet Take 50 mg by mouth daily.   03/16/2019 at Unknown time  . terazosin (HYTRIN) 5 MG capsule Take 5 mg by mouth every evening.   03/16/2019 at Unknown time  . acetaminophen (TYLENOL) 500 MG tablet Take 1,000 mg by mouth every 6 (six) hours as needed for moderate pain.         No Known Allergies   Past Medical History:  Diagnosis Date  . Anxiety   . Arthritis   . Diabetes  mellitus   . Diverticulosis   . Hemorrhoids   . HOH (hard of hearing)    HOH  . Hypertension   . Normocytic anemia   . Prostate disorder   . Sleep apnea    no cpap    Review of systems:  Otherwise negative.    Physical Exam  Gen: Alert, oriented. Appears stated age.  HEENT: [place]/AT. PERRLA. Lungs: CTA, no wheezes. CV: RR nl S1, S2. Abd: soft, benign, no masses. BS+ Ext: No edema. Pulses 2+    Planned procedures: Proceed with EGD. The patient understands the nature of the planned procedure, indications, risks, alternatives and potential complications including but not limited to bleeding, infection, perforation, damage to internal organs and possible oversedation/side effects from anesthesia. The patient agrees and gives consent to proceed.  Please refer to procedure notes for findings, recommendations and patient disposition/instructions.      K. Alice Reichert, M.D. Gastroenterology 03/17/2019  8:17 AM

## 2019-03-18 ENCOUNTER — Other Ambulatory Visit: Payer: Self-pay

## 2019-03-18 ENCOUNTER — Encounter: Payer: Self-pay | Admitting: Internal Medicine

## 2019-03-18 NOTE — Progress Notes (Signed)
Patient pre screened for office appointment, no questions or concerns today. 

## 2019-03-19 ENCOUNTER — Inpatient Hospital Stay: Payer: Medicare Other

## 2019-03-19 ENCOUNTER — Inpatient Hospital Stay: Payer: Medicare Other | Attending: Oncology | Admitting: Oncology

## 2019-03-19 ENCOUNTER — Other Ambulatory Visit: Payer: Self-pay

## 2019-03-19 VITALS — BP 139/77 | HR 77 | Temp 95.7°F | Ht 70.0 in | Wt 288.0 lb

## 2019-03-19 DIAGNOSIS — Z833 Family history of diabetes mellitus: Secondary | ICD-10-CM | POA: Diagnosis not present

## 2019-03-19 DIAGNOSIS — Z7289 Other problems related to lifestyle: Secondary | ICD-10-CM | POA: Diagnosis not present

## 2019-03-19 DIAGNOSIS — M1990 Unspecified osteoarthritis, unspecified site: Secondary | ICD-10-CM | POA: Diagnosis not present

## 2019-03-19 DIAGNOSIS — Z800 Family history of malignant neoplasm of digestive organs: Secondary | ICD-10-CM

## 2019-03-19 DIAGNOSIS — Z8719 Personal history of other diseases of the digestive system: Secondary | ICD-10-CM | POA: Insufficient documentation

## 2019-03-19 DIAGNOSIS — D509 Iron deficiency anemia, unspecified: Secondary | ICD-10-CM

## 2019-03-19 DIAGNOSIS — Z820 Family history of epilepsy and other diseases of the nervous system: Secondary | ICD-10-CM

## 2019-03-19 DIAGNOSIS — R5383 Other fatigue: Secondary | ICD-10-CM

## 2019-03-19 DIAGNOSIS — D649 Anemia, unspecified: Secondary | ICD-10-CM

## 2019-03-19 DIAGNOSIS — Z79899 Other long term (current) drug therapy: Secondary | ICD-10-CM | POA: Diagnosis not present

## 2019-03-19 LAB — RETICULOCYTES
Immature Retic Fract: 14.5 % (ref 2.3–15.9)
RBC.: 4.1 MIL/uL — ABNORMAL LOW (ref 4.22–5.81)
Retic Count, Absolute: 66 10*3/uL (ref 19.0–186.0)
Retic Ct Pct: 1.6 % (ref 0.4–3.1)

## 2019-03-19 LAB — TSH: TSH: 1.2 u[IU]/mL (ref 0.350–4.500)

## 2019-03-19 LAB — VITAMIN B12: Vitamin B-12: 159 pg/mL — ABNORMAL LOW (ref 180–914)

## 2019-03-19 LAB — FOLATE: Folate: 10.6 ng/mL (ref >5.9)

## 2019-03-20 LAB — HAPTOGLOBIN: Haptoglobin: 162 mg/dL (ref 34–355)

## 2019-03-22 ENCOUNTER — Encounter: Payer: Self-pay | Admitting: Oncology

## 2019-03-22 ENCOUNTER — Inpatient Hospital Stay: Payer: Medicare Other

## 2019-03-22 ENCOUNTER — Other Ambulatory Visit: Payer: Self-pay

## 2019-03-22 VITALS — BP 128/74 | HR 72 | Temp 97.8°F | Resp 18

## 2019-03-22 DIAGNOSIS — D509 Iron deficiency anemia, unspecified: Secondary | ICD-10-CM | POA: Diagnosis not present

## 2019-03-22 MED ORDER — SODIUM CHLORIDE 0.9 % IV SOLN: 200.0000 mg | Freq: Once | INTRAVENOUS | Status: DC

## 2019-03-22 MED ORDER — SODIUM CHLORIDE 0.9 % IV SOLN
Freq: Once | INTRAVENOUS | Status: AC
  Administered 2019-03-22: 14:00:00 via INTRAVENOUS
  Filled 2019-03-22: qty 250

## 2019-03-22 MED ORDER — IRON SUCROSE 20 MG/ML IV SOLN
200.0000 mg | Freq: Once | INTRAVENOUS | Status: AC
  Administered 2019-03-22: 200 mg via INTRAVENOUS
  Filled 2019-03-22: qty 10

## 2019-03-22 MED ORDER — CYANOCOBALAMIN 1000 MCG/ML IJ SOLN
1000.0000 ug | INTRAMUSCULAR | Status: DC
  Administered 2019-03-22: 1000 ug via INTRAMUSCULAR
  Filled 2019-03-22: qty 1

## 2019-03-22 NOTE — Progress Notes (Signed)
Hematology/Oncology Consult note [facility] Telephone:([phone] [phone]  Patient Care Team: Unice Bailey, MD as PCP - General (Rheumatology) Marry Guan, Laurice Record, MD (Orthopedic Surgery) Nobie Putnam, MD (Hematology and Oncology)   Name of the patient: [name] [name]  [id number]  07-20-1945    Reason for referral-iron deficiency anemia   Referring [place]  Date of visit: 03/22/19   History of presenting illness- Patient is a 73 year old male who was seen by Dr. Alice Reichert for iron deficiency anemia.  Most recent CBC from 02/08/2019 showed white count of 8.9, H&H of 10.9/33.5 and a platelet count of 378.  Iron studies from 03/02/2019 showed a ferritin of 11 and increased TIBC of 455.  CMP was normal except for mildly low sodium of 135.  Patient has previously undergone colonoscopy with lumbar GI.  He underwent EGD with Dr. Alice Reichert which did not show any evidence of active bleeding and he will be going through a capsule endoscopy soon.  Biopsies were negative for celiac disease.  He has been referred to [place] for consideration for IV iron infusion.  Patient reports being very active for his age and is independent of his ADLs and IADLs.  He currently reports feeling fatigued.  Denies any unintentional weight loss.  ECOG PS- 0  Pain scale- 0   Review of systems- Review of Systems  Constitutional: Positive for malaise/fatigue. Negative for chills, fever and weight loss.  HENT: Negative for congestion, ear discharge and nosebleeds.   Eyes: Negative for blurred vision.  Respiratory: Negative for cough, hemoptysis, sputum production, shortness of breath and wheezing.   Cardiovascular: Negative for chest pain, palpitations, orthopnea and claudication.  Gastrointestinal: Negative for abdominal pain, blood in stool, constipation, diarrhea, heartburn, melena, nausea and vomiting.  Genitourinary: Negative for dysuria, flank pain, frequency, hematuria and  urgency.  Musculoskeletal: Negative for back pain, joint pain and myalgias.  Skin: Negative for rash.  Neurological: Negative for dizziness, tingling, focal weakness, seizures, weakness and headaches.  Endo/Heme/Allergies: Does not bruise/bleed easily.  Psychiatric/Behavioral: Negative for depression and suicidal ideas. The patient does not have insomnia.     No Known Allergies  Patient Active Problem List   Diagnosis Date Noted  . Iron deficiency anemia 03/19/2019  . Anxiety 12/15/2014  . At risk for falling 12/15/2014  . Diabetes mellitus type 2, controlled ([place]) 12/15/2014  . Dyssomnia 12/15/2014  . BP (high blood pressure) 12/15/2014  . Cannot sleep 12/15/2014  . Pain in joint, lower leg 12/15/2014  . Adiposity 12/15/2014  . Screening for gout 12/15/2014  . Avitaminosis D 12/15/2014  . Absolute anemia 12/15/2014  . Bilateral chronic knee pain 12/15/2014  . Insomnia 12/15/2014  . Elevated platelet count 12/15/2014  . Hyperlipidemia 12/15/2014  . Hemorrhoids   . ANEMIA-UNSPECIFIED 01/19/2010  . DM 10/31/2008  . DIVERTICULOSIS OF COLON 10/31/2008  . RECTAL BLEEDING 10/31/2008     Past Medical History:  Diagnosis Date  . Anxiety   . Arthritis   . Diabetes mellitus   . Diverticulosis   . Hemorrhoids   . HOH (hard of hearing)    HOH  . Hypertension   . Normocytic anemia   . Prostate disorder   . Sleep apnea    no cpap     Past Surgical History:  Procedure Laterality Date  . CATARACT EXTRACTION W/PHACO Left 07/14/2018   Procedure: CATARACT EXTRACTION PHACO AND INTRAOCULAR LENS PLACEMENT (IOC) LEFT, DIABETIC;  Surgeon: Birder Robson, MD;  Location: [facility] ORS;  Service: Ophthalmology;  Laterality: Left;  [place] 00:50 CDE 8.93 Fluid pack lot # [id number]  . CATARACT EXTRACTION W/PHACO Right 09/01/2018   Procedure: CATARACT EXTRACTION PHACO AND INTRAOCULAR LENS PLACEMENT (IOC)-RIGHT, DIABETIC;  Surgeon: Birder Robson, MD;  Location: [facility] ORS;  Service:  Ophthalmology;  Laterality: Right;  [place] 00:50.9 CDE 6.80 Fluid Pack Lot # [id number]    . COLONOSCOPY    . CYSTOSCOPY WITH INSERTION OF UROLIFT    . CYSTOSCOPY WITH INSERTION OF UROLIFT  08/2018  . ESOPHAGOGASTRODUODENOSCOPY (EGD) WITH PROPOFOL N/A 03/17/2019   Procedure: ESOPHAGOGASTRODUODENOSCOPY (EGD) WITH PROPOFOL;  Surgeon: Toledo, Benay Pike, MD;  Location: [facility] ENDOSCOPY;  Service: Gastroenterology;  Laterality: N/A;  . 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  . REPLACEMENT TOTAL KNEE Bilateral     Social History   Socioeconomic History  . Marital status: Married    Spouse name: Bonnita Nasuti  . Number of children: 3  . Years of education: Not on file  . Highest education level: Not on file  Occupational History  . Not on file  Social Needs  . Financial resource strain: Not on file  . Food insecurity    Worry: Not on file    Inability: Not on file  . Transportation needs    Medical: Not on file    Non-medical: Not on file  Tobacco Use  . Smoking status: Never Smoker  . Smokeless tobacco: Never Used  Substance and Sexual Activity  . Alcohol use: Yes    Alcohol/week: 0.0 standard drinks    Comment: 3x week vodka  . Drug use: Never  . Sexual activity: Not on file  Lifestyle  . Physical activity    Days per week: Not on file    Minutes per session: Not on file  . Stress: Not on file  Relationships  . Social Herbalist on phone: Not on file    Gets together: Not on file    Attends religious service: Not on file    Active member of club or organization: Not on file    Attends meetings of clubs or organizations: Not on file    Relationship status: Not on file  . Intimate partner violence    Fear of current or ex partner: Not on file    Emotionally abused: Not on file    Physically abused: Not on file    Forced sexual activity: Not on file  Other Topics Concern  . Not on file  Social History Narrative  . Not on file     Family History   Problem Relation Age of Onset  . Diabetes Mother   . Alzheimer's disease Mother   . Colon cancer Sister      Current Outpatient Medications:  .  acetaminophen (TYLENOL) 500 MG tablet, Take 1,000 mg by mouth every 6 (six) hours as needed for moderate pain. , Disp: , Rfl:  .  ALPRAZolam (XANAX) 1 MG tablet, Take 1 tablet (1 mg total) by mouth daily as needed. (Patient taking differently: Take 1 mg by mouth at bedtime as needed for sleep. ), Disp: 30 tablet, Rfl: 0 .  amLODipine (NORVASC) 10 MG tablet, Take 1 tablet (10 mg total) by mouth daily., Disp: 90 tablet, Rfl: 0 .  atorvastatin (LIPITOR) 20 MG tablet, Take 20 mg by mouth daily., Disp: , Rfl:  .  benazepril (LOTENSIN) 40 MG tablet, Take 40 mg by mouth daily., Disp: , Rfl:  .  glimepiride (AMARYL) 2 MG tablet, Take 2 mg  by mouth daily with breakfast., Disp: , Rfl:  .  metFORMIN (GLUCOPHAGE-XR) 500 MG 24 hr tablet, Take 1,000 mg by mouth every evening., Disp: , Rfl:  .  sertraline (ZOLOFT) 50 MG tablet, Take 50 mg by mouth daily., Disp: , Rfl:  .  terazosin (HYTRIN) 5 MG capsule, Take 5 mg by mouth every evening., Disp: , Rfl:  .  aspirin EC 81 MG tablet, Take 81 mg by mouth daily., Disp: , Rfl:  .  ibuprofen (ADVIL,MOTRIN) 200 MG tablet, Take 400 mg by mouth every 6 (six) hours as needed for moderate pain. , Disp: , Rfl:    Physical exam:  Vitals:   03/19/19 1030  BP: 139/77  Pulse: 77  Temp: (!) 95.7 F (35.4 C)  TempSrc: Tympanic  SpO2: 100%  Weight: 288 lb (130.6 kg)  Height: 5\' 10"  (1.778 m)   Physical Exam Constitutional:      Appearance: He is obese.  HENT:     Head: Normocephalic and atraumatic.  Eyes:     Pupils: Pupils are equal, round, and reactive to light.  Neck:     Musculoskeletal: Normal range of motion.  Cardiovascular:     Rate and Rhythm: Normal rate and regular rhythm.     Heart sounds: Normal heart sounds.  Pulmonary:     Effort: Pulmonary effort is normal.     Breath sounds: Normal breath  sounds.  Abdominal:     General: Bowel sounds are normal.     Palpations: Abdomen is soft.  Skin:    General: Skin is warm and dry.  Neurological:     Mental Status: He is alert and oriented to person, place, and time.        CMP Latest Ref Rng & Units 12/20/2014  Glucose 65 - 99 mg/dL 132(H)  BUN 8 - 27 mg/dL 9  Creatinine 0.76 - 1.27 mg/dL 0.58(L)  Sodium 134 - 144 mmol/L 134  Potassium 3.5 - 5.2 mmol/L 4.3  Chloride 97 - 108 mmol/L 94(L)  CO2 18 - 29 mmol/L 26  Calcium 8.6 - 10.2 mg/dL 9.1  Total Protein 6.0 - 8.5 g/dL 6.5  Total Bilirubin 0.0 - 1.2 mg/dL 0.3  Alkaline Phos 39 - 117 IU/L 80  AST 0 - 40 IU/L 14  ALT 0 - 44 IU/L 15   CBC Latest Ref Rng & Units 12/20/2014  WBC 3.4 - 10.8 x10E3/uL 6.8  Hemoglobin 12.6 - 17.7 g/dL 11.9(L)  Hematocrit 37.5 - 51.0 % 35.1(L)  Platelets 150 - 379 x10E3/uL 375     Assessment and plan- Patient is a 73 y.o. male referred for iron deficiency anemia:  Most recent labs done at [facility] clinic showed a hemoglobin of 10 and evidence of iron deficiency.  I therefore recommend IV iron for him.  His insurance would only approve Venofer and he will therefore receive 200 mg per dose IV for 5 doses over the next 3 weeks.  I will also be checking other anemia work-up including folate, haptoglobin, reticulocyte count, TSH and B12 today.  Discussed risks and benefits of IV iron including all but not limited to headache, leg swelling and possible risk of infusion reaction.  Patient understands and agrees to proceed.  I will see him back in 2 months time with CBC ferritin and iron studies  Patient is currently undergoing GI work-up with Dr. Alice Reichert.   Thank you for this kind referral and the opportunity to participate in the care of this patient   Visit  Diagnosis 1. Iron deficiency anemia, unspecified iron deficiency anemia type   2. Normocytic anemia     Dr. Randa Evens, MD, MPH [facility] at [facility] [id number]  03/22/2019  10:50 AM

## 2019-03-24 ENCOUNTER — Other Ambulatory Visit: Payer: Self-pay

## 2019-03-25 ENCOUNTER — Inpatient Hospital Stay: Payer: Medicare Other

## 2019-03-25 ENCOUNTER — Other Ambulatory Visit: Payer: Self-pay

## 2019-03-25 VITALS — BP 128/75 | HR 67 | Temp 96.5°F | Resp 18

## 2019-03-25 DIAGNOSIS — D509 Iron deficiency anemia, unspecified: Secondary | ICD-10-CM | POA: Diagnosis not present

## 2019-03-25 MED ORDER — SODIUM CHLORIDE 0.9 % IV SOLN
Freq: Once | INTRAVENOUS | Status: AC
  Administered 2019-03-25: 14:00:00 via INTRAVENOUS
  Filled 2019-03-25: qty 250

## 2019-03-25 MED ORDER — IRON SUCROSE 20 MG/ML IV SOLN
200.0000 mg | Freq: Once | INTRAVENOUS | Status: AC
  Administered 2019-03-25: 200 mg via INTRAVENOUS
  Filled 2019-03-25: qty 10

## 2019-03-25 MED ORDER — SODIUM CHLORIDE 0.9 % IV SOLN: 200.0000 mg | Freq: Once | INTRAVENOUS | Status: DC

## 2019-03-25 MED ORDER — CYANOCOBALAMIN 1000 MCG/ML IJ SOLN
1000.0000 ug | INTRAMUSCULAR | Status: DC
  Administered 2019-03-25: 14:00:00 1000 ug via INTRAMUSCULAR
  Filled 2019-03-25: qty 1

## 2019-03-26 ENCOUNTER — Other Ambulatory Visit: Payer: Self-pay

## 2019-03-29 ENCOUNTER — Inpatient Hospital Stay: Payer: Medicare Other

## 2019-03-29 ENCOUNTER — Other Ambulatory Visit: Payer: Self-pay

## 2019-03-29 VITALS — BP 143/80 | HR 74 | Temp 97.9°F | Resp 18

## 2019-03-29 DIAGNOSIS — D509 Iron deficiency anemia, unspecified: Secondary | ICD-10-CM | POA: Diagnosis not present

## 2019-03-29 MED ORDER — IRON SUCROSE 20 MG/ML IV SOLN
200.0000 mg | Freq: Once | INTRAVENOUS | Status: AC
  Administered 2019-03-29: 200 mg via INTRAVENOUS
  Filled 2019-03-29: qty 10

## 2019-03-29 MED ORDER — SODIUM CHLORIDE 0.9 % IV SOLN
Freq: Once | INTRAVENOUS | Status: AC
  Administered 2019-03-29: 14:00:00 via INTRAVENOUS
  Filled 2019-03-29: qty 250

## 2019-03-29 MED ORDER — CYANOCOBALAMIN 1000 MCG/ML IJ SOLN
1000.0000 ug | INTRAMUSCULAR | Status: DC
  Administered 2019-03-29: 1000 ug via INTRAMUSCULAR
  Filled 2019-03-29: qty 1

## 2019-03-29 MED ORDER — SODIUM CHLORIDE 0.9 % IV SOLN: 200.0000 mg | Freq: Once | INTRAVENOUS | Status: DC

## 2019-03-31 ENCOUNTER — Other Ambulatory Visit: Payer: Self-pay

## 2019-04-01 ENCOUNTER — Inpatient Hospital Stay: Payer: Medicare Other | Attending: Oncology

## 2019-04-01 ENCOUNTER — Other Ambulatory Visit: Payer: Self-pay

## 2019-04-01 VITALS — BP 120/65 | HR 69 | Temp 97.4°F | Resp 18

## 2019-04-01 DIAGNOSIS — R5383 Other fatigue: Secondary | ICD-10-CM | POA: Insufficient documentation

## 2019-04-01 DIAGNOSIS — Z800 Family history of malignant neoplasm of digestive organs: Secondary | ICD-10-CM | POA: Insufficient documentation

## 2019-04-01 DIAGNOSIS — D509 Iron deficiency anemia, unspecified: Secondary | ICD-10-CM | POA: Insufficient documentation

## 2019-04-01 DIAGNOSIS — Z79899 Other long term (current) drug therapy: Secondary | ICD-10-CM | POA: Diagnosis not present

## 2019-04-01 DIAGNOSIS — Z7289 Other problems related to lifestyle: Secondary | ICD-10-CM | POA: Insufficient documentation

## 2019-04-01 DIAGNOSIS — Z820 Family history of epilepsy and other diseases of the nervous system: Secondary | ICD-10-CM | POA: Insufficient documentation

## 2019-04-01 DIAGNOSIS — Z833 Family history of diabetes mellitus: Secondary | ICD-10-CM | POA: Insufficient documentation

## 2019-04-01 MED ORDER — IRON SUCROSE 20 MG/ML IV SOLN
200.0000 mg | Freq: Once | INTRAVENOUS | Status: AC
  Administered 2019-04-01: 200 mg via INTRAVENOUS
  Filled 2019-04-01: qty 10

## 2019-04-01 MED ORDER — CYANOCOBALAMIN 1000 MCG/ML IJ SOLN
1000.0000 ug | INTRAMUSCULAR | Status: DC
  Administered 2019-04-01: 1000 ug via INTRAMUSCULAR
  Filled 2019-04-01: qty 1

## 2019-04-01 MED ORDER — SODIUM CHLORIDE 0.9 % IV SOLN: 200.0000 mg | Freq: Once | INTRAVENOUS | Status: DC

## 2019-04-01 MED ORDER — SODIUM CHLORIDE 0.9 % IV SOLN
Freq: Once | INTRAVENOUS | Status: AC
  Administered 2019-04-01: 14:00:00 via INTRAVENOUS
  Filled 2019-04-01: qty 250

## 2019-04-01 NOTE — Progress Notes (Signed)
Pt tolerated infusion well. Pt and VS stable at discharge.  

## 2019-04-02 LAB — SURGICAL PATHOLOGY

## 2019-04-05 ENCOUNTER — Inpatient Hospital Stay: Payer: Medicare Other

## 2019-04-05 ENCOUNTER — Other Ambulatory Visit: Payer: Self-pay | Admitting: Oncology

## 2019-04-05 ENCOUNTER — Other Ambulatory Visit: Payer: Self-pay

## 2019-04-05 VITALS — BP 149/88 | HR 73 | Temp 98.0°F | Resp 18

## 2019-04-05 DIAGNOSIS — D509 Iron deficiency anemia, unspecified: Secondary | ICD-10-CM

## 2019-04-05 MED ORDER — CYANOCOBALAMIN 1000 MCG/ML IJ SOLN
1000.0000 ug | INTRAMUSCULAR | Status: DC
  Administered 2019-04-05: 14:00:00 1000 ug via INTRAMUSCULAR
  Filled 2019-04-05: qty 1

## 2019-04-05 MED ORDER — SODIUM CHLORIDE 0.9 % IV SOLN: 200.0000 mg | Freq: Once | INTRAVENOUS | Status: DC

## 2019-04-05 MED ORDER — IRON SUCROSE 20 MG/ML IV SOLN
200.0000 mg | Freq: Once | INTRAVENOUS | Status: AC
  Administered 2019-04-05: 200 mg via INTRAVENOUS
  Filled 2019-04-05: qty 10

## 2019-04-05 MED ORDER — SODIUM CHLORIDE 0.9 % IV SOLN
Freq: Once | INTRAVENOUS | Status: AC
  Administered 2019-04-05: 14:00:00 via INTRAVENOUS
  Filled 2019-04-05: qty 250

## 2019-04-12 DIAGNOSIS — M791 Myalgia, unspecified site: Secondary | ICD-10-CM | POA: Insufficient documentation

## 2019-04-12 DIAGNOSIS — T466X5A Adverse effect of antihyperlipidemic and antiarteriosclerotic drugs, initial encounter: Secondary | ICD-10-CM | POA: Insufficient documentation

## 2019-04-23 ENCOUNTER — Telehealth: Payer: Self-pay | Admitting: *Deleted

## 2019-04-23 NOTE — Telephone Encounter (Signed)
Yes he can

## 2019-04-23 NOTE — Telephone Encounter (Signed)
Patient called reporting that he is s/p 5 iron infusions and 5 doses of B 12 and that he feels much better except that he is fatigued. He is asking if he can take an oral B 12 and iron supplement.Please advise

## 2019-04-23 NOTE — Telephone Encounter (Signed)
Call returned to patient and advised that per Dr Janese Banks ok to take over the counter b 12 and iron. He stated he will get some

## 2019-05-24 ENCOUNTER — Other Ambulatory Visit: Payer: Self-pay

## 2019-05-24 NOTE — Progress Notes (Signed)
Patient pre screened for office appointment, no questions or concerns today. Patient reminded of upcoming appointment time and date. Patient reports that he is now taking liguid iron daily.

## 2019-05-25 ENCOUNTER — Inpatient Hospital Stay: Payer: Medicare Other

## 2019-05-25 ENCOUNTER — Inpatient Hospital Stay: Payer: Medicare Other | Attending: Internal Medicine

## 2019-05-25 ENCOUNTER — Inpatient Hospital Stay (HOSPITAL_BASED_OUTPATIENT_CLINIC_OR_DEPARTMENT_OTHER): Payer: Medicare Other | Admitting: Oncology

## 2019-05-25 ENCOUNTER — Other Ambulatory Visit: Payer: Self-pay

## 2019-05-25 VITALS — BP 142/89 | HR 92 | Temp 98.4°F | Resp 18 | Wt 289.2 lb

## 2019-05-25 DIAGNOSIS — Z833 Family history of diabetes mellitus: Secondary | ICD-10-CM | POA: Insufficient documentation

## 2019-05-25 DIAGNOSIS — E538 Deficiency of other specified B group vitamins: Secondary | ICD-10-CM | POA: Insufficient documentation

## 2019-05-25 DIAGNOSIS — Z79899 Other long term (current) drug therapy: Secondary | ICD-10-CM | POA: Insufficient documentation

## 2019-05-25 DIAGNOSIS — M1990 Unspecified osteoarthritis, unspecified site: Secondary | ICD-10-CM | POA: Insufficient documentation

## 2019-05-25 DIAGNOSIS — Z800 Family history of malignant neoplasm of digestive organs: Secondary | ICD-10-CM | POA: Insufficient documentation

## 2019-05-25 DIAGNOSIS — Z818 Family history of other mental and behavioral disorders: Secondary | ICD-10-CM | POA: Insufficient documentation

## 2019-05-25 DIAGNOSIS — Z8719 Personal history of other diseases of the digestive system: Secondary | ICD-10-CM | POA: Insufficient documentation

## 2019-05-25 DIAGNOSIS — F419 Anxiety disorder, unspecified: Secondary | ICD-10-CM | POA: Diagnosis not present

## 2019-05-25 DIAGNOSIS — D509 Iron deficiency anemia, unspecified: Secondary | ICD-10-CM | POA: Insufficient documentation

## 2019-05-25 LAB — CBC WITH DIFFERENTIAL/PLATELET
Abs Immature Granulocytes: 0.05 10*3/uL (ref 0.00–0.07)
Basophils Absolute: 0.1 10*3/uL (ref 0.0–0.1)
Basophils Relative: 1 %
Eosinophils Absolute: 0.2 10*3/uL (ref 0.0–0.5)
Eosinophils Relative: 3 %
HCT: 37.8 % — ABNORMAL LOW (ref 39.0–52.0)
Hemoglobin: 12.6 g/dL — ABNORMAL LOW (ref 13.0–17.0)
Immature Granulocytes: 1 %
Lymphocytes Relative: 20 %
Lymphs Abs: 1.6 10*3/uL (ref 0.7–4.0)
MCH: 31.5 pg (ref 26.0–34.0)
MCHC: 33.3 g/dL (ref 30.0–36.0)
MCV: 94.5 fL (ref 80.0–100.0)
Monocytes Absolute: 0.7 10*3/uL (ref 0.1–1.0)
Monocytes Relative: 8 %
Neutro Abs: 5.4 10*3/uL (ref 1.7–7.7)
Neutrophils Relative %: 67 %
Platelets: 345 10*3/uL (ref 150–400)
RBC: 4 MIL/uL — ABNORMAL LOW (ref 4.22–5.81)
RDW: 14.5 % (ref 11.5–15.5)
WBC: 8 10*3/uL (ref 4.0–10.5)
nRBC: 0 % (ref 0.0–0.2)

## 2019-05-25 LAB — IRON AND TIBC
Iron: 95 ug/dL (ref 45–182)
Saturation Ratios: 30 % (ref 17.9–39.5)
TIBC: 318 ug/dL (ref 250–450)
UIBC: 223 ug/dL

## 2019-05-25 LAB — FERRITIN: Ferritin: 77 ng/mL (ref 24–336)

## 2019-05-25 MED ORDER — CYANOCOBALAMIN 1000 MCG/ML IJ SOLN
1000.0000 ug | INTRAMUSCULAR | Status: DC
  Administered 2019-05-25: 1000 ug via INTRAMUSCULAR
  Filled 2019-05-25: qty 1

## 2019-05-31 ENCOUNTER — Encounter: Payer: Self-pay | Admitting: Oncology

## 2019-05-31 NOTE — Progress Notes (Signed)
Hematology/Oncology Consult note [facility]  Telephone:([phone] [phone]  Patient Care Team: Unice Bailey, MD as PCP - General (Rheumatology) Marry Guan Laurice Record, MD (Orthopedic Surgery) Nobie Putnam, MD (Hematology and Oncology)   Name of the patient: [name] [name]  [id number]  1946-05-17   Date of visit: 05/31/19  Diagnosis-iron deficiency anemia  Chief complaint/ Reason for visit-routine follow-up of iron deficiency anemia  Heme/Onc history: Patient is a 73 year old male who was seen by Dr. Alice Reichert for iron deficiency anemia.  Most recent CBC from 02/08/2019 showed white count of 8.9, H&H of 10.9/33.5 and a platelet count of 378.  Iron studies from 03/02/2019 showed a ferritin of 11 and increased TIBC of 455.  CMP was normal except for mildly low sodium of 135.  Patient has previously undergone colonoscopy with lumbar GI.  He underwent EGD with Dr. Alice Reichert which did not show any evidence of active bleeding and he will be going through a capsule endoscopy soon.  Biopsies were negative for celiac disease.  He has been referred to [place] for consideration for IV iron infusion.   Interval history-overall patient is doing well denies any complaints at this time  ECOG PS- 1 Pain scale- 0 Opioid associated constipation- no  Review of systems- Review of Systems  Constitutional: Negative for chills, fever, malaise/fatigue and weight loss.  HENT: Negative for congestion, ear discharge and nosebleeds.   Eyes: Negative for blurred vision.  Respiratory: Negative for cough, hemoptysis, sputum production, shortness of breath and wheezing.   Cardiovascular: Negative for chest pain, palpitations, orthopnea and claudication.  Gastrointestinal: Negative for abdominal pain, blood in stool, constipation, diarrhea, heartburn, melena, nausea and vomiting.  Genitourinary: Negative for dysuria, flank pain, frequency, hematuria and urgency.  Musculoskeletal: Negative for  back pain, joint pain and myalgias.  Skin: Negative for rash.  Neurological: Negative for dizziness, tingling, focal weakness, seizures, weakness and headaches.  Endo/Heme/Allergies: Does not bruise/bleed easily.  Psychiatric/Behavioral: Negative for depression and suicidal ideas. The patient does not have insomnia.       No Known Allergies   Past Medical History:  Diagnosis Date  . Anxiety   . Arthritis   . Diabetes mellitus   . Diverticulosis   . Hemorrhoids   . HOH (hard of hearing)    HOH  . Hypertension   . Normocytic anemia   . Prostate disorder   . Sleep apnea    no cpap     Past Surgical History:  Procedure Laterality Date  . CATARACT EXTRACTION W/PHACO Left 07/14/2018   Procedure: CATARACT EXTRACTION PHACO AND INTRAOCULAR LENS PLACEMENT (IOC) LEFT, DIABETIC;  Surgeon: Birder Robson, MD;  Location: [facility] ORS;  Service: Ophthalmology;  Laterality: Left;  [place] 00:50 CDE 8.93 Fluid pack lot # [id number]  . CATARACT EXTRACTION W/PHACO Right 09/01/2018   Procedure: CATARACT EXTRACTION PHACO AND INTRAOCULAR LENS PLACEMENT (IOC)-RIGHT, DIABETIC;  Surgeon: Birder Robson, MD;  Location: [facility] ORS;  Service: Ophthalmology;  Laterality: Right;  [place] 00:50.9 CDE 6.80 Fluid Pack Lot # [id number]    . COLONOSCOPY    . CYSTOSCOPY WITH INSERTION OF UROLIFT    . CYSTOSCOPY WITH INSERTION OF UROLIFT  08/2018  . ESOPHAGOGASTRODUODENOSCOPY (EGD) WITH PROPOFOL N/A 03/17/2019   Procedure: ESOPHAGOGASTRODUODENOSCOPY (EGD) WITH PROPOFOL;  Surgeon: Toledo, Benay Pike, MD;  Location: [facility] ENDOSCOPY;  Service: Gastroenterology;  Laterality: N/A;  . 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  . REPLACEMENT TOTAL KNEE Bilateral     Social History  Socioeconomic History  . Marital status: Married    Spouse name: Bonnita Nasuti  . Number of children: 3  . Years of education: Not on file  . Highest education level: Not on file  Occupational History  . Not on file  Social Needs   . Financial resource strain: Not on file  . Food insecurity    Worry: Not on file    Inability: Not on file  . Transportation needs    Medical: Not on file    Non-medical: Not on file  Tobacco Use  . Smoking status: Never Smoker  . Smokeless tobacco: Never Used  Substance and Sexual Activity  . Alcohol use: Yes    Alcohol/week: 0.0 standard drinks    Comment: 3x week vodka  . Drug use: Never  . Sexual activity: Not on file  Lifestyle  . Physical activity    Days per week: Not on file    Minutes per session: Not on file  . Stress: Not on file  Relationships  . Social Herbalist on phone: Not on file    Gets together: Not on file    Attends religious service: Not on file    Active member of club or organization: Not on file    Attends meetings of clubs or organizations: Not on file    Relationship status: Not on file  . Intimate partner violence    Fear of current or ex partner: Not on file    Emotionally abused: Not on file    Physically abused: Not on file    Forced sexual activity: Not on file  Other Topics Concern  . Not on file  Social History Narrative  . Not on file    Family History  Problem Relation Age of Onset  . Diabetes Mother   . Alzheimer's disease Mother   . Colon cancer Sister      Current Outpatient Medications:  .  acetaminophen (TYLENOL) 500 MG tablet, Take 1,000 mg by mouth every 6 (six) hours as needed for moderate pain. , Disp: , Rfl:  .  ALPRAZolam (XANAX) 1 MG tablet, Take 1 tablet (1 mg total) by mouth daily as needed. (Patient taking differently: Take 1 mg by mouth at bedtime as needed for sleep. ), Disp: 30 tablet, Rfl: 0 .  amLODipine (NORVASC) 10 MG tablet, Take 1 tablet (10 mg total) by mouth daily., Disp: 90 tablet, Rfl: 0 .  aspirin EC 81 MG tablet, Take 81 mg by mouth daily., Disp: , Rfl:  .  atorvastatin (LIPITOR) 20 MG tablet, Take 20 mg by mouth daily., Disp: , Rfl:  .  benazepril (LOTENSIN) 40 MG tablet, Take 40 mg  by mouth daily., Disp: , Rfl:  .  ferrous sulfate (FER-IN-SOL) 75 (15 Fe) MG/ML SOLN, Take 75 mg of iron by mouth daily., Disp: , Rfl:  .  glimepiride (AMARYL) 2 MG tablet, Take 2 mg by mouth daily with breakfast., Disp: , Rfl:  .  ibuprofen (ADVIL,MOTRIN) 200 MG tablet, Take 400 mg by mouth every 6 (six) hours as needed for moderate pain. , Disp: , Rfl:  .  metFORMIN (GLUCOPHAGE-XR) 500 MG 24 hr tablet, Take 1,000 mg by mouth every evening., Disp: , Rfl:  .  sertraline (ZOLOFT) 50 MG tablet, Take 50 mg by mouth daily., Disp: , Rfl:  .  terazosin (HYTRIN) 5 MG capsule, Take 5 mg by mouth every evening., Disp: , Rfl:   Physical exam:  Vitals:   05/25/19 [id number]  BP: (!) 142/89  Pulse: 92  Resp: 18  Temp: 98.4 F (36.9 C)  TempSrc: Tympanic  SpO2: 98%  Weight: 289 lb 3.2 oz (131.2 kg)   Physical Exam Constitutional:      General: He is not in acute distress. HENT:     Head: Normocephalic and atraumatic.  Eyes:     Pupils: Pupils are equal, round, and reactive to light.  Neck:     Musculoskeletal: Normal range of motion.  Cardiovascular:     Rate and Rhythm: Normal rate and regular rhythm.     Heart sounds: Normal heart sounds.  Pulmonary:     Effort: Pulmonary effort is normal.     Breath sounds: Normal breath sounds.  Abdominal:     General: Bowel sounds are normal.     Palpations: Abdomen is soft.  Skin:    General: Skin is warm and dry.  Neurological:     Mental Status: He is alert and oriented to person, place, and time.      CMP Latest Ref Rng & Units 12/20/2014  Glucose 65 - 99 mg/dL 132(H)  BUN 8 - 27 mg/dL 9  Creatinine 0.76 - 1.27 mg/dL 0.58(L)  Sodium 134 - 144 mmol/L 134  Potassium 3.5 - 5.2 mmol/L 4.3  Chloride 97 - 108 mmol/L 94(L)  CO2 18 - 29 mmol/L 26  Calcium 8.6 - 10.2 mg/dL 9.1  Total Protein 6.0 - 8.5 g/dL 6.5  Total Bilirubin 0.0 - 1.2 mg/dL 0.3  Alkaline Phos 39 - 117 IU/L 80  AST 0 - 40 IU/L 14  ALT 0 - 44 IU/L 15   CBC Latest Ref Rng &  Units 05/25/2019  WBC 4.0 - 10.5 K/uL 8.0  Hemoglobin 13.0 - 17.0 g/dL 12.6(L)  Hematocrit 39.0 - 52.0 % 37.8(L)  Platelets 150 - 400 K/uL 345      Assessment and plan- Patient is a 73 y.o. male with iron deficiency anemia here for routine follow-up  After receiving IV iron patient's hemoglobin has improved to 12.6.  Patient was also found to have a low B12 level of 159 and he will be receiving his B12 shot today following which he will be going on oral B12 supplementation 1000 mcg p.o. daily.  I will repeat CBC ferritin and iron studies as well as B12 levels in 3 months and see him back in 6 months   Visit Diagnosis 1. Iron deficiency anemia, unspecified iron deficiency anemia type   2. B12 deficiency      Dr. Randa Evens, MD, MPH [facility] at [facility] [id number] 05/31/2019 8:41 AM

## 2019-06-23 ENCOUNTER — Ambulatory Visit: Payer: Medicare Other | Attending: Internal Medicine

## 2019-06-23 DIAGNOSIS — Z20822 Contact with and (suspected) exposure to covid-19: Secondary | ICD-10-CM

## 2019-06-24 LAB — NOVEL CORONAVIRUS, NAA: SARS-CoV-2, NAA: NOT DETECTED

## 2019-07-09 ENCOUNTER — Telehealth: Payer: Self-pay | Admitting: Neurology

## 2019-07-24 ENCOUNTER — Ambulatory Visit: Payer: Medicare Other | Attending: Internal Medicine

## 2019-07-24 DIAGNOSIS — Z23 Encounter for immunization: Secondary | ICD-10-CM

## 2019-07-24 NOTE — Progress Notes (Signed)
   Covid-19 Vaccination Clinic  Name:  [name] [name]    MRN: [id number] DOB: 17-Aug-1945  07/24/2019  [name] was observed post Covid-19 immunization for 15 minutes without incidence. He was provided with Vaccine Information Sheet and instruction to access the V-Safe system.   [name] was instructed to call 911 with any severe reactions post vaccine: Marland Kitchen Difficulty breathing  . Swelling of your face and throat  . A fast heartbeat  . A bad rash all over your body  . Dizziness and weakness    Immunizations Administered    Name Date Dose VIS Date Route   Pfizer COVID-19 Vaccine 07/24/2019  2:25 PM 0.3 mL 06/11/2019 Intramuscular   Manufacturer: [place]   Lot: [id number]   [place]: [id number]

## 2019-08-14 ENCOUNTER — Ambulatory Visit: Payer: Medicare Other | Attending: Internal Medicine

## 2019-08-14 DIAGNOSIS — Z23 Encounter for immunization: Secondary | ICD-10-CM | POA: Insufficient documentation

## 2019-08-14 NOTE — Progress Notes (Signed)
   Covid-19 Vaccination Clinic  Name:  [name]    MRN: [id number] DOB: 03-Aug-1945  08/14/2019  Mr. Uram was observed post Covid-19 immunization for 15 minutes without incidence. He was provided with Vaccine Information Sheet and instruction to access the V-Safe system.   Mr. Karsten was instructed to call 911 with any severe reactions post vaccine: Marland Kitchen Difficulty breathing  . Swelling of your face and throat  . A fast heartbeat  . A bad rash all over your body  . Dizziness and weakness    Immunizations Administered    Name Date Dose VIS Date Route   Pfizer COVID-19 Vaccine 08/14/2019  1:17 PM 0.3 mL 06/11/2019 Intramuscular   Manufacturer: [place]   Lot: [id number]   [place]: [id number]

## 2019-08-24 ENCOUNTER — Other Ambulatory Visit: Payer: Self-pay

## 2019-08-25 ENCOUNTER — Inpatient Hospital Stay: Payer: Medicare Other | Attending: Oncology

## 2019-08-25 ENCOUNTER — Other Ambulatory Visit: Payer: Self-pay

## 2019-08-25 DIAGNOSIS — D509 Iron deficiency anemia, unspecified: Secondary | ICD-10-CM

## 2019-08-25 DIAGNOSIS — E538 Deficiency of other specified B group vitamins: Secondary | ICD-10-CM | POA: Insufficient documentation

## 2019-08-25 LAB — CBC WITH DIFFERENTIAL/PLATELET
Abs Immature Granulocytes: 0.03 10*3/uL (ref 0.00–0.07)
Basophils Absolute: 0.1 10*3/uL (ref 0.0–0.1)
Basophils Relative: 1 %
Eosinophils Absolute: 0.2 10*3/uL (ref 0.0–0.5)
Eosinophils Relative: 2 %
HCT: 40.4 % (ref 39.0–52.0)
Hemoglobin: 13.6 g/dL (ref 13.0–17.0)
Immature Granulocytes: 0 %
Lymphocytes Relative: 23 %
Lymphs Abs: 2.1 10*3/uL (ref 0.7–4.0)
MCH: 32 pg (ref 26.0–34.0)
MCHC: 33.7 g/dL (ref 30.0–36.0)
MCV: 95.1 fL (ref 80.0–100.0)
Monocytes Absolute: 0.8 10*3/uL (ref 0.1–1.0)
Monocytes Relative: 9 %
Neutro Abs: 6.1 10*3/uL (ref 1.7–7.7)
Neutrophils Relative %: 65 %
Platelets: 347 10*3/uL (ref 150–400)
RBC: 4.25 MIL/uL (ref 4.22–5.81)
RDW: 11.9 % (ref 11.5–15.5)
WBC: 9.4 10*3/uL (ref 4.0–10.5)
nRBC: 0 % (ref 0.0–0.2)

## 2019-08-25 LAB — IRON AND TIBC
Iron: 106 ug/dL (ref 45–182)
Saturation Ratios: 32 % (ref 17.9–39.5)
TIBC: 328 ug/dL (ref 250–450)
UIBC: 222 ug/dL

## 2019-08-25 LAB — FERRITIN: Ferritin: 99 ng/mL (ref 24–336)

## 2019-08-25 LAB — VITAMIN B12: Vitamin B-12: 2177 pg/mL — ABNORMAL HIGH (ref 180–914)

## 2019-08-27 ENCOUNTER — Encounter: Payer: Self-pay | Admitting: Oncology

## 2019-09-26 DIAGNOSIS — I5189 Other ill-defined heart diseases: Secondary | ICD-10-CM

## 2019-09-26 HISTORY — DX: Other ill-defined heart diseases: I51.89

## 2019-10-05 NOTE — Telephone Encounter (Signed)
Close encounter 

## 2019-10-17 DIAGNOSIS — Z96653 Presence of artificial knee joint, bilateral: Secondary | ICD-10-CM | POA: Insufficient documentation

## 2019-11-21 NOTE — Discharge Instructions (Signed)
Instructions after Total Hip Replacement     Johnay Mano P. Lynkin Saini, Jr., M.D.     Dept. of Orthopaedics & Sports Medicine  [facility]  [address]  [place], [place]  [address]  Phone: [phone]   Fax: [phone]    DIET: . Drink plenty of non-alcoholic fluids. . Resume your normal diet. Include foods high in fiber.  ACTIVITY:  . You may use crutches or a walker with weight-bearing as tolerated, unless instructed otherwise. . You may be weaned off of the walker or crutches by your Physical Therapist.  . Do NOT reach below the level of your knees or cross your legs until allowed.    . Continue doing gentle exercises. Exercising will reduce the pain and swelling, increase motion, and prevent muscle weakness.   . Please continue to use the TED compression stockings for 6 weeks. You may remove the stockings at night, but should reapply them in the morning. . Do not drive or operate any equipment until instructed.  WOUND CARE:  . Continue to use ice packs periodically to reduce pain and swelling. . Keep the incision clean and dry. . You may bathe or shower after the staples are removed at the first office visit following surgery.  MEDICATIONS: . You may resume your regular medications. . Please take the pain medication as prescribed on the medication. . Do not take pain medication on an empty stomach. . You have been given a prescription for a blood thinner to prevent blood clots. Please take the medication as instructed. (NOTE: After completing a 2 week course of Lovenox, take one Enteric-coated aspirin once a day.) . Pain medications and iron supplements can cause constipation. Use a stool softener (Senokot or Colace) on a daily basis and a laxative (dulcolax or miralax) as needed. . Do not drive or drink alcoholic beverages when taking pain medications.  CALL THE OFFICE FOR: . Temperature above 101 degrees . Excessive bleeding or drainage on the dressing. . Excessive  swelling, coldness, or paleness of the toes. . Persistent nausea and vomiting.  FOLLOW-UP:  . You should have an appointment to return to the office in 6 weeks after surgery. . Arrangements have been made for continuation of Physical Therapy (either home therapy or outpatient therapy).     [facility] Department Directory         [email or web address]       [email or web address]          Cardiology  Appointments: [place] - [phone] [place] - [phone]  Endocrinology  Appointments: [place] - [phone] [place] - [phone]  Gastroenterology  Appointments: [place] - [phone] [place] - [phone]        General Surgery   Appointments: [place] - [phone]  Internal Medicine/Family Medicine  Appointments: [place] - [phone] [place] - [phone] [place] - [phone]  Metabolic and Weigh Loss Surgery  Appointments: [place] - [phone]        Neurology  Appointments: [place] - [phone] [place] - [phone]  Neurosurgery  Appointments: [place] - [phone]  Obstetrics & Gynecology  Appointments: [place] - [phone] [place] - [phone]        Pediatrics  Appointments: [place] - [phone] [place] - [phone]  Physiatry  Appointments: [place] -[phone]  Physical Therapy  Appointments: [place] - [phone] [place] - [phone]        Podiatry  Appointments: [place] - [phone] [place] - [phone]  Pulmonology  Appointments: [place] - [phone]  Rheumatology  Appointments: [place] - [phone]         Location: [facility]  [address] [place], [place]  [address]  [place] Location: [facility] [address] [place], [place]  [address]  [place] Location: [facility] [address] [place], [place]  [address]    

## 2019-11-22 ENCOUNTER — Other Ambulatory Visit: Payer: Self-pay

## 2019-11-22 ENCOUNTER — Inpatient Hospital Stay: Payer: Medicare Other | Attending: Oncology

## 2019-11-22 ENCOUNTER — Inpatient Hospital Stay: Payer: Medicare Other | Admitting: Oncology

## 2019-11-22 ENCOUNTER — Encounter: Payer: Self-pay | Admitting: Oncology

## 2019-11-22 VITALS — BP 157/89 | HR 83 | Temp 98.4°F | Resp 16 | Wt 288.0 lb

## 2019-11-22 DIAGNOSIS — Z818 Family history of other mental and behavioral disorders: Secondary | ICD-10-CM | POA: Insufficient documentation

## 2019-11-22 DIAGNOSIS — Z79899 Other long term (current) drug therapy: Secondary | ICD-10-CM | POA: Insufficient documentation

## 2019-11-22 DIAGNOSIS — Z833 Family history of diabetes mellitus: Secondary | ICD-10-CM | POA: Insufficient documentation

## 2019-11-22 DIAGNOSIS — R5383 Other fatigue: Secondary | ICD-10-CM | POA: Insufficient documentation

## 2019-11-22 DIAGNOSIS — Z8719 Personal history of other diseases of the digestive system: Secondary | ICD-10-CM | POA: Insufficient documentation

## 2019-11-22 DIAGNOSIS — D509 Iron deficiency anemia, unspecified: Secondary | ICD-10-CM | POA: Insufficient documentation

## 2019-11-22 DIAGNOSIS — Z800 Family history of malignant neoplasm of digestive organs: Secondary | ICD-10-CM | POA: Diagnosis not present

## 2019-11-22 DIAGNOSIS — E538 Deficiency of other specified B group vitamins: Secondary | ICD-10-CM

## 2019-11-22 DIAGNOSIS — M25551 Pain in right hip: Secondary | ICD-10-CM | POA: Insufficient documentation

## 2019-11-22 LAB — CBC WITH DIFFERENTIAL/PLATELET
Abs Immature Granulocytes: 0.08 10*3/uL — ABNORMAL HIGH (ref 0.00–0.07)
Basophils Absolute: 0.1 10*3/uL (ref 0.0–0.1)
Basophils Relative: 1 %
Eosinophils Absolute: 0.3 10*3/uL (ref 0.0–0.5)
Eosinophils Relative: 2 %
HCT: 37.5 % — ABNORMAL LOW (ref 39.0–52.0)
Hemoglobin: 13 g/dL (ref 13.0–17.0)
Immature Granulocytes: 1 %
Lymphocytes Relative: 16 %
Lymphs Abs: 2 10*3/uL (ref 0.7–4.0)
MCH: 32.5 pg (ref 26.0–34.0)
MCHC: 34.7 g/dL (ref 30.0–36.0)
MCV: 93.8 fL (ref 80.0–100.0)
Monocytes Absolute: 1 10*3/uL (ref 0.1–1.0)
Monocytes Relative: 9 %
Neutro Abs: 8.8 10*3/uL — ABNORMAL HIGH (ref 1.7–7.7)
Neutrophils Relative %: 71 %
Platelets: 373 10*3/uL (ref 150–400)
RBC: 4 MIL/uL — ABNORMAL LOW (ref 4.22–5.81)
RDW: 12.1 % (ref 11.5–15.5)
WBC: 12.2 10*3/uL — ABNORMAL HIGH (ref 4.0–10.5)
nRBC: 0 % (ref 0.0–0.2)

## 2019-11-22 LAB — IRON AND TIBC
Iron: 80 ug/dL (ref 45–182)
Saturation Ratios: 25 % (ref 17.9–39.5)
TIBC: 326 ug/dL (ref 250–450)
UIBC: 246 ug/dL

## 2019-11-22 LAB — FERRITIN: Ferritin: 125 ng/mL (ref 24–336)

## 2019-11-22 NOTE — Progress Notes (Signed)
Pt tired but thinks it is because of hip pain on right- he is going to get surgery 6/13

## 2019-11-25 NOTE — Progress Notes (Signed)
Hematology/Oncology Consult note [facility]  [phone] [phone]  Patient Care Team: Unice Bailey, MD as PCP - General (Rheumatology) Marry Guan Laurice Record, MD (Orthopedic Surgery) Nobie Putnam, MD (Hematology and Oncology)   Name of the patient: [name] [name]  [id number]  06-Feb-1946   Date of visit: 11/25/19  Diagnosis-iron deficiency anemia  Chief complaint/ Reason for visit-routine follow-up of iron deficiency  Heme/Onc history: Patient is a 74 year old male who was seen by Dr. Alice Reichert for iron deficiency anemia. Most recent CBC from 02/08/2019 showed white count of 8.9, H&H of 10.9/33.5 and a platelet count of 378. Iron studies from 03/02/2019 showed a ferritin of 11 and increased TIBC of 455. CMP was normal except for mildly low sodium of 135. Patient has previously undergone colonoscopy with GI. He underwent EGD with Dr. Alice Reichert which did not show any evidence of active bleeding and he will be going through a capsule endoscopy soon. Biopsies were negative for celiac disease.He has been referred to [place] for consideration for IV iron infusion.   Interval history-patient complains of pain in his right hip which is limiting his mobility.  He is due to undergo surgery on 6/13.  Reports ongoing fatigue.  ECOG PS- 1 Pain scale- 5  Review of systems- Review of Systems  Constitutional: Negative for chills, fever, malaise/fatigue and weight loss.  HENT: Negative for congestion, ear discharge and nosebleeds.   Eyes: Negative for blurred vision.  Respiratory: Negative for cough, hemoptysis, sputum production, shortness of breath and wheezing.   Cardiovascular: Negative for chest pain, palpitations, orthopnea and claudication.  Gastrointestinal: Negative for abdominal pain, blood in stool, constipation, diarrhea, heartburn, melena, nausea and vomiting.  Genitourinary: Negative for dysuria, flank pain, frequency, hematuria and urgency.   Musculoskeletal: Positive for joint pain (Right hip pain). Negative for back pain and myalgias.  Skin: Negative for rash.  Neurological: Negative for dizziness, tingling, focal weakness, seizures, weakness and headaches.  Endo/Heme/Allergies: Does not bruise/bleed easily.  Psychiatric/Behavioral: Negative for depression and suicidal ideas. The patient does not have insomnia.       No Known Allergies   Past Medical History:  Diagnosis Date  . Anxiety   . Arthritis   . Diabetes mellitus   . Diverticulosis   . Hemorrhoids   . HOH (hard of hearing)    HOH  . Hypertension   . Normocytic anemia   . Prostate disorder   . Sleep apnea    no cpap     Past Surgical History:  Procedure Laterality Date  . CATARACT EXTRACTION W/PHACO Left 07/14/2018   Procedure: CATARACT EXTRACTION PHACO AND INTRAOCULAR LENS PLACEMENT (IOC) LEFT, DIABETIC;  Surgeon: Birder Robson, MD;  Location: [facility] ORS;  Service: Ophthalmology;  Laterality: Left;  [place] 00:50 CDE 8.93 Fluid pack lot # [id number]  . CATARACT EXTRACTION W/PHACO Right 09/01/2018   Procedure: CATARACT EXTRACTION PHACO AND INTRAOCULAR LENS PLACEMENT (IOC)-RIGHT, DIABETIC;  Surgeon: Birder Robson, MD;  Location: [facility] ORS;  Service: Ophthalmology;  Laterality: Right;  [place] 00:50.9 CDE 6.80 Fluid Pack Lot # [id number]    . COLONOSCOPY    . CYSTOSCOPY WITH INSERTION OF UROLIFT    . CYSTOSCOPY WITH INSERTION OF UROLIFT  08/2018  . ESOPHAGOGASTRODUODENOSCOPY (EGD) WITH PROPOFOL N/A 03/17/2019   Procedure: ESOPHAGOGASTRODUODENOSCOPY (EGD) WITH PROPOFOL;  Surgeon: Toledo, Benay Pike, MD;  Location: [facility] ENDOSCOPY;  Service: Gastroenterology;  Laterality: N/A;  . 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  . REPLACEMENT TOTAL KNEE Bilateral  Social History   Socioeconomic History  . Marital status: Married    Spouse name: Bonnita Nasuti  . Number of children: 3  . Years of education: Not on file  . Highest education level: Not on  file  Occupational History  . Not on file  Tobacco Use  . Smoking status: Never Smoker  . Smokeless tobacco: Never Used  Substance and Sexual Activity  . Alcohol use: Yes    Alcohol/week: 0.0 standard drinks    Comment: 3x week vodka  . Drug use: Never  . Sexual activity: Not on file  Other Topics Concern  . Not on file  Social History Narrative  . Not on file   Social Determinants of Health   Financial Resource Strain:   . Difficulty of Paying Living Expenses:   Food Insecurity:   . Worried About Charity fundraiser in the Last Year:   . Arboriculturist in the Last Year:   Transportation Needs:   . Film/video editor (Medical):   Marland Kitchen Lack of Transportation (Non-Medical):   Physical Activity:   . Days of Exercise per Week:   . Minutes of Exercise per Session:   Stress:   . Feeling of Stress :   Social Connections:   . Frequency of Communication with Friends and Family:   . Frequency of Social Gatherings with Friends and Family:   . Attends Religious Services:   . Active Member of Clubs or Organizations:   . Attends Archivist Meetings:   Marland Kitchen Marital Status:   Intimate Partner Violence:   . Fear of Current or Ex-Partner:   . Emotionally Abused:   Marland Kitchen Physically Abused:   . Sexually Abused:     Family History  Problem Relation Age of Onset  . Diabetes Mother   . Alzheimer's disease Mother   . Colon cancer Sister      Current Outpatient Medications:  .  acetaminophen (TYLENOL) 500 MG tablet, Take 1,000 mg by mouth every 6 (six) hours as needed for moderate pain. , Disp: , Rfl:  .  amLODipine (NORVASC) 10 MG tablet, Take 1 tablet (10 mg total) by mouth daily., Disp: 90 tablet, Rfl: 0 .  aspirin EC 81 MG tablet, Take 81 mg by mouth daily., Disp: , Rfl:  .  benazepril (LOTENSIN) 40 MG tablet, Take 40 mg by mouth daily., Disp: , Rfl:  .  glimepiride (AMARYL) 2 MG tablet, Take 2 mg by mouth daily with breakfast., Disp: , Rfl:  .  ibuprofen (ADVIL,MOTRIN)  200 MG tablet, Take 400 mg by mouth every 6 (six) hours as needed for moderate pain. , Disp: , Rfl:  .  metFORMIN (GLUCOPHAGE-XR) 500 MG 24 hr tablet, Take 1,000 mg by mouth every evening., Disp: , Rfl:  .  Semaglutide 7 MG TABS, Take 7 mg by mouth daily. , Disp: , Rfl:  .  sertraline (ZOLOFT) 50 MG tablet, Take 50 mg by mouth daily., Disp: , Rfl:  .  ALPRAZolam (XANAX) 0.5 MG tablet, Take 0.5 mg by mouth at bedtime as needed for sleep., Disp: , Rfl:  .  pravastatin (PRAVACHOL) 10 MG tablet, Take 10 mg by mouth at bedtime., Disp: , Rfl:   Physical exam:  Vitals:   11/22/19 1108  BP: (!) 157/89  Pulse: 83  Resp: 16  Temp: 98.4 F (36.9 C)  TempSrc: Oral  Weight: 288 lb (130.6 kg)   Physical Exam Constitutional:      General: He is not in  acute distress. Cardiovascular:     Rate and Rhythm: Normal rate and regular rhythm.     Heart sounds: Normal heart sounds.  Pulmonary:     Effort: Pulmonary effort is normal.     Breath sounds: Normal breath sounds.  Abdominal:     General: Bowel sounds are normal.     Palpations: Abdomen is soft.  Skin:    General: Skin is warm and dry.  Neurological:     Mental Status: He is alert and oriented to person, place, and time.      CMP Latest Ref Rng & Units 12/20/2014  Glucose 65 - 99 mg/dL 132(H)  BUN 8 - 27 mg/dL 9  Creatinine 0.76 - 1.27 mg/dL 0.58(L)  Sodium 134 - 144 mmol/L 134  Potassium 3.5 - 5.2 mmol/L 4.3  Chloride 97 - 108 mmol/L 94(L)  CO2 18 - 29 mmol/L 26  Calcium 8.6 - 10.2 mg/dL 9.1  Total Protein 6.0 - 8.5 g/dL 6.5  Total Bilirubin 0.0 - 1.2 mg/dL 0.3  Alkaline Phos 39 - 117 IU/L 80  AST 0 - 40 IU/L 14  ALT 0 - 44 IU/L 15   CBC Latest Ref Rng & Units 11/22/2019  WBC 4.0 - 10.5 K/uL 12.2(H)  Hemoglobin 13.0 - 17.0 g/dL 13.0  Hematocrit 39.0 - 52.0 % 37.5(L)  Platelets 150 - 400 K/uL 373      Assessment and plan- Patient is a 74 y.o. male with iron deficiency anemia here for routine follow-up  Patient last  required IV iron in October 2020.  Currently his hemoglobin is stable between 12-13.  Ferritin is normal at 125 and iron studies are normal.  He does not require IV iron at this time.  Repeat CBC ferritin and iron studies in 3 months in 6 months and I will see him in 6 months     Visit Diagnosis 1. Iron deficiency anemia, unspecified iron deficiency anemia type      Dr. Randa Evens, MD, MPH [facility] [id number] 11/25/2019 9:41 AM

## 2019-12-02 ENCOUNTER — Encounter
Admission: RE | Admit: 2019-12-02 | Discharge: 2019-12-02 | Disposition: A | Discharge status: Home / Self Care | Payer: Medicare Other | Source: Ambulatory Visit | Attending: Orthopedic Surgery | Admitting: Orthopedic Surgery

## 2019-12-02 ENCOUNTER — Other Ambulatory Visit: Payer: Self-pay

## 2019-12-02 DIAGNOSIS — Z01812 Encounter for preprocedural laboratory examination: Secondary | ICD-10-CM | POA: Insufficient documentation

## 2019-12-02 NOTE — Patient Instructions (Addendum)
INSTRUCTIONS FOR SURGERY     Your surgery is scheduled for:   Monday, June 14TH     To find out your arrival time for the day of surgery,          please call [phone] between 1 pm and 3 pm on :  Friday, June 11TH     When you arrive for surgery, report to the [place].       Do NOT stop on the first floor to register.    REMEMBER: Instructions that are not followed completely may result in serious medical risk,  up to and including death, or upon the discretion of your surgeon and anesthesiologist,            your surgery may need to be rescheduled.  __X__ 1. Do not eat food after midnight the night before your procedure.                    No gum, candy, lozenger, tic tacs, tums or hard candies.                  ABSOLUTELY NOTHING SOLID IN YOUR MOUTH AFTER MIDNIGHT                    You may drink unlimited clear liquids up to 2 hours before you are scheduled to arrive for surgery.                   Do not drink anything within those 2 hours unless you need to take medicine, then take the                   smallest amount you need.  Clear liquids include:  water, apple juice without pulp,                   any flavor Gatorade, Black coffee, black tea.  Sugar may be added but no dairy/ honey /lemon.                        Broth and jello is not considered a clear liquid.  __x__  2. On the morning of surgery, please brush your teeth with toothpaste and water. You may rinse with                  mouthwash if you wish but DO NOT SWALLOW TOOTHPASTE OR MOUTHWASH  __X___3. NO alcohol for 24 hours before or after surgery.  __x___ 4.  Do NOT smoke or use e-cigarettes for 24 HOURS PRIOR TO SURGERY.                      DO NOT Use any chewable tobacco products for at least 6 hours prior to surgery.  __x___ 5. If you start any new medication after this appointment and prior to surgery, please  Bring it with you on the day of surgery.  ___x__ 6. Notify your doctor if there is any change in your medical condition, such as fever,  infection, vomitting, diarrhea or any open sores.  __x___ 7.  USE the CHG SOAP as instructed, the night before surgery and the day of surgery.                   Once you have washed with this soap, do NOT use any of the following: Powders, perfumes                    or lotions. Please do not wear make up, hairpins, clips or nail polish. You may wear deodorant.                   Men may shave their face and neck.  Women need to shave 48 hours prior to surgery.                   DO NOT wear ANY jewelry on the day of surgery. If there are rings that are too tight to                    remove easily, please address this prior to the surgery day. Piercings need to be removed.                                                                     NO METAL ON YOUR BODY.                    Do NOT bring any valuables.  If you came to Pre-Admit testing then you will not need license,                     insurance card or credit card.  If you will be staying overnight, please either leave your things in                     the car or have your family be responsible for these items.                     [facility] IS NOT RESPONSIBLE FOR BELONGINGS OR VALUABLES.  ___X__ 8. DO NOT wear contact lenses on surgery day.  You may not have dentures, hearing aides,                   contacts or glasses in the operating room. These items can be                    Placed in the Recovery Room to receive immediately after surgery.  __x___ 9. IF YOU ARE SCHEDULED TO GO HOME ON THE SAME DAY, YOU MUST                   Have someone to drive you home and to stay with you  for the first 24 hours.                    Have an arrangement prior to arriving on surgery day.  ___x__ 10. Take the following medications on the morning of surgery with a sip of water:  1. AMLODIPINE                     2. ZOLOFT                     3. XANAX, if you feel you need it                     4.                 ___x__ 11.  Follow any instructions provided to you by your surgeon.                        Such as enema, clear liquid bowel prep                     ## ON THE MORNING OF SURGERY, PLEASE CONSUME THE G2 DRINK                            BY 2 HOURS PRIOR TO ARRIVAL TO THE HOSPITAL##  __X__  12. STOP  ASPIRIN AND ALL ASPIRIN PRODUCTS AS [id number] 7TH (1 week prior )                       THIS INCLUDES BC POWDERS / GOODIES POWDER  __x___ 13. STOP Anti-inflammatories as of: June 7TH (1 week prior to surgery)                      This includes IBUPROFEN / MOTRIN / ADVIL / ALEVE/ NAPROXYN                    YOU MAY TAKE TYLENOL ANY TIME PRIOR TO SURGERY.  __X____16.  Stop Metformin 2 full days prior to surgery.  LAST DOSE EVENING OF June 11TH(FRI)                          Do NOT take any diabetes medications on surgery day.  ___X___17.  Continue to take the following medications but do not take on the morning of surgery:                          BENZAEPRIL // Leo Rod // SEMAGLUTIDE  ___X___18. If staying overnight, please have appropriate shoes to wear to be able to walk around the unit.                   Wear clean and comfortable clothing to the hospital.  BRING PHONE [place]. IF BRINGING A CELL PHONE, [place].  DON'T FORGET TO WEAR YOUR HEARING AIDES TO [place].  WE WANT YOU TO HEAR    ALL THAT WE HAVE TO SAY TO YOU!!!  GOOD LUCK

## 2019-12-06 ENCOUNTER — Encounter (HOSPITAL_COMMUNITY): Payer: Self-pay | Admitting: Anesthesiology

## 2019-12-06 ENCOUNTER — Encounter
Admission: RE | Admit: 2019-12-06 | Discharge: 2019-12-06 | Disposition: A | Discharge status: Home / Self Care | Payer: Medicare Other | Source: Ambulatory Visit | Attending: Orthopedic Surgery | Admitting: Orthopedic Surgery

## 2019-12-06 ENCOUNTER — Other Ambulatory Visit: Payer: Self-pay

## 2019-12-06 DIAGNOSIS — Z01818 Encounter for other preprocedural examination: Secondary | ICD-10-CM | POA: Diagnosis present

## 2019-12-06 LAB — CBC WITH DIFFERENTIAL/PLATELET
Abs Immature Granulocytes: 0.05 10*3/uL (ref 0.00–0.07)
Basophils Absolute: 0.1 10*3/uL (ref 0.0–0.1)
Basophils Relative: 1 %
Eosinophils Absolute: 0.3 10*3/uL (ref 0.0–0.5)
Eosinophils Relative: 3 %
HCT: 40.4 % (ref 39.0–52.0)
Hemoglobin: 13.6 g/dL (ref 13.0–17.0)
Immature Granulocytes: 1 %
Lymphocytes Relative: 20 %
Lymphs Abs: 1.9 10*3/uL (ref 0.7–4.0)
MCH: 32.5 pg (ref 26.0–34.0)
MCHC: 33.7 g/dL (ref 30.0–36.0)
MCV: 96.7 fL (ref 80.0–100.0)
Monocytes Absolute: 0.8 10*3/uL (ref 0.1–1.0)
Monocytes Relative: 8 %
Neutro Abs: 6.3 10*3/uL (ref 1.7–7.7)
Neutrophils Relative %: 67 %
Platelets: 412 10*3/uL — ABNORMAL HIGH (ref 150–400)
RBC: 4.18 MIL/uL — ABNORMAL LOW (ref 4.22–5.81)
RDW: 12.4 % (ref 11.5–15.5)
WBC: 9.4 10*3/uL (ref 4.0–10.5)
nRBC: 0 % (ref 0.0–0.2)

## 2019-12-06 LAB — COMPREHENSIVE METABOLIC PANEL
ALT: 26 U/L (ref 0–44)
AST: 20 U/L (ref 15–41)
Albumin: 4.1 g/dL (ref 3.5–5.0)
Alkaline Phosphatase: 95 U/L (ref 38–126)
Anion gap: 11 (ref 5–15)
BUN: 15 mg/dL (ref 8–23)
CO2: 26 mmol/L (ref 22–32)
Calcium: 9.1 mg/dL (ref 8.9–10.3)
Chloride: 96 mmol/L — ABNORMAL LOW (ref 98–111)
Creatinine, Ser: 0.92 mg/dL (ref 0.61–1.24)
GFR calc Af Amer: >60 mL/min (ref >60)
GFR calc non Af Amer: >60 mL/min (ref >60)
Glucose, Bld: 302 mg/dL — ABNORMAL HIGH (ref 70–99)
Potassium: 4.2 mmol/L (ref 3.5–5.1)
Sodium: 133 mmol/L — ABNORMAL LOW (ref 135–145)
Total Bilirubin: 0.7 mg/dL (ref 0.3–1.2)
Total Protein: 7.5 g/dL (ref 6.5–8.1)

## 2019-12-06 LAB — TYPE AND SCREEN
ABO/RH(D): A NEG
Antibody Screen: NEGATIVE

## 2019-12-06 LAB — URINALYSIS, ROUTINE W REFLEX MICROSCOPIC
Bacteria, UA: NONE SEEN
Bilirubin Urine: NEGATIVE
Glucose, UA: NEGATIVE mg/dL
Ketones, ur: NEGATIVE mg/dL
Leukocytes,Ua: NEGATIVE
Nitrite: NEGATIVE
Protein, ur: NEGATIVE mg/dL
Specific Gravity, Urine: 1.018 (ref 1.005–1.030)
pH: 6 (ref 5.0–8.0)

## 2019-12-06 LAB — APTT: aPTT: 27 seconds (ref 24–36)

## 2019-12-06 LAB — PROTIME-INR
INR: 0.9 (ref 0.8–1.2)
Prothrombin Time: 12.1 seconds (ref 11.4–15.2)

## 2019-12-06 LAB — SURGICAL PCR SCREEN
MRSA, PCR: NEGATIVE
Staphylococcus aureus: NEGATIVE

## 2019-12-06 LAB — HEMOGLOBIN A1C
Hgb A1c MFr Bld: 8.3 % — ABNORMAL HIGH (ref 4.8–5.6)
Mean Plasma Glucose: 191.51 mg/dL

## 2019-12-06 LAB — C-REACTIVE PROTEIN: CRP: 0.7 mg/dL (ref <1.0)

## 2019-12-06 LAB — SEDIMENTATION RATE: Sed Rate: 12 mm/hr (ref 0–20)

## 2019-12-06 NOTE — Pre-Procedure Instructions (Signed)
Pre-Admit Testing Provider Notification Note  Provider Notified: Dr. Hooten  Notification Mode: Fax  Reason: Abnormal lab result.  Response: Fax confirmation received.   Additional Information: Placed on chart. Noted on Pre-Admit Worksheet.  Signed: Evalyn Shultis, RN  

## 2019-12-07 LAB — URINE CULTURE
Culture: NO GROWTH
Special Requests: NORMAL

## 2019-12-09 ENCOUNTER — Other Ambulatory Visit: Admission: RE | Admit: 2019-12-09 | Payer: Medicare Other | Source: Ambulatory Visit

## 2019-12-13 ENCOUNTER — Encounter: Admission: RE | Payer: Self-pay | Source: Home / Self Care

## 2019-12-13 ENCOUNTER — Inpatient Hospital Stay: Admission: RE | Admit: 2019-12-13 | Payer: Medicare Other | Source: Home / Self Care | Admitting: Orthopedic Surgery

## 2019-12-13 SURGERY — ARTHROPLASTY, HIP, TOTAL,POSTERIOR APPROACH
Anesthesia: Choice | Site: Hip | Laterality: Left

## 2020-02-13 NOTE — Discharge Instructions (Signed)
Instructions after Total Hip Replacement     Jonia Oakey P. Amanada Philbrick, Jr., M.D.     Dept. of Orthopaedics & Sports Medicine  [facility]  [address]  [place], [place]  [address]  Phone: [phone]   Fax: [phone]    DIET: . Drink plenty of non-alcoholic fluids. . Resume your normal diet. Include foods high in fiber.  ACTIVITY:  . You may use crutches or a walker with weight-bearing as tolerated, unless instructed otherwise. . You may be weaned off of the walker or crutches by your Physical Therapist.  . Do NOT reach below the level of your knees or cross your legs until allowed.    . Continue doing gentle exercises. Exercising will reduce the pain and swelling, increase motion, and prevent muscle weakness.   . Please continue to use the TED compression stockings for 6 weeks. You may remove the stockings at night, but should reapply them in the morning. . Do not drive or operate any equipment until instructed.  WOUND CARE:  . Continue to use ice packs periodically to reduce pain and swelling. . Keep the incision clean and dry. . You may bathe or shower after the staples are removed at the first office visit following surgery.  MEDICATIONS: . You may resume your regular medications. . Please take the pain medication as prescribed on the medication. . Do not take pain medication on an empty stomach. . You have been given a prescription for a blood thinner to prevent blood clots. Please take the medication as instructed. (NOTE: After completing a 2 week course of Lovenox, take one Enteric-coated aspirin once a day.) . Pain medications and iron supplements can cause constipation. Use a stool softener (Senokot or Colace) on a daily basis and a laxative (dulcolax or miralax) as needed. . Do not drive or drink alcoholic beverages when taking pain medications.  CALL THE OFFICE FOR: . Temperature above 101 degrees . Excessive bleeding or drainage on the dressing. . Excessive  swelling, coldness, or paleness of the toes. . Persistent nausea and vomiting.  FOLLOW-UP:  . You should have an appointment to return to the office in 6 weeks after surgery. . Arrangements have been made for continuation of Physical Therapy (either home therapy or outpatient therapy).     [facility] Department Directory         [email or web address]       [email or web address]          Cardiology  Appointments: [place] - [phone] [place] - [phone]  Endocrinology  Appointments: [place] - [phone] [place] - [phone]  Gastroenterology  Appointments: [place] - [phone] [place] - [phone]        General Surgery   Appointments: [place] - [phone]  Internal Medicine/Family Medicine  Appointments: [place] - [phone] [place] - [phone] [place] - [phone]  Metabolic and Weigh Loss Surgery  Appointments: [place] - [phone]        Neurology  Appointments: [place] - [phone] [place] - [phone]  Neurosurgery  Appointments: [place] - [phone]  Obstetrics & Gynecology  Appointments: [place] - [phone] [place] - [phone]        Pediatrics  Appointments: [place] - [phone] [place] - [phone]  Physiatry  Appointments: [place] -[phone]  Physical Therapy  Appointments: [place] - [phone] [place] - [phone]        Podiatry  Appointments: [place] - [phone] [place] - [phone]  Pulmonology  Appointments: [place] - [phone]  Rheumatology  Appointments: [place] - [phone]         Location: [facility]  [address] [place], [place]  [address]  [place] Location: [facility] [address] [place], [place]  [address]  [place] Location: [facility] [address] [place], [place]  [address]    

## 2020-02-16 ENCOUNTER — Encounter
Admission: RE | Admit: 2020-02-16 | Discharge: 2020-02-16 | Disposition: A | Discharge status: Home / Self Care | Payer: Medicare Other | Source: Ambulatory Visit | Attending: Orthopedic Surgery | Admitting: Orthopedic Surgery

## 2020-02-16 ENCOUNTER — Other Ambulatory Visit: Payer: Self-pay

## 2020-02-16 DIAGNOSIS — Z01812 Encounter for preprocedural laboratory examination: Secondary | ICD-10-CM | POA: Insufficient documentation

## 2020-02-16 HISTORY — DX: Benign prostatic hyperplasia without lower urinary tract symptoms: N40.0

## 2020-02-16 LAB — CBC WITH DIFFERENTIAL/PLATELET
Abs Immature Granulocytes: 0.03 10*3/uL (ref 0.00–0.07)
Basophils Absolute: 0.1 10*3/uL (ref 0.0–0.1)
Basophils Relative: 1 %
Eosinophils Absolute: 0.3 10*3/uL (ref 0.0–0.5)
Eosinophils Relative: 3 %
HCT: 38.9 % — ABNORMAL LOW (ref 39.0–52.0)
Hemoglobin: 13.7 g/dL (ref 13.0–17.0)
Immature Granulocytes: 0 %
Lymphocytes Relative: 20 %
Lymphs Abs: 2.1 10*3/uL (ref 0.7–4.0)
MCH: 32.9 pg (ref 26.0–34.0)
MCHC: 35.2 g/dL (ref 30.0–36.0)
MCV: 93.3 fL (ref 80.0–100.0)
Monocytes Absolute: 0.9 10*3/uL (ref 0.1–1.0)
Monocytes Relative: 8 %
Neutro Abs: 6.9 10*3/uL (ref 1.7–7.7)
Neutrophils Relative %: 68 %
Platelets: 369 10*3/uL (ref 150–400)
RBC: 4.17 MIL/uL — ABNORMAL LOW (ref 4.22–5.81)
RDW: 12.1 % (ref 11.5–15.5)
WBC: 10.2 10*3/uL (ref 4.0–10.5)
nRBC: 0 % (ref 0.0–0.2)

## 2020-02-16 LAB — URINALYSIS, ROUTINE W REFLEX MICROSCOPIC
Bilirubin Urine: NEGATIVE
Glucose, UA: NEGATIVE mg/dL
Hgb urine dipstick: NEGATIVE
Ketones, ur: NEGATIVE mg/dL
Leukocytes,Ua: NEGATIVE
Nitrite: NEGATIVE
Protein, ur: NEGATIVE mg/dL
Specific Gravity, Urine: 1.004 — ABNORMAL LOW (ref 1.005–1.030)
pH: 7 (ref 5.0–8.0)

## 2020-02-16 LAB — COMPREHENSIVE METABOLIC PANEL
ALT: 12 U/L (ref 0–44)
AST: 18 U/L (ref 15–41)
Albumin: 4.3 g/dL (ref 3.5–5.0)
Alkaline Phosphatase: 48 U/L (ref 38–126)
Anion gap: 12 (ref 5–15)
BUN: 13 mg/dL (ref 8–23)
CO2: 28 mmol/L (ref 22–32)
Calcium: 9.4 mg/dL (ref 8.9–10.3)
Chloride: 95 mmol/L — ABNORMAL LOW (ref 98–111)
Creatinine, Ser: <0.3 mg/dL — ABNORMAL LOW (ref 0.61–1.24)
Glucose, Bld: 138 mg/dL — ABNORMAL HIGH (ref 70–99)
Potassium: 4 mmol/L (ref 3.5–5.1)
Sodium: 135 mmol/L (ref 135–145)
Total Bilirubin: 0.5 mg/dL (ref 0.3–1.2)
Total Protein: 7.9 g/dL (ref 6.5–8.1)

## 2020-02-16 LAB — C-REACTIVE PROTEIN: CRP: 0.6 mg/dL (ref <1.0)

## 2020-02-16 LAB — HEMOGLOBIN A1C
Hgb A1c MFr Bld: 7.3 % — ABNORMAL HIGH (ref 4.8–5.6)
Mean Plasma Glucose: 162.81 mg/dL

## 2020-02-16 LAB — SURGICAL PCR SCREEN
MRSA, PCR: NEGATIVE
Staphylococcus aureus: NEGATIVE

## 2020-02-16 LAB — APTT: aPTT: 28 seconds (ref 24–36)

## 2020-02-16 LAB — PROTIME-INR
INR: 0.9 (ref 0.8–1.2)
Prothrombin Time: 12 seconds (ref 11.4–15.2)

## 2020-02-16 LAB — SEDIMENTATION RATE: Sed Rate: 16 mm/hr (ref 0–20)

## 2020-02-16 NOTE — Patient Instructions (Signed)
Your procedure is scheduled on: Wed. 8/25 Report to Day Surgery. To find out your arrival time please call [phone] between 1PM - 3PM on .Tues 8/24  Remember: Instructions that are not followed completely may result in serious medical risk,  up to and including death, or upon the discretion of your surgeon and anesthesiologist your  surgery may need to be rescheduled.     _X__ 1. Do not eat food after midnight the night before your procedure.                 No chewing gum or hard candies. You may drink clear liquids up to 2 hours                 before you are scheduled to arrive for your surgery- DO not drink clear                 liquids within 2 hours of the start of your surgery.                 Clear Liquids include:  water, apple juice without pulp, clear Gatorade, G2 or                  Gatorade Zero (avoid Red/Purple/Blue), Black Coffee or Tea (Do not add                 anything to coffee or tea). ___x__2.   Complete the "Ensure Clear Pre-surgery Clear Carbohydrate Drink" provided to you, 2 hours before arrival. **If you       are diabetic you will be provided with an alternative drink, Gatorade Zero or G2.  __X__2.  On the morning of surgery brush your teeth with toothpaste and water, you                may rinse your mouth with mouthwash if you wish.  Do not swallow any toothpaste of mouthwash.     _X__ 3.  No Alcohol for 24 hours before or after surgery.   ___ 4.  Do Not Smoke or use e-cigarettes For 24 Hours Prior to Your Surgery.                 Do not use any chewable tobacco products for at least 6 hours prior to                 Surgery.  __  5.  Do not use any recreational drugs (marijuana, cocaine, heroin, ecstasy, MDMA or other)                For at least one week prior to your surgery.  Combination of these drugs with anesthesia                May have life threatening results.  ____  6.  Bring all medications with you on the day of  surgery if instructed.   _x___  7.  Notify your doctor if there is any change in your medical condition      (cold, fever, infections).     Do not wear jewelry, Do not wear lotions, You may wear deodorant. Do not shave 48 hours prior to surgery. Men may shave face and neck. Do not bring valuables to the hospital.    [facility] is not responsible for any belongings or valuables.  Contacts, dentures or bridgework may not be worn into surgery. Leave your suitcase in the car. After surgery it may be  brought to your room. For patients admitted to the hospital, discharge time is determined by your treatment team.   Patients discharged the day of surgery will not be allowed to drive home.   Make arrangements for someone to be with you for the first 24 hours of your Same Day Discharge.    Please read over the following fact sheets that you were given:    _x___ Take these medicines the morning of surgery with A SIP OF WATER:    1. acetaminophen (TYLENOL) 500 MG tablet if needed  2. amLODipine (NORVASC) 10 MG tablet  3. sertraline (ZOLOFT) 50 MG tablet  4.  5.  6.  ____ Fleet Enema (as directed)   _x___ Use CHG Soap (or wipes) as directed  ____ Use Benzoyl Peroxide Gel as instructed  ____ Use inhalers on the day of surgery  ____ Stop metformin 2 days prior to surgery    ____ Take 1/2 of usual insulin dose the night before surgery. No insulin the morning          of surgery.   ___x_ Stop aspirin today  ___x_ Stop Anti-inflammatories ibuprofen (ADVIL,MOTRIN) 200 MG tablet  May take tylenol   ____ Stop supplements until after surgery.    __x__ Bring C-Pap to the hospital.  If you have it by then.   If you have any questions regarding your pre-procedure instructions,  Please call Pre-admit Testing at [phone]

## 2020-02-17 LAB — URINE CULTURE
Culture: NO GROWTH
Special Requests: NORMAL

## 2020-02-17 LAB — TYPE AND SCREEN
ABO/RH(D): A NEG
Antibody Screen: NEGATIVE

## 2020-02-21 ENCOUNTER — Other Ambulatory Visit
Admission: RE | Admit: 2020-02-21 | Discharge: 2020-02-21 | Disposition: A | Discharge status: Home / Self Care | Payer: Medicare Other | Source: Ambulatory Visit | Attending: Orthopedic Surgery | Admitting: Orthopedic Surgery

## 2020-02-21 ENCOUNTER — Other Ambulatory Visit: Payer: Self-pay

## 2020-02-21 DIAGNOSIS — Z01812 Encounter for preprocedural laboratory examination: Secondary | ICD-10-CM | POA: Insufficient documentation

## 2020-02-21 DIAGNOSIS — Z20822 Contact with and (suspected) exposure to covid-19: Secondary | ICD-10-CM | POA: Insufficient documentation

## 2020-02-21 LAB — SARS CORONAVIRUS 2 (TAT 6-24 HRS): SARS Coronavirus 2: NEGATIVE

## 2020-02-22 ENCOUNTER — Other Ambulatory Visit: Payer: Medicare Other

## 2020-02-22 ENCOUNTER — Encounter: Payer: Self-pay | Admitting: Orthopedic Surgery

## 2020-02-22 DIAGNOSIS — N433 Hydrocele, unspecified: Secondary | ICD-10-CM | POA: Insufficient documentation

## 2020-02-22 DIAGNOSIS — D126 Benign neoplasm of colon, unspecified: Secondary | ICD-10-CM | POA: Insufficient documentation

## 2020-02-22 DIAGNOSIS — F4320 Adjustment disorder, unspecified: Secondary | ICD-10-CM | POA: Insufficient documentation

## 2020-02-22 DIAGNOSIS — R609 Edema, unspecified: Secondary | ICD-10-CM | POA: Insufficient documentation

## 2020-02-22 NOTE — H&P (Signed)
[place] Office Visit [name] [name], [place] - 02/16/2020 8:45 AM EDT Chief Complaint Chief Complaint  Patient presents with  . Left Hip - Pain  . Hip Pain  H&P-LT THA -8.25.21/[facility]   Reason for Visit [name] [name] is a 74 y.o. who present today for history and physical. He is to undergo a left total hip arthroplasty on 02/23/2020.Seen in the clinic on 12/02/2019. There have been no change in his condition since that time. Was initially scheduled to undergo a left total hip arthroplasty on 12/13/2019 but due to his A1c being elevated this was postponed. Is now running 7.  Patient has chronic history of lower back and left hip pain. Patient states his left hip pain is and groin pain has increased to the point that significantly interfere with his activities of daily life. His left hip pain is noted to be worse with weightbearing activities. He has not seen any appreciated significant improvement despite activity modification as well as weight reduction and the use of anti-inflammatory medication and amatory aid.  The hip pain is worse with weight bearing. The patient has not appreciated any significant improvement despite activity modification. He is not using any ambulatory aids.   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 He has not returned for follow-up in many years. He denies any significant knee pain, swelling, locking, or giving way. He is not exercising on a regular basis.  Past Medical History Past Medical History:  Diagnosis Date  . Anemia 06/08/2015  Post wu and ? Iron, colon due 2020  . Anxiety states  . Benign non-nodular prostatic hyperplasia with lower urinary tract symptoms 06/08/2015  . Diabetes mellitus type 2, controlled (CMS-HCC) 06/08/2015  . History of blood transfusion  . Hypertension goal BP (blood pressure) < 140/90 06/08/2015  Left arm is higher  . Primary insomnia 06/08/2015   Needing prn ambien  . Pure hypercholesterolemia   Past Surgical History Past Surgical History:  Procedure Laterality Date  . CATARACT EXTRACTION  . Cosmetic face surgery for scar  . EGD 03/17/2019  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  . KNEE ARTHROSCOPY  . Left total knee arthroplasty using computer-assisted navigation 10/26/2012  Dr Marry Guan  . PROSTATE SURGERY  urolift  . Right total knee arthroplasty using computer-assisted navigation 07/27/2012  Dr Marry Guan   Past Family History Family History  Problem Relation Age of Onset  . Diabetes type II Mother  . Hip fracture Mother   Medications Current Outpatient Medications Ordered in Epic  Medication Sig Dispense Refill  . acetaminophen (TYLENOL) 500 MG tablet Take 1,000 mg by mouth 2 (two) times daily as needed for Pain  . ALPRAZolam (XANAX) 0.5 MG tablet TAKE 1 TABLET (0.5 MG TOTAL) BY MOUTH 2 (TWO) TIMES DAILY AS NEEDED FOR SLEEP 60 tablet 2  . amLODIPine (NORVASC) 10 MG tablet TAKE 1 TABLET BY MOUTH ONCE DAILY 90 tablet 3  . aspirin 81 MG EC tablet Take 81 mg by mouth once daily.  . benazepriL (LOTENSIN) 40 MG tablet TAKE 1 TABLET BY MOUTH ONCE DAILY 90 tablet 3  . glimepiride (AMARYL) 1 MG tablet TAKE 1 TABLET BY MOUTH DAILY WITH BREAKFAST 90 tablet 1  . metFORMIN (GLUCOPHAGE-XR) 500 MG XR tablet TAKE 2 TABLETS BY MOUTH DAILY WITH DINNER 180 tablet 3  . naphazoline-pheniramine (EYE ALLERGY RELIEF) 0.025-0.3 % ophthalmic solution 1 drop as needed  . pravastatin (  PRAVACHOL) 10 MG tablet Take 1 tablet (10 mg total) by mouth nightly 90 tablet 11  . semaglutide 14 mg Tab Take 14 mg by mouth once daily Do not cut, crush, or chew 90 tablet 11  . sertraline (ZOLOFT) 50 MG tablet TAKE 1 TABLET BY MOUTH ONCE DAILY 90 tablet 3  . zinc gluconate 50 mg tablet Take by mouth  . ZINC ORAL Take 1 tablet by mouth once daily   No current Epic-ordered facility-administered medications on file.   Allergies No Known  Allergies   Review of Systems A comprehensive 14 point ROS was performed, reviewed, and the pertinent orthopaedic findings are documented in the HPI.  Exam BP 130/70  Ht 179.1 cm (5' 10.5")  Wt (!) 127.6 kg (281 lb 3.2 oz)  BMI 39.78 kg/m   General: Well-developed well-nourished male seen in no acute distress.   HEENT: Atraumatic,normocephalic. Pupils are equal and reactive to light. Oropharynx is clear with moist mucosa  Lungs: Clear to auscultation bilaterally   Cardiovascular: Regular rate and rhythm. Normal S1, S2. No murmurs. No appreciable gallops or rubs. Peripheral pulses are palpable.  Abdomen: Soft, non-tender, nondistended. Bowel sounds present  Extremity: Left Hip: Pelvic tilt: Negative Limb lengths: Equal with the patient standing Soft tissue swelling: Negative Erythema: Negative Crepitance: Negative Tenderness: Greater trochanter is nontender to palpation. Mild pain is elicited by axial compression or extremes of rotation. Atrophy: No atrophy. Good hip flexor and abductor strength. Range of Motion: Reasonably good flexion and rotation is appreciated.  Neurological:  The patient is alert and oriented Sensation to light touch appears to be intact and within normal limits Gross motor strength appeared to be equal to 5/5  Vascular :  Peripheral pulses felt to be palpable. Capillary refill appears to be intact and within normal limits No swelling or edema to the lower extremities  X-ray  1. 3 views of the left hip ordered and interpreted on today's visit shows bone-on-bone superiorly. He is noted to have elongation of the femoral head. Significant osteophyte is noted superiorly as well as inferiorly to the acetabulum. Is also had osteophyte formation to the femoral neck. Of note patient is noted to have moderate degenerative changes to the right hip as well.  Impression  1. Degenerative arthrosis left hip 2. Moderate degenerative arthrosis right  hip  Plan   1. Patient's medication was positive on today's visit. He is to discontinue his aspirin 1 week prior to surgery. 2. Patient schedule later today for preop. 3. Patient instructed on postop course 4. Return to clinic 6 weeks postop. Sooner if any problems  This note was generated in part with voice recognition software and I apologize for any typographical errors that were not detected and corrected   Watt Climes PA  Electronically signed by [name] Bellow, PA at 02/16/2020 9:07 AM EDT

## 2020-02-23 ENCOUNTER — Inpatient Hospital Stay: Payer: Medicare Other | Admitting: Anesthesiology

## 2020-02-23 ENCOUNTER — Inpatient Hospital Stay: Payer: Medicare Other

## 2020-02-23 ENCOUNTER — Other Ambulatory Visit: Payer: Self-pay

## 2020-02-23 ENCOUNTER — Encounter: Payer: Self-pay | Admitting: Orthopedic Surgery

## 2020-02-23 ENCOUNTER — Inpatient Hospital Stay
Admission: RE | Admit: 2020-02-23 | Discharge: 2020-02-24 | DRG: 470 | Disposition: A | Discharge status: Home / Self Care | Payer: Medicare Other | Attending: Orthopedic Surgery | Admitting: Orthopedic Surgery

## 2020-02-23 ENCOUNTER — Encounter
Admission: RE | Disposition: A | Discharge status: Home / Self Care | Payer: Self-pay | Source: Home / Self Care | Attending: Orthopedic Surgery

## 2020-02-23 DIAGNOSIS — Z7982 Long term (current) use of aspirin: Secondary | ICD-10-CM

## 2020-02-23 DIAGNOSIS — M1612 Unilateral primary osteoarthritis, left hip: Secondary | ICD-10-CM | POA: Diagnosis present

## 2020-02-23 DIAGNOSIS — Z79899 Other long term (current) drug therapy: Secondary | ICD-10-CM

## 2020-02-23 DIAGNOSIS — E538 Deficiency of other specified B group vitamins: Secondary | ICD-10-CM | POA: Diagnosis present

## 2020-02-23 DIAGNOSIS — Z7984 Long term (current) use of oral hypoglycemic drugs: Secondary | ICD-10-CM | POA: Diagnosis not present

## 2020-02-23 DIAGNOSIS — Z96649 Presence of unspecified artificial hip joint: Secondary | ICD-10-CM

## 2020-02-23 DIAGNOSIS — E7800 Pure hypercholesterolemia, unspecified: Secondary | ICD-10-CM | POA: Diagnosis present

## 2020-02-23 DIAGNOSIS — Z20822 Contact with and (suspected) exposure to covid-19: Secondary | ICD-10-CM | POA: Diagnosis present

## 2020-02-23 DIAGNOSIS — Z833 Family history of diabetes mellitus: Secondary | ICD-10-CM | POA: Diagnosis not present

## 2020-02-23 DIAGNOSIS — I10 Essential (primary) hypertension: Secondary | ICD-10-CM | POA: Diagnosis present

## 2020-02-23 DIAGNOSIS — Z96642 Presence of left artificial hip joint: Secondary | ICD-10-CM

## 2020-02-23 DIAGNOSIS — Z96653 Presence of artificial knee joint, bilateral: Secondary | ICD-10-CM | POA: Diagnosis present

## 2020-02-23 DIAGNOSIS — F419 Anxiety disorder, unspecified: Secondary | ICD-10-CM | POA: Diagnosis present

## 2020-02-23 DIAGNOSIS — E119 Type 2 diabetes mellitus without complications: Secondary | ICD-10-CM | POA: Diagnosis present

## 2020-02-23 HISTORY — PX: TOTAL HIP ARTHROPLASTY: SHX124

## 2020-02-23 LAB — GLUCOSE, CAPILLARY
Glucose-Capillary: 176 mg/dL — ABNORMAL HIGH (ref 70–99)
Glucose-Capillary: 247 mg/dL — ABNORMAL HIGH (ref 70–99)
Glucose-Capillary: 367 mg/dL — ABNORMAL HIGH (ref 70–99)
Glucose-Capillary: 294 mg/dL — ABNORMAL HIGH (ref 70–99)

## 2020-02-23 SURGERY — ARTHROPLASTY, HIP, TOTAL,POSTERIOR APPROACH
Anesthesia: Spinal | Site: Hip | Laterality: Left

## 2020-02-23 MED ORDER — PROPOFOL 10 MG/ML IV BOLUS
INTRAVENOUS | Status: DC | PRN
  Administered 2020-02-23: 50 mg via INTRAVENOUS

## 2020-02-23 MED ORDER — ONDANSETRON HCL 4 MG PO TABS: 4.0000 mg | ORAL_TABLET | Freq: Four times a day (QID) | ORAL | Status: DC | PRN

## 2020-02-23 MED ORDER — PHENYLEPHRINE HCL (PRESSORS) 10 MG/ML IV SOLN
INTRAVENOUS | Status: DC | PRN
  Administered 2020-02-23 (×2): 100 ug via INTRAVENOUS

## 2020-02-23 MED ORDER — CEFAZOLIN SODIUM-DEXTROSE 2-4 GM/100ML-% IV SOLN
INTRAVENOUS | Status: AC
  Filled 2020-02-23: qty 100

## 2020-02-23 MED ORDER — AMLODIPINE BESYLATE 10 MG PO TABS
10.0000 mg | ORAL_TABLET | Freq: Every day | ORAL | Status: DC
  Administered 2020-02-24: 10 mg via ORAL
  Filled 2020-02-23: qty 1

## 2020-02-23 MED ORDER — GABAPENTIN 300 MG PO CAPS: 300.0000 mg | ORAL_CAPSULE | Freq: Once | ORAL | Status: AC

## 2020-02-23 MED ORDER — BENAZEPRIL HCL 20 MG PO TABS
40.0000 mg | ORAL_TABLET | Freq: Every day | ORAL | Status: DC
  Administered 2020-02-23 – 2020-02-24 (×2): 40 mg via ORAL
  Filled 2020-02-23 (×2): qty 2

## 2020-02-23 MED ORDER — ENOXAPARIN SODIUM 30 MG/0.3ML ~~LOC~~ SOLN
30.0000 mg | Freq: Two times a day (BID) | SUBCUTANEOUS | Status: DC
  Administered 2020-02-24: 30 mg via SUBCUTANEOUS
  Filled 2020-02-23: qty 0.3

## 2020-02-23 MED ORDER — ACETAMINOPHEN 10 MG/ML IV SOLN
INTRAVENOUS | Status: DC | PRN
  Administered 2020-02-23: 1000 mg via INTRAVENOUS

## 2020-02-23 MED ORDER — MAGNESIUM HYDROXIDE 400 MG/5ML PO SUSP
30.0000 mL | Freq: Every day | ORAL | Status: DC
  Administered 2020-02-23 – 2020-02-24 (×2): 30 mL via ORAL
  Filled 2020-02-23 (×2): qty 30

## 2020-02-23 MED ORDER — GABAPENTIN 300 MG PO CAPS
ORAL_CAPSULE | ORAL | Status: AC
  Administered 2020-02-23: 300 mg via ORAL
  Filled 2020-02-23: qty 1

## 2020-02-23 MED ORDER — METFORMIN HCL ER 500 MG PO TB24
1000.0000 mg | ORAL_TABLET | Freq: Every day | ORAL | Status: DC
  Administered 2020-02-23 – 2020-02-24 (×2): 1000 mg via ORAL
  Filled 2020-02-23 (×3): qty 2

## 2020-02-23 MED ORDER — ALPRAZOLAM 0.5 MG PO TABS: 0.5000 mg | ORAL_TABLET | Freq: Every evening | ORAL | Status: DC | PRN

## 2020-02-23 MED ORDER — GLIMEPIRIDE 1 MG PO TABS
1.0000 mg | ORAL_TABLET | Freq: Every day | ORAL | Status: DC
  Administered 2020-02-24: 1 mg via ORAL
  Filled 2020-02-23 (×3): qty 1

## 2020-02-23 MED ORDER — PROPOFOL 500 MG/50ML IV EMUL
INTRAVENOUS | Status: AC
  Filled 2020-02-23: qty 50

## 2020-02-23 MED ORDER — FENTANYL CITRATE (PF) 100 MCG/2ML IJ SOLN
INTRAMUSCULAR | Status: AC
  Administered 2020-02-23: 25 ug via INTRAVENOUS
  Filled 2020-02-23: qty 2

## 2020-02-23 MED ORDER — TRANEXAMIC ACID-NACL 1000-0.7 MG/100ML-% IV SOLN
INTRAVENOUS | Status: AC
  Administered 2020-02-23: 1000 mg via INTRAVENOUS
  Filled 2020-02-23: qty 100

## 2020-02-23 MED ORDER — FLEET ENEMA 7-19 GM/118ML RE ENEM: 1.0000 | ENEMA | Freq: Once | RECTAL | Status: DC | PRN

## 2020-02-23 MED ORDER — MIDAZOLAM HCL 2 MG/2ML IJ SOLN
INTRAMUSCULAR | Status: AC
  Filled 2020-02-23: qty 2

## 2020-02-23 MED ORDER — BISACODYL 10 MG RE SUPP
10.0000 mg | Freq: Every day | RECTAL | Status: DC | PRN
  Filled 2020-02-23: qty 1

## 2020-02-23 MED ORDER — PRAVASTATIN SODIUM 10 MG PO TABS
10.0000 mg | ORAL_TABLET | Freq: Every day | ORAL | Status: DC
  Administered 2020-02-23 – 2020-02-24 (×2): 10 mg via ORAL
  Filled 2020-02-23 (×2): qty 1

## 2020-02-23 MED ORDER — CEFAZOLIN SODIUM-DEXTROSE 2-4 GM/100ML-% IV SOLN
2.0000 g | Freq: Four times a day (QID) | INTRAVENOUS | Status: AC
  Administered 2020-02-23 – 2020-02-24 (×4): 2 g via INTRAVENOUS
  Filled 2020-02-23 (×4): qty 100

## 2020-02-23 MED ORDER — CELECOXIB 200 MG PO CAPS
ORAL_CAPSULE | ORAL | Status: AC
  Administered 2020-02-23: 400 mg via ORAL
  Filled 2020-02-23: qty 2

## 2020-02-23 MED ORDER — FERROUS SULFATE 325 (65 FE) MG PO TABS
325.0000 mg | ORAL_TABLET | Freq: Two times a day (BID) | ORAL | Status: DC
  Administered 2020-02-23 – 2020-02-24 (×3): 325 mg via ORAL
  Filled 2020-02-23 (×3): qty 1

## 2020-02-23 MED ORDER — SENNOSIDES-DOCUSATE SODIUM 8.6-50 MG PO TABS
1.0000 | ORAL_TABLET | Freq: Two times a day (BID) | ORAL | Status: DC
  Administered 2020-02-23 – 2020-02-24 (×2): 1 via ORAL
  Filled 2020-02-23 (×2): qty 1

## 2020-02-23 MED ORDER — DEXAMETHASONE SODIUM PHOSPHATE 10 MG/ML IJ SOLN: 8.0000 mg | Freq: Once | INTRAMUSCULAR | Status: AC

## 2020-02-23 MED ORDER — ACETAMINOPHEN 325 MG PO TABS: 325.0000 mg | ORAL_TABLET | Freq: Four times a day (QID) | ORAL | Status: DC | PRN

## 2020-02-23 MED ORDER — SODIUM CHLORIDE 0.9 % IV SOLN: INTRAVENOUS | Status: DC

## 2020-02-23 MED ORDER — PANTOPRAZOLE SODIUM 40 MG PO TBEC
40.0000 mg | DELAYED_RELEASE_TABLET | Freq: Two times a day (BID) | ORAL | Status: DC
  Administered 2020-02-24: 40 mg via ORAL
  Filled 2020-02-23 (×2): qty 1

## 2020-02-23 MED ORDER — METOCLOPRAMIDE HCL 10 MG PO TABS
10.0000 mg | ORAL_TABLET | Freq: Three times a day (TID) | ORAL | Status: DC
  Administered 2020-02-23 – 2020-02-24 (×5): 10 mg via ORAL
  Filled 2020-02-23 (×5): qty 1

## 2020-02-23 MED ORDER — GABAPENTIN 300 MG PO CAPS
300.0000 mg | ORAL_CAPSULE | Freq: Every day | ORAL | Status: DC
  Administered 2020-02-23: 300 mg via ORAL
  Filled 2020-02-23: qty 1

## 2020-02-23 MED ORDER — ONDANSETRON HCL 4 MG/2ML IJ SOLN
INTRAMUSCULAR | Status: AC
  Filled 2020-02-23: qty 2

## 2020-02-23 MED ORDER — DEXAMETHASONE SODIUM PHOSPHATE 10 MG/ML IJ SOLN
INTRAMUSCULAR | Status: AC
  Administered 2020-02-23: 8 mg via INTRAVENOUS
  Filled 2020-02-23: qty 1

## 2020-02-23 MED ORDER — OXYCODONE HCL 5 MG PO TABS
5.0000 mg | ORAL_TABLET | ORAL | Status: DC | PRN
  Administered 2020-02-23: 5 mg via ORAL
  Filled 2020-02-23: qty 1

## 2020-02-23 MED ORDER — TRANEXAMIC ACID-NACL 1000-0.7 MG/100ML-% IV SOLN: 1000.0000 mg | Freq: Once | INTRAVENOUS | Status: AC

## 2020-02-23 MED ORDER — MIDAZOLAM HCL 5 MG/5ML IJ SOLN
INTRAMUSCULAR | Status: DC | PRN
  Administered 2020-02-23: 2 mg via INTRAVENOUS

## 2020-02-23 MED ORDER — CHLORHEXIDINE GLUCONATE 0.12 % MT SOLN: 15.0000 mL | Freq: Once | OROMUCOSAL | Status: AC

## 2020-02-23 MED ORDER — CEFAZOLIN SODIUM-DEXTROSE 2-4 GM/100ML-% IV SOLN
2.0000 g | INTRAVENOUS | Status: AC
  Administered 2020-02-23: 2 g via INTRAVENOUS
  Administered 2020-02-23: 1 g via INTRAVENOUS

## 2020-02-23 MED ORDER — CHLORHEXIDINE GLUCONATE 0.12 % MT SOLN
OROMUCOSAL | Status: AC
  Administered 2020-02-23: 15 mL via OROMUCOSAL
  Filled 2020-02-23: qty 15

## 2020-02-23 MED ORDER — MENTHOL 3 MG MT LOZG
1.0000 | LOZENGE | OROMUCOSAL | Status: DC | PRN
  Filled 2020-02-23: qty 9

## 2020-02-23 MED ORDER — ONDANSETRON HCL 4 MG/2ML IJ SOLN: 4.0000 mg | Freq: Four times a day (QID) | INTRAMUSCULAR | Status: DC | PRN

## 2020-02-23 MED ORDER — NEOMYCIN-POLYMYXIN B GU 40-200000 IR SOLN
Status: AC
  Filled 2020-02-23: qty 20

## 2020-02-23 MED ORDER — INSULIN ASPART 100 UNIT/ML ~~LOC~~ SOLN
0.0000 [IU] | Freq: Three times a day (TID) | SUBCUTANEOUS | Status: DC
  Administered 2020-02-23: 15 [IU] via SUBCUTANEOUS
  Administered 2020-02-24: 8 [IU] via SUBCUTANEOUS
  Administered 2020-02-24: 3 [IU] via SUBCUTANEOUS
  Administered 2020-02-24: 2 [IU] via SUBCUTANEOUS
  Filled 2020-02-23 (×4): qty 1

## 2020-02-23 MED ORDER — NEOMYCIN-POLYMYXIN B GU 40-200000 IR SOLN
Status: DC | PRN
  Administered 2020-02-23: 2 mL

## 2020-02-23 MED ORDER — PROPOFOL 500 MG/50ML IV EMUL
INTRAVENOUS | Status: DC | PRN
  Administered 2020-02-23: 50 ug/kg/min via INTRAVENOUS

## 2020-02-23 MED ORDER — ONDANSETRON HCL 4 MG/2ML IJ SOLN
INTRAMUSCULAR | Status: DC | PRN
  Administered 2020-02-23: 4 mg via INTRAVENOUS

## 2020-02-23 MED ORDER — OXYCODONE HCL 5 MG PO TABS
ORAL_TABLET | ORAL | Status: AC
Start: 2020-02-23 — End: 2020-02-24
  Filled 2020-02-23: qty 1

## 2020-02-23 MED ORDER — SEMAGLUTIDE 14 MG PO TABS: 14.0000 mg | ORAL_TABLET | Freq: Every day | ORAL | Status: DC

## 2020-02-23 MED ORDER — SERTRALINE HCL 50 MG PO TABS
50.0000 mg | ORAL_TABLET | Freq: Every day | ORAL | Status: DC
  Administered 2020-02-24: 50 mg via ORAL
  Filled 2020-02-23: qty 1

## 2020-02-23 MED ORDER — TRANEXAMIC ACID-NACL 1000-0.7 MG/100ML-% IV SOLN
INTRAVENOUS | Status: AC
  Filled 2020-02-23: qty 100

## 2020-02-23 MED ORDER — FENTANYL CITRATE (PF) 100 MCG/2ML IJ SOLN
INTRAMUSCULAR | Status: AC
  Filled 2020-02-23: qty 2

## 2020-02-23 MED ORDER — FENTANYL CITRATE (PF) 100 MCG/2ML IJ SOLN
50.0000 ug | Freq: Once | INTRAMUSCULAR | Status: AC
  Administered 2020-02-23: 50 ug via INTRAVENOUS

## 2020-02-23 MED ORDER — FAMOTIDINE 20 MG PO TABS: 20.0000 mg | ORAL_TABLET | Freq: Once | ORAL | Status: AC

## 2020-02-23 MED ORDER — ACETAMINOPHEN 10 MG/ML IV SOLN
INTRAVENOUS | Status: AC
  Filled 2020-02-23: qty 100

## 2020-02-23 MED ORDER — PHENOL 1.4 % MT LIQD
1.0000 | OROMUCOSAL | Status: DC | PRN
  Filled 2020-02-23: qty 177

## 2020-02-23 MED ORDER — SODIUM CHLORIDE 0.9 % IV SOLN
INTRAVENOUS | Status: DC | PRN
  Administered 2020-02-23: 15 ug/min via INTRAVENOUS
  Administered 2020-02-23: 25 ug/min via INTRAVENOUS

## 2020-02-23 MED ORDER — OXYCODONE HCL 5 MG PO TABS
10.0000 mg | ORAL_TABLET | ORAL | Status: DC | PRN
  Filled 2020-02-23: qty 2

## 2020-02-23 MED ORDER — FENTANYL CITRATE (PF) 100 MCG/2ML IJ SOLN
INTRAMUSCULAR | Status: AC
Start: 2020-02-23 — End: 2020-02-24
  Filled 2020-02-23: qty 2

## 2020-02-23 MED ORDER — ENSURE ENLIVE PO LIQD: 296.0000 mL | Freq: Once | ORAL | Status: DC

## 2020-02-23 MED ORDER — HYDROMORPHONE HCL 1 MG/ML IJ SOLN: 0.5000 mg | INTRAMUSCULAR | Status: DC | PRN

## 2020-02-23 MED ORDER — TRANEXAMIC ACID-NACL 1000-0.7 MG/100ML-% IV SOLN
1000.0000 mg | INTRAVENOUS | Status: AC
  Administered 2020-02-23: 1000 mg via INTRAVENOUS

## 2020-02-23 MED ORDER — BUPIVACAINE HCL (PF) 0.5 % IJ SOLN
INTRAMUSCULAR | Status: DC | PRN
  Administered 2020-02-23: 3 mL

## 2020-02-23 MED ORDER — ORAL CARE MOUTH RINSE: 15.0000 mL | Freq: Once | OROMUCOSAL | Status: AC

## 2020-02-23 MED ORDER — CELECOXIB 200 MG PO CAPS: 400.0000 mg | ORAL_CAPSULE | Freq: Once | ORAL | Status: AC

## 2020-02-23 MED ORDER — DIPHENHYDRAMINE HCL 12.5 MG/5ML PO ELIX
12.5000 mg | ORAL_SOLUTION | ORAL | Status: DC | PRN
  Filled 2020-02-23: qty 10

## 2020-02-23 MED ORDER — BUPIVACAINE HCL (PF) 0.5 % IJ SOLN
INTRAMUSCULAR | Status: AC
  Filled 2020-02-23: qty 10

## 2020-02-23 MED ORDER — ALUM & MAG HYDROXIDE-SIMETH 200-200-20 MG/5ML PO SUSP: 30.0000 mL | ORAL | Status: DC | PRN

## 2020-02-23 MED ORDER — CEFAZOLIN SODIUM 1 G IJ SOLR
INTRAMUSCULAR | Status: AC
  Filled 2020-02-23: qty 10

## 2020-02-23 MED ORDER — CELECOXIB 200 MG PO CAPS
200.0000 mg | ORAL_CAPSULE | Freq: Two times a day (BID) | ORAL | Status: DC
  Administered 2020-02-24: 200 mg via ORAL
  Filled 2020-02-23 (×2): qty 1

## 2020-02-23 MED ORDER — FENTANYL CITRATE (PF) 100 MCG/2ML IJ SOLN
INTRAMUSCULAR | Status: DC | PRN
  Administered 2020-02-23 (×4): 25 ug via INTRAVENOUS

## 2020-02-23 MED ORDER — FAMOTIDINE 20 MG PO TABS
ORAL_TABLET | ORAL | Status: AC
  Administered 2020-02-23: 20 mg via ORAL
  Filled 2020-02-23: qty 1

## 2020-02-23 MED ORDER — CHLORHEXIDINE GLUCONATE 4 % EX LIQD: 60.0000 mL | Freq: Once | CUTANEOUS | Status: DC

## 2020-02-23 MED ORDER — FENTANYL CITRATE (PF) 100 MCG/2ML IJ SOLN
25.0000 ug | INTRAMUSCULAR | Status: DC | PRN
  Administered 2020-02-23: 25 ug via INTRAVENOUS
  Administered 2020-02-23: 50 ug via INTRAVENOUS
  Administered 2020-02-23 (×2): 25 ug via INTRAVENOUS

## 2020-02-23 MED ORDER — ACETAMINOPHEN 10 MG/ML IV SOLN
1000.0000 mg | Freq: Four times a day (QID) | INTRAVENOUS | Status: AC
  Administered 2020-02-23 – 2020-02-24 (×3): 1000 mg via INTRAVENOUS
  Filled 2020-02-23 (×3): qty 100

## 2020-02-23 MED ORDER — TRAMADOL HCL 50 MG PO TABS: 50.0000 mg | ORAL_TABLET | ORAL | Status: DC | PRN

## 2020-02-23 SURGICAL SUPPLY — 61 items
BLADE DRUM FLTD (BLADE) ×2 IMPLANT
BLADE SAW 90X25X1.19 OSCILLAT (BLADE) ×2 IMPLANT
CANISTER SUCT 1200ML W/VALVE (MISCELLANEOUS) ×2 IMPLANT
CANISTER SUCT 3000ML PPV (MISCELLANEOUS) ×4 IMPLANT
CARTRIDGE OIL MAESTRO DRILL (MISCELLANEOUS) ×1 IMPLANT
COVER WAND RF STERILE (DRAPES) ×2 IMPLANT
DIFFUSER DRILL AIR PNEUMATIC (MISCELLANEOUS) ×2 IMPLANT
DRAPE 3/4 80X56 (DRAPES) ×2 IMPLANT
DRAPE INCISE IOBAN 66X60 STRL (DRAPES) ×2 IMPLANT
DRSG DERMACEA 8X12 NADH (GAUZE/BANDAGES/DRESSINGS) ×2 IMPLANT
DRSG OPSITE POSTOP 4X12 (GAUZE/BANDAGES/DRESSINGS) ×2 IMPLANT
DRSG OPSITE POSTOP 4X14 (GAUZE/BANDAGES/DRESSINGS) IMPLANT
DRSG TEGADERM 4X4.75 (GAUZE/BANDAGES/DRESSINGS) ×2 IMPLANT
DURAPREP 26ML APPLICATOR (WOUND CARE) ×2 IMPLANT
ELECT REM PT RETURN 9FT ADLT (ELECTROSURGICAL) ×2
ELECTRODE REM PT RTRN 9FT ADLT (ELECTROSURGICAL) ×1 IMPLANT
GLOVE BIO SURGEON STRL SZ7.5 (GLOVE) ×4 IMPLANT
GLOVE BIOGEL M STRL SZ7.5 (GLOVE) ×4 IMPLANT
GLOVE BIOGEL PI IND STRL 7.5 (GLOVE) ×1 IMPLANT
GLOVE BIOGEL PI INDICATOR 7.5 (GLOVE) ×1
GLOVE INDICATOR 8.0 STRL GRN (GLOVE) ×2 IMPLANT
GOWN STRL REUS W/ TWL LRG LVL3 (GOWN DISPOSABLE) ×2 IMPLANT
GOWN STRL REUS W/ TWL XL LVL3 (GOWN DISPOSABLE) ×1 IMPLANT
GOWN STRL REUS W/TWL LRG LVL3 (GOWN DISPOSABLE) ×4
GOWN STRL REUS W/TWL XL LVL3 (GOWN DISPOSABLE) ×2
HEAD M SROM 36MM 2 (Hips) IMPLANT
HEMOVAC 400CC 10FR (MISCELLANEOUS) ×2 IMPLANT
HOLDER FOLEY CATH W/STRAP (MISCELLANEOUS) ×2 IMPLANT
HOOD PEEL AWAY FLYTE STAYCOOL (MISCELLANEOUS) ×4 IMPLANT
IRRIGATION SURGIPHOR STRL (IV SOLUTION) ×2 IMPLANT
KIT TURNOVER KIT A (KITS) ×2 IMPLANT
LINER MARATHON 4 NEUTRAL 36X56 (Hips) ×1 IMPLANT
MANIFOLD NEPTUNE II (INSTRUMENTS) ×2 IMPLANT
NDL SAFETY ECLIPSE 18X1.5 (NEEDLE) ×1 IMPLANT
NEEDLE HYPO 18GX1.5 SHARP (NEEDLE) ×2
NS IRRIG 500ML POUR BTL (IV SOLUTION) ×2 IMPLANT
OIL CARTRIDGE MAESTRO DRILL (MISCELLANEOUS) ×2
PACK HIP PROSTHESIS (MISCELLANEOUS) ×2 IMPLANT
PENCIL SMOKE ULTRAEVAC 22 CON (MISCELLANEOUS) ×2 IMPLANT
PIN SECT CUP 56MM (Hips) ×1 IMPLANT
PULSAVAC PLUS IRRIG FAN TIP (DISPOSABLE) ×2
SOL .9 NS 3000ML IRR  AL (IV SOLUTION) ×2
SOL .9 NS 3000ML IRR AL (IV SOLUTION) ×1
SOL .9 NS 3000ML IRR UROMATIC (IV SOLUTION) ×1 IMPLANT
SOL PREP PVP 2OZ (MISCELLANEOUS) ×2
SOLUTION PREP PVP 2OZ (MISCELLANEOUS) ×1 IMPLANT
SPONGE DRAIN TRACH 4X4 STRL 2S (GAUZE/BANDAGES/DRESSINGS) ×2 IMPLANT
SROM M HEAD 36MM 2 (Hips) ×2 IMPLANT
STAPLER SKIN PROX 35W (STAPLE) ×2 IMPLANT
STEM FEM CMNTLSS LG AML 13.5 (Hips) ×1 IMPLANT
SUT ETHIBOND #5 BRAIDED 30INL (SUTURE) ×2 IMPLANT
SUT VIC AB 0 CT1 36 (SUTURE) ×2 IMPLANT
SUT VIC AB 1 CT1 36 (SUTURE) ×4 IMPLANT
SUT VIC AB 2-0 CT1 27 (SUTURE) ×2
SUT VIC AB 2-0 CT1 TAPERPNT 27 (SUTURE) ×1 IMPLANT
SYR 20ML LL LF (SYRINGE) ×2 IMPLANT
TAPE CLOTH 3X10 WHT NS LF (GAUZE/BANDAGES/DRESSINGS) ×2 IMPLANT
TAPE TRANSPORE STRL 2 31045 (GAUZE/BANDAGES/DRESSINGS) ×2 IMPLANT
TIP FAN IRRIG PULSAVAC PLUS (DISPOSABLE) ×1 IMPLANT
TOWEL OR 17X26 4PK STRL BLUE (TOWEL DISPOSABLE) ×2 IMPLANT
TRAY FOLEY MTR SLVR 16FR STAT (SET/KITS/TRAYS/PACK) ×2 IMPLANT

## 2020-02-23 NOTE — Anesthesia Procedure Notes (Signed)
Spinal  Start time: 02/23/2020 7:21 AM End time: 02/23/2020 7:26 AM Staffing Performed: resident/CRNA  Anesthesiologist: Martha Clan, MD Resident/CRNA: Aline Brochure, CRNA Preanesthetic Checklist Completed: patient identified, IV checked, site marked, risks and benefits discussed, surgical consent, monitors and equipment checked and pre-op evaluation Spinal Block Patient position: sitting Prep: ChloraPrep and site prepped and draped Patient monitoring: heart rate, continuous pulse ox and blood pressure Approach: midline Location: L3-4 Injection technique: single-shot Needle Needle type: Pencan  Needle gauge: 24 G

## 2020-02-23 NOTE — Transfer of Care (Signed)
Immediate Anesthesia Transfer of Care Note  Patient: [name] [name]  Procedure(s) Performed: TOTAL HIP ARTHROPLASTY (Left Hip)  Patient Location: PACU  Anesthesia Type:Spinal  Level of Consciousness: awake  Airway & Oxygen Therapy: Patient connected to face mask oxygen  Post-op Assessment: Post -op Vital signs reviewed and stable  Post vital signs: stable  Last Vitals:  Vitals Value Taken Time  BP 95/73 02/23/20 1212  Temp    Pulse 78 02/23/20 1211  Resp 12 02/23/20 1213  SpO2 100 % 02/23/20 1211  Vitals shown include unvalidated device data.  Last Pain:  Vitals:   02/23/20 0618  PainSc: 4          Complications: No complications documented.

## 2020-02-23 NOTE — H&P (Signed)
The patient has been re-examined, and the chart reviewed, and there have been no interval changes to the documented history and physical.    The risks, benefits, and alternatives have been discussed at length. The patient expressed understanding of the risks benefits and agreed with plans for surgical intervention.  Granger Chui P. Mitzy Naron, Jr. M.D.    

## 2020-02-23 NOTE — Anesthesia Preprocedure Evaluation (Signed)
Anesthesia Evaluation  Patient identified by MRN, date of birth, ID band Patient awake    Reviewed: Allergy & Precautions, NPO status , Patient's Chart, lab work & pertinent test results  History of Anesthesia Complications Negative for: history of anesthetic complications  Airway Mallampati: II  TM Distance: >3 FB Neck ROM: Full    Dental  (+) Missing, Dental Advidsory Given   Pulmonary neg shortness of breath, sleep apnea , neg COPD, neg recent URI,    breath sounds clear to auscultation- rhonchi (-) wheezing      Cardiovascular hypertension, Pt. on medications (-) angina(-) CAD, (-) Past MI, (-) Cardiac Stents and (-) CABG (-) dysrhythmias (-) Valvular Problems/Murmurs Rhythm:Regular Rate:Normal - Systolic murmurs and - Diastolic murmurs    Neuro/Psych neg Seizures PSYCHIATRIC DISORDERS Anxiety negative neurological ROS     GI/Hepatic negative GI ROS, Neg liver ROS,   Endo/Other  diabetes, Oral Hypoglycemic Agents  Renal/GU negative Renal ROS     Musculoskeletal  (+) Arthritis ,   Abdominal (+) + obese,   Peds  Hematology  (+) Blood dyscrasia, anemia ,   Anesthesia Other Findings Past Medical History: No date: Anxiety No date: Arthritis No date: Diabetes mellitus No date: Diverticulosis No date: Hemorrhoids No date: HOH (hard of hearing)     Comment:  HOH No date: Hypertension No date: Normocytic anemia No date: Prostate disorder No date: Sleep apnea     Comment:  no cpap   Reproductive/Obstetrics                             Anesthesia Physical  Anesthesia Plan  ASA: III  Anesthesia Plan: Spinal   Post-op Pain Management:    Induction: Intravenous  PONV Risk Score and Plan: 1 and Propofol infusion and TIVA  Airway Management Planned: Natural Airway and Simple Face Mask  Additional Equipment:   Intra-op Plan:   Post-operative Plan:   Informed Consent: I have  reviewed the patients History and Physical, chart, labs and discussed the procedure including the risks, benefits and alternatives for the proposed anesthesia with the patient or authorized representative who has indicated his/her understanding and acceptance.     Dental advisory given  Plan Discussed with: CRNA and Anesthesiologist  Anesthesia Plan Comments:         Anesthesia Quick Evaluation

## 2020-02-23 NOTE — Evaluation (Signed)
Physical Therapy Evaluation Patient Details Name: [name] [name] MRN: [id number] DOB: 06-Oct-1945 Today's Date: 02/23/2020   History of Present Illness  [name] [name] is a 74 y/o male who underwent a L posterior THA on 8/25. PMH includes chronic lower back and L hip pain, anemia, DM II, and pure hypercholesterolemia.  Clinical Impression  Pt lying in bed upon arrival to room with nursing staff at bedside. Pt agreeable to PT evaluation. Pt educated on posterior hip precautions, frequency of treatment, and performed therex in bed. Pt performed sit <> stand transfers with min A for boost into standing and eccentric control on descent. Pt ambulated 100 feet with decreased gait speed, using RW with CGA for safety and steadying. Pt presents with deficits in strength, mobility, ROM, and balance. Recommend skilled PT this acute stay to address current deficits and HHPT at discharge to optimize return to PLOF and maximize functional mobility and independence. Pt has all equipment so none is recommended.    Follow Up Recommendations Home health PT    Equipment Recommendations  None recommended by PT    Recommendations for Other Services       Precautions / Restrictions Precautions Precautions: Posterior Hip;Fall Precaution Booklet Issued: No Restrictions Weight Bearing Restrictions: Yes LLE Weight Bearing: Weight bearing as tolerated      Mobility  Bed Mobility Overal bed mobility: Modified Independent             General bed mobility comments: Mod I for supine to sit from elevated HOB; verbal cues for sequencing and for posterior hip precautions  Transfers Overall transfer level: Needs assistance Equipment used: Rolling walker (2 wheeled) Transfers: Sit to/from Stand Sit to Stand: Min assist         General transfer comment: Min A for sit to stand from lowest bed setting and for stand to sit to recliner chair for eccentric control on descent  Ambulation/Gait Ambulation/Gait  assistance: Min guard Gait Distance (Feet): 100 Feet Assistive device: Rolling walker (2 wheeled) Gait Pattern/deviations: Step-through pattern Gait velocity: decreased   General Gait Details: Pt with reciprocal gait pattern noted when ambulating with RW and CGA for safety and steadying; no LOB noted; verbal cues for sequencing and post. hip precautions  Stairs            Wheelchair Mobility    Modified Rankin (Stroke Patients Only)       Balance Overall balance assessment: Needs assistance Sitting-balance support: Feet supported Sitting balance-Leahy Scale: Good     Standing balance support: Bilateral upper extremity supported;No upper extremity supported Standing balance-Leahy Scale: Good Standing balance comment: good static standing balance with SBA/CGA and dynamic balance with BUE on RW and CGA                             Pertinent Vitals/Pain Pain Assessment: Faces Faces Pain Scale: Hurts little more Pain Location: L hip Pain Descriptors / Indicators: Operative site guarding;Aching;Discomfort;Sore Pain Intervention(s): Monitored during session;Repositioned    Home Living Family/patient expects to be discharged to:: Private residence Living Arrangements: Spouse/significant other Available Help at Discharge: Family;Available 24 hours/day Type of Home: House Home Access: Stairs to enter Entrance Stairs-Rails: Right;Left;Can reach both Entrance Stairs-Number of Steps: 2 Home Layout: One level (has basement but doesn't need to enter) Home Equipment: Walker - 2 wheels;Cane - single point;Bedside commode;Shower seat - built in;Grab bars - toilet      Prior Function Level of Independence: Independent with assistive  device(s)         Comments: Pt reports intermittent usage ambulating with SPC. Started having difficulties with transfers and relied heavily on BUE; pt endorses fall history with no injuries     Hand Dominance         Extremity/Trunk Assessment   Upper Extremity Assessment Upper Extremity Assessment: Overall WFL for tasks assessed (BUE grossly 4 to 5/5 )    Lower Extremity Assessment Lower Extremity Assessment: LLE deficits/detail;Overall WFL for tasks assessed (RLE grossly 4 to 4+/5) LLE Deficits / Details: surgical side LLE: Unable to fully assess due to pain LLE Sensation: WNL       Communication   Communication: No difficulties  Cognition Arousal/Alertness: Awake/alert Behavior During Therapy: WFL for tasks assessed/performed Overall Cognitive Status: Within Functional Limits for tasks assessed                                 General Comments: Pt alert and oriented      General Comments      Exercises Other Exercises Other Exercises: active AP, QS, and GS x 10   Assessment/Plan    PT Assessment Patient needs continued PT services  PT Problem List Decreased strength;Decreased range of motion;Decreased activity tolerance;Decreased balance;Decreased mobility;Decreased knowledge of precautions;Pain       PT Treatment Interventions DME instruction;Gait training;Stair training;Functional mobility training;Therapeutic activities;Therapeutic exercise;Balance training;Patient/family education    PT Goals (Current goals can be found in the Care Plan section)  Acute Rehab PT Goals Patient Stated Goal: "I need to get back to golf." PT Goal Formulation: With patient Time For Goal Achievement: 03/08/20 Potential to Achieve Goals: Good    Frequency BID   Barriers to discharge        Co-evaluation               AM-PAC PT "6 Clicks" Mobility  Outcome Measure Help needed turning from your back to your side while in a flat bed without using bedrails?: A Little Help needed moving from lying on your back to sitting on the side of a flat bed without using bedrails?: A Little Help needed moving to and from a bed to a chair (including a wheelchair)?: A Little Help  needed standing up from a chair using your arms (e.g., wheelchair or bedside chair)?: A Little Help needed to walk in hospital room?: A Little Help needed climbing 3-5 steps with a railing? : A Little 6 Click Score: 18    End of Session Equipment Utilized During Treatment: Gait belt Activity Tolerance: Patient tolerated treatment well Patient left: in chair;with call bell/phone within reach;with chair alarm set;with family/visitor present;with SCD's reapplied Nurse Communication: Mobility status;Other (comment) (call bell not functioning correctly) PT Visit Diagnosis: Unsteadiness on feet (R26.81);Other abnormalities of gait and mobility (R26.89);Muscle weakness (generalized) (M62.81);History of falling (Z91.81);Pain Pain - Right/Left: Left Pain - part of body: Hip    Time: 9390-3009 PT Time Calculation (min) (ACUTE ONLY): 45 min   Charges:              Vale Haven, SPT  Vale Haven 02/23/2020, 5:01 PM

## 2020-02-23 NOTE — Op Note (Signed)
OPERATIVE NOTE  DATE OF SURGERY:  02/23/2020  PATIENT NAME:  [name] [name]   DOB: 01-02-1946  MRN: [id number]  PRE-OPERATIVE DIAGNOSIS: Degenerative arthrosis of the left hip, primary  POST-OPERATIVE DIAGNOSIS:  Same  PROCEDURE:  Left total hip arthroplasty  SURGEON:  Marciano Sequin. M.D.  ASSISTANT:  Cassell Smiles, PA-C (present and scrubbed throughout the case, critical for assistance with exposure, retraction, instrumentation, and closure)  ANESTHESIA: spinal  ESTIMATED BLOOD LOSS: 50 mL  FLUIDS REPLACED: 1700 mL of crystalloid  DRAINS: 2 medium drains to a Hemovac reservoir  IMPLANTS UTILIZED: DePuy 13.5 mm large stature AML femoral stem, 56 mm OD Pinnacle 100 acetabular component, +4 mm neutral Pinnacle Marathon polyethylene insert, and a 36 mm M-SPEC -2 mm hip ball  INDICATIONS FOR SURGERY: Trevaun Rendleman is a 74 y.o. year old male with a long history of progressive hip and groin  pain. X-rays demonstrated severe degenerative changes. The patient had not seen any significant improvement despite conservative nonsurgical intervention. After discussion of the risks and benefits of surgical intervention, the patient expressed understanding of the risks benefits and agree with plans for total hip arthroplasty.   The risks, benefits, and alternatives were discussed at length including but not limited to the risks of infection, bleeding, nerve injury, stiffness, blood clots, the need for revision surgery, limb length inequality, dislocation, cardiopulmonary complications, among others, and they were willing to proceed.  PROCEDURE IN DETAIL: The patient was brought into the operating room and, after adequate spinal anesthesia was achieved, the patient was placed in a right lateral decubitus position. Axillary roll was placed and all bony prominences were well-padded. The patient's left hip was cleaned and prepped with alcohol and DuraPrep and draped in the usual sterile fashion. A "timeout" was  performed as per usual protocol. A lateral curvilinear incision was made gently curving towards the posterior superior iliac spine. The IT band was incised in line with the skin incision and the fibers of the gluteus maximus were split in line. The piriformis tendon was identified, skeletonized, and incised at its insertion to the proximal femur and reflected posteriorly. The gluteal sling was incised so as to better mobilize thefmur.  A T type posterior capsulotomy was performed. Prior to dislocation of the femoral head, a threaded Steinmann pin was inserted through a separate stab incision into the pelvis superior to the acetabulum and bent in the form of a stylus so as to assess limb length and hip offset throughout the procedure. The femoral head was then dislocated posteriorly. Inspection of the femoral head demonstrated severe degenerative changes with full-thickness loss of articular cartilage. The femoral neck cut was performed using an oscillating saw. The anterior capsule was elevated off of the femoral neck using a periosteal elevator. Attention was then directed to the acetabulum. The remnant of the labrum was excised using electrocautery. Inspection of the acetabulum also demonstrated significant degenerative changes. The acetabulum was reamed in sequential fashion up to a 55 mm diameter. Good punctate bleeding bone was encountered. A 56 mm Pinnacle 100 acetabular component was positioned and impacted into place. Good scratch fit was appreciated. A +4 mm polyethylene trial was inserted.  Attention was then directed to the proximal femur. A hole for reaming of the proximal femoral canal was created using a high-speed burr. The femoral canal was reamed in sequential fashion up to a 13 mm diameter. This allowed for approximately 5 cm of scratch fit. Serial broaches were inserted up to a 13.5 mm large  stature femoral broach. Calcar region was planed and a trial reduction was performed using a 36 mm hip  ball with a -2 mm neck length. Good equalization of limb lengths and hip offset was appreciated and excellent stability was noted both anteriorly and posteriorly. Trial components were removed. The acetabular shell was irrigated with copious amounts of normal saline with antibiotic solution and suctioned dry. A +4 mm Pinnacle Marathon polyethylene insert was positioned and impacted into place. Next, a 13.5 mm large stature AML femoral stem was positioned and impacted into place. Excellent scratch fit was appreciated. A trial reduction was again performed with a 36 mm hip ball with a -2 mm neck length. Again, good equalization of limb lengths was appreciated and excellent stability appreciated both anteriorly and posteriorly. The hip was then dislocated and the trial hip ball was removed. The Morse taper was cleaned and dried. A 36 mm M-SPEC hip ball with a -2 mm neck length was placed on the trunnion and impacted into place. The hip was then reduced and placed through range of motion. Excellent stability was appreciated both anteriorly and posteriorly.  The wound was irrigated with copious amounts of normal saline with antibiotic solution and suctioned dry. Good hemostasis was appreciated. The posterior capsulotomy was repaired using #5 Ethibond. Piriformis tendon was reapproximated to the undersurface of the gluteus medius tendon using #5 Ethibond. The gluteal sling was repaired with #5 Ethibond. Two medium drains were placed in the wound bed and brought out through separate stab incisions to be attached to a Hemovac reservoir. The IT band was reapproximated using interrupted sutures of #1 Vicryl. Subcutaneous tissue was approximated using first #0 Vicryl followed by #2-0 Vicryl. The skin was closed with skin staples.  The patient tolerated the procedure well and was transported to the recovery room in stable condition.   Marciano Sequin., M.D.

## 2020-02-24 ENCOUNTER — Encounter: Payer: Self-pay | Admitting: Orthopedic Surgery

## 2020-02-24 LAB — GLUCOSE, CAPILLARY
Glucose-Capillary: 163 mg/dL — ABNORMAL HIGH (ref 70–99)
Glucose-Capillary: 278 mg/dL — ABNORMAL HIGH (ref 70–99)
Glucose-Capillary: 139 mg/dL — ABNORMAL HIGH (ref 70–99)

## 2020-02-24 MED ORDER — ENOXAPARIN SODIUM 40 MG/0.4ML ~~LOC~~ SOLN: 40.0000 mg | SUBCUTANEOUS | 0 refills | Status: DC

## 2020-02-24 MED ORDER — OXYCODONE HCL 5 MG PO TABS
5.0000 mg | ORAL_TABLET | ORAL | 0 refills | Status: DC | PRN
Start: 2020-02-24 — End: 2021-01-02

## 2020-02-24 MED ORDER — CELECOXIB 200 MG PO CAPS: 200.0000 mg | ORAL_CAPSULE | Freq: Two times a day (BID) | ORAL | 0 refills | Status: DC

## 2020-02-24 MED ORDER — TRAMADOL HCL 50 MG PO TABS
50.0000 mg | ORAL_TABLET | ORAL | 0 refills | Status: DC | PRN
Start: 2020-02-24 — End: 2021-01-02

## 2020-02-24 NOTE — Progress Notes (Signed)
Physical Therapy Treatment Patient Details Name: [name] [name] MRN: [id number] DOB: August 19, 1945 Today's Date: 02/24/2020    History of Present Illness [name] [name] is a 74 y/o male who underwent a L posterior THA on 8/25. PMH includes chronic lower back and L hip pain, anemia, DM II, and pure hypercholesterolemia.    PT Comments    Pt received in recliner chair and excited to participate in PT this morning. Pt transferred with supervision to RW and ambulated 250 feet with supervision at speed of 10 feet in 9 seconds. Pt able to negotiate 4 steps with bilateral handrails and supervision for safety. No noted LOB with mobility today and pt required minimal cues to maintain posterior hip precautions. Pt slightly winded after ambulation distance due to decreased endurance. Eduction packet reviewed with patient. Pt then performed active open chain therex seated in recliner chair for promotion of muscle strengthening, joint maintenance, and soft tissue flexibility. Pt with minimal complaints of pain (1/10) prior to exercise and reported pain in L hip/thigh during exercises increasing to 5/10. Pt cleared by PT stand point. All pt questions answered prior to end of session.   Follow Up Recommendations  Home health PT     Equipment Recommendations  None recommended by PT    Recommendations for Other Services       Precautions / Restrictions Precautions Precautions: Posterior Hip;Fall Precaution Booklet Issued: Yes (comment) Restrictions Weight Bearing Restrictions: Yes LLE Weight Bearing: Weight bearing as tolerated    Mobility  Bed Mobility               General bed mobility comments: not performed - in recliner chair upon arrival  Transfers Overall transfer level: Needs assistance Equipment used: Rolling walker (2 wheeled) Transfers: Sit to/from Stand Sit to Stand: Supervision         General transfer comment: Supervision for sit <> stand transfer; verbal cues for hip  precautions and correct LE positioning   Ambulation/Gait Ambulation/Gait assistance: Supervision Gait Distance (Feet): 250 Feet Assistive device: Rolling walker (2 wheeled) Gait Pattern/deviations: Step-through pattern Gait velocity: 10' in 9"   General Gait Details: Supervision to ambulate 250 feet in hallway using RW; no LOB noted and good awareness of turning L foot out with L turns   Stairs Stairs: Yes Stairs assistance: Supervision Stair Management: Two rails;Step to pattern;Forwards Number of Stairs: 4 General stair comments: Pt with good balance while negotiating 4 steps with step-to pattern and [place] of bilat. rails   Wheelchair Mobility    Modified Rankin (Stroke Patients Only)       Balance Overall balance assessment: Needs assistance Sitting-balance support: Feet supported Sitting balance-Leahy Scale: Normal     Standing balance support: No upper extremity supported;Bilateral upper extremity supported Standing balance-Leahy Scale: Good Standing balance comment: supervision for safety and good balance with and without BUE support                            Cognition Arousal/Alertness: Awake/alert Behavior During Therapy: WFL for tasks assessed/performed Overall Cognitive Status: Within Functional Limits for tasks assessed                                        Exercises Other Exercises Other Exercises: active AP, QS, GS, LAQ, and hip abduction x 15 reps each Other Exercises: pt and wife educated on safe car  transfer technique    General Comments        Pertinent Vitals/Pain Pain Assessment: 0-10 Pain Score: 5  Pain Location: L hip Pain Descriptors / Indicators: Operative site guarding;Aching;Discomfort;Sore Pain Intervention(s): Monitored during session;Repositioned    Home Living                      Prior Function            PT Goals (current goals can now be found in the care plan section) Acute Rehab PT  Goals Patient Stated Goal: "I need to get back to golf." PT Goal Formulation: With patient Time For Goal Achievement: 03/08/20 Potential to Achieve Goals: Good Progress towards PT goals: Progressing toward goals    Frequency    BID      PT Plan Current plan remains appropriate    Co-evaluation              AM-PAC PT "6 Clicks" Mobility   Outcome Measure  Help needed turning from your back to your side while in a flat bed without using bedrails?: A Little Help needed moving from lying on your back to sitting on the side of a flat bed without using bedrails?: A Little Help needed moving to and from a bed to a chair (including a wheelchair)?: A Little Help needed standing up from a chair using your arms (e.g., wheelchair or bedside chair)?: A Little Help needed to walk in hospital room?: A Little Help needed climbing 3-5 steps with a railing? : A Little 6 Click Score: 18    End of Session Equipment Utilized During Treatment: Gait belt Activity Tolerance: Patient tolerated treatment well Patient left: in chair;with call bell/phone within reach;with chair alarm set;with family/visitor present Nurse Communication: Mobility status PT Visit Diagnosis: Unsteadiness on feet (R26.81);Other abnormalities of gait and mobility (R26.89);Muscle weakness (generalized) (M62.81);History of falling (Z91.81);Pain Pain - Right/Left: Left Pain - part of body: Hip     Time: 1749-4496 PT Time Calculation (min) (ACUTE ONLY): 28 min  Charges:                       Vale Haven, SPT   Vale Haven 02/24/2020, 11:45 AM

## 2020-02-24 NOTE — TOC Progression Note (Signed)
Transition of Care [facility]) - Progression Note    Patient Details  Name: [name] [name] MRN: [id number] Date of Birth: April 14, 1946  Transition of Care [facility]) CM/SW [place], RN Phone Number: 02/24/2020, 8:50 AM  Clinical Narrative:   Met with the patient to discuss DC plan and needs He lives at home with his wife, He had both Knees done in 2014 and still has the DME from that, he has transportation and can afford his medication, He is set up with [facility] for Home health services from Dr office         Expected Discharge Plan and Services                                                 Social Determinants of Health ([place]) Interventions    Readmission Risk Interventions No flowsheet data found.

## 2020-02-24 NOTE — Progress Notes (Signed)
Discharge Note: Reviewed discharge instructions with pt. Pt verbalized understanding.  IV catheter intact upon removal. Obtained vitals prior to d/c. Compression stockings on BLE. D/C with a walker,  incentive spirometer, and personal belongings.  Staff wheeled pt to vehicle.  Pt transported to home by spouse.

## 2020-02-24 NOTE — Discharge Summary (Signed)
Physician Discharge Summary  Patient ID: [name] [name] MRN: [id number] DOB/AGE: 1945/09/15 74 y.o.  Admit date: 02/23/2020 Discharge date: 02/24/2020  Admission Diagnoses:  Hx of total hip arthroplasty, left [Z96.642]  Surgeries:Procedure(s):  Left total hip arthroplasty  SURGEON:  Marciano Sequin. M.D.  ASSISTANT:  Cassell Smiles, PA-C (present and scrubbed throughout the case, critical for assistance with exposure, retraction, instrumentation, and closure)  ANESTHESIA: spinal  ESTIMATED BLOOD LOSS: 50 mL  FLUIDS REPLACED: 1700 mL of crystalloid  DRAINS: 2 medium drains to a Hemovac reservoir  IMPLANTS UTILIZED: DePuy 13.5 mm large stature AML femoral stem, 56 mm OD Pinnacle 100 acetabular component, +4 mm neutral Pinnacle Marathon polyethylene insert, and a 36 mm M-SPEC -2 mm hip ball  Discharge Diagnoses: Patient Active Problem List   Diagnosis Date Noted   Hx of total hip arthroplasty, left 02/23/2020   Accumulation of fluid in tissues 02/22/2020   Adaptation reaction 02/22/2020   Adenomatous colon polyp 02/22/2020   Hydrocele 02/22/2020   S/P total knee arthroplasty, bilateral 10/17/2019   Myalgia due to statin 04/12/2019   Iron deficiency anemia 03/19/2019   Pure hypercholesterolemia 02/08/2016   Benign non-nodular prostatic hyperplasia with lower urinary tract symptoms 06/08/2015   Anxiety 12/15/2014   At risk for falling 12/15/2014   Diabetes mellitus type 2, controlled ([place]) 12/15/2014   Dyssomnia 12/15/2014   BP (high blood pressure) 12/15/2014   Cannot sleep 12/15/2014   Pain in joint, lower leg 12/15/2014   Adiposity 12/15/2014   Screening for gout 12/15/2014   Avitaminosis D 12/15/2014   Absolute anemia 12/15/2014   Insomnia 12/15/2014   Elevated platelet count 12/15/2014   Hyperlipidemia 12/15/2014   Hemorrhoids    ANEMIA-UNSPECIFIED 01/19/2010   DM 10/31/2008   DIVERTICULOSIS OF COLON 10/31/2008   RECTAL BLEEDING  10/31/2008    Past Medical History:  Diagnosis Date   Anxiety    Arthritis    BPH (benign prostatic hyperplasia)    Diabetes mellitus    Diverticulosis    Hemorrhoids    HOH (hard of hearing)    HOH. wears hearing aides   Hypertension    Normocytic anemia    iron deficiency and b12 deficiency.  followed by oncology   Prostate disorder    Sleep apnea    will need CPAP     Transfusion:    Consultants (if any):   Discharged Condition: Improved  Hospital Course: [name] [name] is an 74 y.o. male who was admitted 02/23/2020 with a diagnosis of left hip osteoarthritis and went to the operating room on 02/23/2020 and underwent left total hip arthroplasty. The patient received perioperative antibiotics for prophylaxis (see below). The patient tolerated the procedure well and was transported to PACU in stable condition. After meeting PACU criteria, the patient was subsequently transferred to the Orthopaedics/Rehabilitation unit.   The patient received DVT prophylaxis in the form of early mobilization, Lovenox, Foot Pumps and TED hose. A sacral pad had been placed and heels were elevated off of the bed with rolled towels in order to protect skin integrity. Foley catheter was discontinued on postoperative day #0. Wound drains were discontinued on postoperative day #2. The surgical incision was healing well without signs of infection.  Physical therapy was initiated postoperatively for transfers, gait training, and strengthening. Occupational therapy was initiated for activities of daily living and evaluation for assisted devices. Rehabilitation goals were reviewed in detail with the patient. The patient made steady progress with physical therapy and physical therapy recommended discharge to  Home.   The patient achieved the preliminary goals of this hospitalization and was felt to be medically and orthopaedically appropriate for discharge.  He was given perioperative antibiotics:   Anti-infectives (From admission, onward)   Start     Dose/Rate Route Frequency Ordered Stop   02/23/20 1517  ceFAZolin (ANCEF) 2-4 GM/100ML-% IVPB       Note to Pharmacy: Jeanene Erb   : cabinet override      02/23/20 1517 02/24/20 0329   02/23/20 1349  ceFAZolin (ANCEF) IVPB 2g/100 mL premix        2 g 200 mL/hr over 30 Minutes Intravenous Every 6 hours 02/23/20 1349 02/24/20 1144   02/23/20 0629  ceFAZolin (ANCEF) 2-4 GM/100ML-% IVPB       Note to Pharmacy: Ronnell Freshwater   : cabinet override      02/23/20 0629 02/23/20 0748   02/23/20 0600  ceFAZolin (ANCEF) IVPB 2g/100 mL premix        2 g 200 mL/hr over 30 Minutes Intravenous On call to O.R. 02/23/20 3299 02/23/20 0756    .  Recent vital signs:  Vitals:   02/24/20 1144 02/24/20 1540  BP: 140/75 130/70  Pulse: 78 84  Resp: 17 18  Temp: 97.9 F (36.6 C) 97.8 F (36.6 C)  SpO2: 100% 100%    Recent laboratory studies:  No results for input(s): WBC, HGB, HCT, PLT, K, CL, CO2, BUN, CREATININE, GLUCOSE, CALCIUM, LABPT, INR in the last 72 hours.  Diagnostic Studies: DG Hip Port Unilat With Pelvis 1V Left  Result Date: 02/23/2020 CLINICAL DATA:  Status post left total hip replacement. EXAM: DG HIP (WITH OR WITHOUT PELVIS) 1V PORT LEFT COMPARISON:  None. FINDINGS: The left acetabular and femoral components appear to be well situated. Expected postoperative changes are noted in the surrounding soft tissues. IMPRESSION: Status post left total hip arthroplasty. Electronically Signed   By: Marijo Conception M.D.   On: 02/23/2020 14:23    Discharge Medications:   Allergies as of 02/24/2020   No Known Allergies     Medication List    STOP taking these medications   aspirin EC 81 MG tablet   ibuprofen 200 MG tablet Commonly known as: ADVIL     TAKE these medications   acetaminophen 500 MG tablet Commonly known as: TYLENOL Take 1,000 mg by mouth every 8 (eight) hours as needed for moderate pain.   ALPRAZolam 0.5 MG  tablet Commonly known as: XANAX Take 0.5-1 mg by mouth at bedtime as needed for sleep.   amLODipine 10 MG tablet Commonly known as: NORVASC Take 1 tablet (10 mg total) by mouth daily.   benazepril 40 MG tablet Commonly known as: LOTENSIN Take 40 mg by mouth daily with supper.   celecoxib 200 MG capsule Commonly known as: CELEBREX Take 1 capsule (200 mg total) by mouth 2 (two) times daily.   enoxaparin 40 MG/0.4ML injection Commonly known as: LOVENOX Inject 0.4 mLs (40 mg total) into the skin daily for 14 days.   glimepiride 1 MG tablet Commonly known as: AMARYL Take 1 mg by mouth daily with breakfast.   metFORMIN 500 MG 24 hr tablet Commonly known as: GLUCOPHAGE-XR Take 1,000 mg by mouth daily with supper.   oxyCODONE 5 MG immediate release tablet Commonly known as: Oxy IR/ROXICODONE Take 1 tablet (5 mg total) by mouth every 4 (four) hours as needed for moderate pain (pain score 4-6).   pravastatin 10 MG tablet Commonly known as: PRAVACHOL Take 10  mg by mouth daily with supper.   Rybelsus 14 MG Tabs Generic drug: Semaglutide Take 14 mg by mouth daily.   sertraline 50 MG tablet Commonly known as: ZOLOFT Take 50 mg by mouth daily.   traMADol 50 MG tablet Commonly known as: ULTRAM Take 1 tablet (50 mg total) by mouth every 4 (four) hours as needed for moderate pain.   zinc gluconate 50 MG tablet Take 50 mg by mouth daily.            Durable Medical Equipment  (From admission, onward)         Start     Ordered   02/23/20 1350  DME Walker rolling  Once       Question:  Patient needs a walker to treat with the following condition  Answer:  S/P total hip arthroplasty   02/23/20 1349   02/23/20 1350  DME Bedside commode  Once       Question:  Patient needs a bedside commode to treat with the following condition  Answer:  S/P total hip arthroplasty   02/23/20 1349          Disposition: home with home health PT      Follow-up Information    Dereck Leep, MD On 04/11/2020.   Specialty: Orthopedic Surgery Why: at 3:00pm Contact information: [place] [address] [place], PA-C 02/24/2020, 5:05 PM

## 2020-02-24 NOTE — Evaluation (Signed)
Occupational Therapy Evaluation Patient Details Name: [name] [name] MRN: [id number] DOB: 1946/05/03 Today's Date: 02/24/2020    History of Present Illness [name] [name] is a 74 y/o male who underwent a L posterior THA on 8/25. PMH includes chronic lower back and L hip pain, anemia, DM II, and pure hypercholesterolemia.   Clinical Impression   Pt seen for OT evaluation this date, POD#1 from above surgery. Pt was independent in all ADLs prior to surgery. Pt is eager to return to PLOF with less pain and improved safety and independence. Pt currently requires SUPV/setup for LB dressing with AE while in seated position due to pain and limited AROM of L hip. Pt able to recall 2/3 posterior total hip precautions at start of session and unable to verbalize how to implement during ADL and mobility. Pt instructed in posterior total hip precautions and how to implement, self care skills, falls prevention strategies, home/routines modifications, DME/AE for LB bathing and dressing tasks, compression stocking mgt strategies, and transfer techniques. At end of session, pt able to recall 3/3 posterior total hip precautions. Pt does not appear to require further instruction d/t good understanding of AE, transfer techniques and care for post-op site. Do not anticipate need for OT f/u upon d/c from acute setting.      Follow Up Recommendations  No OT follow up;Follow surgeon's recommendation for DC plan and follow-up therapies;Supervision - Intermittent    Equipment Recommendations  3 in 1 bedside commode    Recommendations for Other Services       Precautions / Restrictions Precautions Precautions: Posterior Hip;Fall Precaution Booklet Issued: Yes (comment) Restrictions Weight Bearing Restrictions: Yes LLE Weight Bearing: Weight bearing as tolerated      Mobility Bed Mobility               General bed mobility comments: pt up to chair pre/post session  Transfers         General transfer  comment: Supv per PT note, not assessed on OT eval    Balance  Sitting balance-Leahy Scale: Normal                               ADL either performed or assessed with clinical judgement   ADL                                         General ADL Comments: setup with AE for LB dressing, pt familiar with AE from previous knee sx. Only requires <10% verbal cues     Vision Patient Visual Report: No change from baseline       Perception     Praxis      Pertinent Vitals/Pain Pain Assessment: Faces Pain Score: 5  Faces Pain Scale: Hurts little more Pain Location: L hip Pain Descriptors / Indicators: Operative site guarding;Aching;Discomfort;Sore Pain Intervention(s): Monitored during session;Repositioned     Hand Dominance     Extremity/Trunk Assessment Upper Extremity Assessment Upper Extremity Assessment: Overall WFL for tasks assessed   Lower Extremity Assessment Lower Extremity Assessment: Defer to PT evaluation;LLE deficits/detail LLE Deficits / Details: surgical side LLE: Unable to fully assess due to pain       Communication Communication Communication: No difficulties   Cognition Arousal/Alertness: Awake/alert Behavior During Therapy: WFL for tasks assessed/performed Overall Cognitive Status: Within Functional Limits for tasks assessed  General Comments: Pt alert and oriented   General Comments       Exercises Other Exercises: OT facilitates ed re: LB ADLs with AE, sponge bathing until staples come out at f/u appt, rest/ice/elevation to prevent edema. Pt with good understanding. No further ed needed Other Exercises: Pt able to state 2/3 THPs start of OT session, 3/3 after ed, at end of session showing appropriate carryover.   Shoulder Instructions      Home Living Family/patient expects to be discharged to:: Private residence Living Arrangements: Spouse/significant  other Available Help at Discharge: Family;Available 24 hours/day Type of Home: House Home Access: Stairs to enter [employer] of Steps: 2 Entrance Stairs-Rails: Right;Left;Can reach both Home Layout: One level (has basement, but doesn't need to enter)     Bathroom Shower/Tub: Occupational psychologist: Handicapped height     Home Equipment: Environmental consultant - 2 wheels;Cane - single point;Bedside commode;Shower seat - built in;Grab bars - toilet          Prior Functioning/Environment Level of Independence: Independent with assistive device(s)        Comments: Pt reports intermittent usage ambulating with SPC. Started having difficulties with transfers and relied heavily on BUE; pt endorses fall history with no injuries        OT Problem List: Decreased range of motion;Decreased activity tolerance;Decreased knowledge of use of DME or AE      OT Treatment/Interventions: Self-care/ADL training;Therapeutic activities    OT Goals(Current goals can be found in the care plan section) Acute Rehab OT Goals Patient Stated Goal: to go home OT Goal Formulation: All assessment and education complete, DC therapy  OT Frequency:     Barriers to D/C:            Co-evaluation              AM-PAC OT "6 Clicks" Daily Activity     Outcome Measure Help from another person eating meals?: None Help from another person taking care of personal grooming?: None Help from another person toileting, which includes using toliet, bedpan, or urinal?: None Help from another person bathing (including washing, rinsing, drying)?: A Little Help from another person to put on and taking off regular upper body clothing?: None Help from another person to put on and taking off regular lower body clothing?: A Little 6 Click Score: 22   End of Session    Activity Tolerance: Patient tolerated treatment well Patient left: in chair;with call bell/phone within reach;with family/visitor  present  OT Visit Diagnosis: Other abnormalities of gait and mobility (R26.89)                Time: 4235-3614 OT Time Calculation (min): 17 min Charges:  OT General Charges $OT Visit: 1 Visit OT Evaluation $OT Eval Low Complexity: 1 Low OT Treatments $Self Care/Home Management : 8-22 mins  Gerrianne Scale, [place], OTR/L ascom [phone] 02/24/20, 3:33 PM

## 2020-02-24 NOTE — Progress Notes (Signed)
  Subjective: 1 Day Post-Op Procedure(s) (LRB): TOTAL HIP ARTHROPLASTY (Left) Patient reports pain as well-controlled.   Patient is well, and has had no acute complaints or problems Plan is to go Home after hospital stay. Negative for chest pain and shortness of breath Fever: no Gastrointestinal: negative for nausea and vomiting.   Patient has not had a bowel movement.  Objective: Vital signs in last 24 hours: Temp:  [97.1 F (36.2 C)-98.8 F (37.1 C)] 98.4 F (36.9 C) (08/26 0747) Pulse Rate:  [67-106] 77 (08/26 0747) Resp:  [11-20] 17 (08/26 0747) BP: (95-156)/(64-93) 125/84 (08/26 0747) SpO2:  [92 %-100 %] 99 % (08/26 0747)  Intake/Output from previous day:  Intake/Output Summary (Last 24 hours) at 02/24/2020 0909 Last data filed at 02/24/2020 0424 Gross per 24 hour  Intake 4153.11 ml  Output 570 ml  Net 3583.11 ml    Intake/Output this shift: No intake/output data recorded.  Labs: No results for input(s): HGB in the last 72 hours. No results for input(s): WBC, RBC, HCT, PLT in the last 72 hours. No results for input(s): NA, K, CL, CO2, BUN, CREATININE, GLUCOSE, CALCIUM in the last 72 hours. No results for input(s): LABPT, INR in the last 72 hours.   EXAM General - Patient is Alert, Appropriate and Oriented Extremity - Neurovascular intact Dorsiflexion/Plantar flexion intact Compartment soft Dressing/Incision -clean, dry, no drainage, Hemovac in place.  Motor Function - intact, moving foot and toes well on exam.  Cardiovascular- Regular rate and rhythm, no murmurs/rubs/gallops Respiratory- Lungs clear to auscultation bilaterally Gastrointestinal- soft, nontender and hypoactive bowel sounds   Assessment/Plan: 1 Day Post-Op Procedure(s) (LRB): TOTAL HIP ARTHROPLASTY (Left) Active Problems:   Hx of total hip arthroplasty, left  Estimated body mass index is 39.79 kg/m as calculated from the following:   Height as of this encounter: 5' 10.5" (1.791 m).    Weight as of this encounter: 127.6 kg. Advance diet Up with therapy Patient did well in PT yesterday and may discharge today if patient completes therapy goals and has BM.     DVT Prophylaxis - Lovenox, Ted hose and foot pumps Weight-Bearing as tolerated to left leg  Cassell Smiles, PA-C [facility] Orthopaedic Surgery 02/24/2020, 9:09 AM

## 2020-02-24 NOTE — Progress Notes (Signed)
   02/23/20 0800  Clinical Encounter Type  Visited With Family  Visit Type Initial  Referral From Chaplain  Consult/Referral To Chaplain  While rounding SDS waiting area, chaplain stopped by and spoke with pt's wife, Dorian Pod. Pt's wife said she was doing fine. Chaplain wished her well and said she hope hope was well with pt.

## 2020-02-24 NOTE — Anesthesia Postprocedure Evaluation (Signed)
Anesthesia Post Note  Patient: [name] [name]  Procedure(s) Performed: TOTAL HIP ARTHROPLASTY (Left Hip)  Patient location during evaluation: Nursing Unit Anesthesia Type: Spinal Level of consciousness: oriented and awake and alert Pain management: pain level controlled Vital Signs Assessment: post-procedure vital signs reviewed and stable Respiratory status: spontaneous breathing and respiratory function stable Cardiovascular status: blood pressure returned to baseline and stable Postop Assessment: no headache, no backache, no apparent nausea or vomiting and patient able to bend at knees Anesthetic complications: no   No complications documented.   Last Vitals:  Vitals:   02/24/20 0433 02/24/20 0747  BP: (!) 142/77 125/84  Pulse: 74 77  Resp: 15 17  Temp: 36.8 C 36.9 C  SpO2: 100% 99%    Last Pain:  Vitals:   02/24/20 0747  TempSrc: Oral  PainSc:                  Jerrye Noble

## 2020-02-24 NOTE — Progress Notes (Signed)
  Subjective: 1 Day Post-Op Procedure(s) (LRB): TOTAL HIP ARTHROPLASTY (Left) Patient reports pain as well-controlled.   Patient is well, and has had no acute complaints or problems Patient has completed therapy goals and had a BM. He is eager to d/c home.  Objective: Vital signs in last 24 hours: Temp:  [97.8 F (36.6 C)-98.6 F (37 C)] 97.8 F (36.6 C) (08/26 1540) Pulse Rate:  [74-96] 84 (08/26 1540) Resp:  [15-18] 18 (08/26 1540) BP: (121-142)/(70-84) 130/70 (08/26 1540) SpO2:  [97 %-100 %] 100 % (08/26 1540)  Intake/Output from previous day:  Intake/Output Summary (Last 24 hours) at 02/24/2020 1708 Last data filed at 02/24/2020 1500 Gross per 24 hour  Intake 2823.59 ml  Output 270 ml  Net 2553.59 ml    Intake/Output this shift: Total I/O In: 1430.5 [P.O.:960; Other:170; IV Piggyback:300.5] Out: -   Labs: No results for input(s): HGB in the last 72 hours. No results for input(s): WBC, RBC, HCT, PLT in the last 72 hours. No results for input(s): NA, K, CL, CO2, BUN, CREATININE, GLUCOSE, CALCIUM in the last 72 hours. No results for input(s): LABPT, INR in the last 72 hours.   EXAM General - Patient is Alert, Appropriate and Oriented Extremity - Neurovascular intact Dorsiflexion/Plantar flexion intact Compartment soft Dressing/Incision -clean, dry, no drainage, Hemovac in place.  Motor Function - intact, moving foot and toes well on exam.    Assessment/Plan: 1 Day Post-Op Procedure(s) (LRB): TOTAL HIP ARTHROPLASTY (Left) Active Problems:   Hx of total hip arthroplasty, left  Estimated body mass index is 39.79 kg/m as calculated from the following:   Height as of this encounter: 5' 10.5" (1.791 m).   Weight as of this encounter: 127.6 kg. Discharge home with home health  Hemovac removed.   DVT Prophylaxis - Lovenox, Ted hose and foot pumps Weight-Bearing as tolerated to left leg  Cassell Smiles, PA-C [facility] Orthopaedic Surgery 02/24/2020, 5:08  PM

## 2020-02-25 LAB — SURGICAL PATHOLOGY

## 2020-05-09 DIAGNOSIS — G4733 Obstructive sleep apnea (adult) (pediatric): Secondary | ICD-10-CM | POA: Insufficient documentation

## 2020-05-22 ENCOUNTER — Ambulatory Visit: Payer: Medicare Other | Admitting: Oncology

## 2020-05-22 ENCOUNTER — Other Ambulatory Visit: Payer: Medicare Other

## 2020-07-06 ENCOUNTER — Encounter: Payer: Self-pay | Admitting: Urology

## 2020-07-12 ENCOUNTER — Encounter: Payer: Self-pay | Admitting: Urology

## 2020-07-12 ENCOUNTER — Other Ambulatory Visit: Payer: Self-pay

## 2020-07-12 ENCOUNTER — Ambulatory Visit: Payer: Medicare Other | Admitting: Urology

## 2020-07-12 VITALS — BP 176/89 | HR 96 | Ht 70.5 in | Wt 282.0 lb

## 2020-07-12 DIAGNOSIS — N433 Hydrocele, unspecified: Secondary | ICD-10-CM | POA: Diagnosis not present

## 2020-07-12 DIAGNOSIS — N401 Enlarged prostate with lower urinary tract symptoms: Secondary | ICD-10-CM

## 2020-07-12 LAB — BLADDER SCAN AMB NON-IMAGING: Scan Result: 101

## 2020-07-12 MED ORDER — TAMSULOSIN HCL 0.4 MG PO CAPS: 0.4000 mg | ORAL_CAPSULE | Freq: Every day | ORAL | 1 refills | Status: DC

## 2020-07-12 NOTE — Progress Notes (Signed)
07/12/2020 10:03 AM   [name] [name] Feb 17, 1946 [id number]  Referring provider: Kirk Ruths, MD [facility] [place],  [place] [address]  Chief Complaint  Patient presents with  . Hydrocele    HPI: [name] [name] is a 75 y.o. male with a history of left hydrocele and BPH previously followed by Dr. Yves Dill.   Long history of BPH previously on terazosin and dutasteride  Status post TUMT several years ago which was not effective  Status post UroLift January 2020  Last visit with Dr. Yves Dill was October 2020; IPSS at that time was 12/35 and PVR 0 mL by bladder scan  Has noted worsening voiding symptoms including frequency, urgency with occasional episodes of urge incontinence.  He complains of a weak urinary stream  IPSS today 20/35 with a bother rated 4/6  Off prostate meds >1-year  History of left hydrocele which has been treated with aspiration in the past that he estimates is done yearly  Last aspirated September 2020  No bothersome symptoms related to hydrocele and present     PMH: Past Medical History:  Diagnosis Date  . Anxiety   . Arthritis   . BPH (benign prostatic hyperplasia)   . Diabetes mellitus   . Diverticulosis   . Hemorrhoids   . HOH (hard of hearing)    HOH. wears hearing aides  . Hypertension   . Normocytic anemia    iron deficiency and b12 deficiency.  followed by oncology  . Prostate disorder   . Sleep apnea    will need CPAP    Surgical History: Past Surgical History:  Procedure Laterality Date  . CATARACT EXTRACTION W/PHACO Left 07/14/2018   Procedure: CATARACT EXTRACTION PHACO AND INTRAOCULAR LENS PLACEMENT (IOC) LEFT, DIABETIC;  Surgeon: Birder Robson, MD;  Location: [facility] ORS;  Service: Ophthalmology;  Laterality: Left;  [place] 00:50 CDE 8.93 Fluid pack lot # [id number]  . CATARACT EXTRACTION W/PHACO Right 09/01/2018   Procedure: CATARACT EXTRACTION PHACO AND INTRAOCULAR LENS PLACEMENT (IOC)-RIGHT,  DIABETIC;  Surgeon: Birder Robson, MD;  Location: [facility] ORS;  Service: Ophthalmology;  Laterality: Right;  [place] 00:50.9 CDE 6.80 Fluid Pack Lot # [id number]    . COLONOSCOPY    . CYSTOSCOPY WITH INSERTION OF UROLIFT    . CYSTOSCOPY WITH INSERTION OF UROLIFT  08/2018  . ESOPHAGOGASTRODUODENOSCOPY (EGD) WITH PROPOFOL N/A 03/17/2019   Procedure: ESOPHAGOGASTRODUODENOSCOPY (EGD) WITH PROPOFOL;  Surgeon: Toledo, Benay Pike, MD;  Location: [facility] ENDOSCOPY;  Service: Gastroenterology;  Laterality: N/A;  . EYE SURGERY Bilateral    cataract surgery  . 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  . REPLACEMENT TOTAL KNEE Bilateral   . TOTAL HIP ARTHROPLASTY Left 02/23/2020   Procedure: TOTAL HIP ARTHROPLASTY;  Surgeon: Dereck Leep, MD;  Location: [facility] ORS;  Service: Orthopedics;  Laterality: Left;    Home Medications:  Allergies as of 07/12/2020   No Known Allergies     Medication List       Accurate as of July 12, 2020 10:03 AM. If you have any questions, ask your nurse or doctor.        acetaminophen 500 MG tablet Commonly known as: TYLENOL Take 1,000 mg by mouth every 8 (eight) hours as needed for moderate pain.   ALPRAZolam 0.5 MG tablet Commonly known as: XANAX Take 0.5-1 mg by mouth at bedtime as needed for sleep.   amLODipine 10 MG tablet Commonly known as: NORVASC Take 1 tablet (10 mg total) by mouth  daily.   benazepril 40 MG tablet Commonly known as: LOTENSIN Take 40 mg by mouth daily with supper.   celecoxib 200 MG capsule Commonly known as: CELEBREX Take 1 capsule (200 mg total) by mouth 2 (two) times daily.   enoxaparin 40 MG/0.4ML injection Commonly known as: LOVENOX Inject 0.4 mLs (40 mg total) into the skin daily for 14 days.   glimepiride 1 MG tablet Commonly known as: AMARYL Take 1 mg by mouth daily with breakfast.   metFORMIN 500 MG 24 hr tablet Commonly known as: GLUCOPHAGE-XR Take 1,000 mg by mouth daily with supper.   oxyCODONE  5 MG immediate release tablet Commonly known as: Oxy IR/ROXICODONE Take 1 tablet (5 mg total) by mouth every 4 (four) hours as needed for moderate pain (pain score 4-6).   pravastatin 10 MG tablet Commonly known as: PRAVACHOL Take 10 mg by mouth daily with supper.   Rybelsus 14 MG Tabs Generic drug: Semaglutide Take 14 mg by mouth daily.   sertraline 50 MG tablet Commonly known as: ZOLOFT Take 50 mg by mouth daily.   traMADol 50 MG tablet Commonly known as: ULTRAM Take 1 tablet (50 mg total) by mouth every 4 (four) hours as needed for moderate pain.   zinc gluconate 50 MG tablet Take 50 mg by mouth daily.       Allergies: No Known Allergies  Family History: Family History  Problem Relation Age of Onset  . Diabetes Mother   . Alzheimer's disease Mother   . Colon cancer Sister     Social History:  reports that he has never smoked. He has never used smokeless tobacco. He reports current alcohol use. He reports that he does not use drugs.   Physical Exam: BP (!) 176/89   Pulse 96   Ht 5' 10.5" (1.791 m)   Wt 282 lb (127.9 kg)   BMI 39.89 kg/m   Constitutional:  Alert and oriented, No acute distress. HEENT: [place] AT, moist mucus membranes.  Trachea midline, no masses. Cardiovascular: No clubbing, cyanosis, or edema. Respiratory: Normal respiratory effort, no increased work of breathing. GI: Abdomen is soft, nontender, nondistended, no abdominal masses GU: Phallus without lesions, testes descended bilaterally without masses or tenderness. Small left hydrocele. Prostate 35 g, smooth without nodules Skin: No rashes, bruises or suspicious lesions. Neurologic: Grossly intact, no focal deficits, moving all 4 extremities. Psychiatric: Normal mood and affect.   Assessment & Plan:    1. BPH with LUTS  Worsening lower urinary tract symptoms since UroLift January 2020  Bladder scan PVR 101 mL  Restart alpha-blocker.  Rx tamsulosin sent to pharmacy  Follow-up nurse  visit 1 month for repeat bladder scan  2. Left hydrocele   Small left hydrocele, asymptomatic   Abbie Sons, MD  [place] [address] [place], [place] [address] [phone]

## 2020-07-13 ENCOUNTER — Telehealth: Payer: Self-pay | Admitting: Family Medicine

## 2020-07-13 NOTE — Telephone Encounter (Signed)
-----   Message from Abbie Sons, MD sent at 07/12/2020 12:43 PM EST ----- Regarding: Follow-up Since residual was elevated at 101 please schedule follow-up nurse visit with bladder scan in 3 weeks.

## 2020-07-13 NOTE — Telephone Encounter (Signed)
Patient notified and appointment scheduled. 

## 2020-08-05 ENCOUNTER — Other Ambulatory Visit: Payer: Self-pay | Admitting: Urology

## 2020-08-07 ENCOUNTER — Ambulatory Visit (INDEPENDENT_AMBULATORY_CARE_PROVIDER_SITE_OTHER): Payer: Medicare Other

## 2020-08-07 ENCOUNTER — Other Ambulatory Visit: Payer: Self-pay

## 2020-08-07 DIAGNOSIS — N401 Enlarged prostate with lower urinary tract symptoms: Secondary | ICD-10-CM | POA: Diagnosis not present

## 2020-08-07 LAB — BLADDER SCAN AMB NON-IMAGING

## 2020-08-07 MED ORDER — TAMSULOSIN HCL 0.4 MG PO CAPS: 0.4000 mg | ORAL_CAPSULE | Freq: Every day | ORAL | 11 refills | Status: DC

## 2020-08-07 MED ORDER — TAMSULOSIN HCL 0.4 MG PO CAPS
0.4000 mg | ORAL_CAPSULE | Freq: Every day | ORAL | 6 refills | Status: DC
Start: 2020-08-07 — End: 2020-08-07

## 2020-08-07 NOTE — Progress Notes (Signed)
Patient present today for a repeat Bladder Scan after starting Tamsulosin. Patient states his symptoms are doing much better.  Patient can void: 7 ml Performed By: Fonnie Jarvis, CMA  Per Dr. Bernardo Heater patient to follow up in 72mo w/PVR and refill tamsulosin

## 2020-09-25 ENCOUNTER — Encounter: Payer: Self-pay | Admitting: Oncology

## 2020-09-27 NOTE — Telephone Encounter (Signed)
He has not followed up wiith me since 5/21. I can see him and get his labs

## 2020-10-15 DIAGNOSIS — M1611 Unilateral primary osteoarthritis, right hip: Secondary | ICD-10-CM | POA: Insufficient documentation

## 2020-11-11 ENCOUNTER — Encounter: Payer: Self-pay | Admitting: Urology

## 2020-11-17 ENCOUNTER — Other Ambulatory Visit: Payer: Self-pay

## 2020-11-17 MED ORDER — TAMSULOSIN HCL 0.4 MG PO CAPS: 0.8000 mg | ORAL_CAPSULE | Freq: Every day | ORAL | 0 refills | Status: DC

## 2020-11-17 NOTE — Telephone Encounter (Signed)
Per mychart message from Dr. Stoioff:Can increase the tamsulosin to 2 capsules daily and send in 1 month supply. If symptoms do not improve recommend follow-up appointment with PVR   Patient called stating new script was not sent into pharmacy and he was out of medication with doubling his dose.  New script was sent into his pharmacy and message was relayed that he should call back in one month for a refill if his symptoms are better with double dose or an appt if symptoms are not better. Patient verbalized understanding

## 2020-12-24 NOTE — Discharge Instructions (Signed)
Instructions after Total Hip Replacement     Indiana Gamero P. Paiton Fosco, Jr., M.D.     Dept. of Orthopaedics & Sports Medicine  [facility]  [address]  [place], [place]  [address]  Phone: [phone]   Fax: [phone]    DIET: . Drink plenty of non-alcoholic fluids. . Resume your normal diet. Include foods high in fiber.  ACTIVITY:  . You may use crutches or a walker with weight-bearing as tolerated, unless instructed otherwise. . You may be weaned off of the walker or crutches by your Physical Therapist.  . Do NOT reach below the level of your knees or cross your legs until allowed.    . Continue doing gentle exercises. Exercising will reduce the pain and swelling, increase motion, and prevent muscle weakness.   . Please continue to use the TED compression stockings for 6 weeks. You may remove the stockings at night, but should reapply them in the morning. . Do not drive or operate any equipment until instructed.  WOUND CARE:  . Continue to use ice packs periodically to reduce pain and swelling. . Keep the incision clean and dry. . You may bathe or shower after the staples are removed at the first office visit following surgery.  MEDICATIONS: . You may resume your regular medications. . Please take the pain medication as prescribed on the medication. . Do not take pain medication on an empty stomach. . You have been given a prescription for a blood thinner to prevent blood clots. Please take the medication as instructed. (NOTE: After completing a 2 week course of Lovenox, take one Enteric-coated aspirin once a day.) . Pain medications and iron supplements can cause constipation. Use a stool softener (Senokot or Colace) on a daily basis and a laxative (dulcolax or miralax) as needed. . Do not drive or drink alcoholic beverages when taking pain medications.  CALL THE OFFICE FOR: . Temperature above 101 degrees . Excessive bleeding or drainage on the dressing. . Excessive  swelling, coldness, or paleness of the toes. . Persistent nausea and vomiting.  FOLLOW-UP:  . You should have an appointment to return to the office in 6 weeks after surgery. . Arrangements have been made for continuation of Physical Therapy (either home therapy or outpatient therapy).     [facility] Department Directory         [email or web address]       [email or web address]          Cardiology  Appointments: [place] - [phone] [place] - [phone]  Endocrinology  Appointments: [place] - [phone] [place] - [phone]  Gastroenterology  Appointments: [place] - [phone] [place] - [phone]        General Surgery   Appointments: [place] - [phone]  Internal Medicine/Family Medicine  Appointments: [place] - [phone] [place] - [phone] [place] - [phone]  Metabolic and Weigh Loss Surgery  Appointments: [place] - [phone]        Neurology  Appointments: [place] - [phone] [place] - [phone]  Neurosurgery  Appointments: [place] - [phone]  Obstetrics & Gynecology  Appointments: [place] - [phone] [place] - [phone]        Pediatrics  Appointments: [place] - [phone] [place] - [phone]  Physiatry  Appointments: [place] -[phone]  Physical Therapy  Appointments: [place] - [phone] [place] - [phone]        Podiatry  Appointments: [place] - [phone] [place] - [phone]  Pulmonology  Appointments: [place] - [phone]  Rheumatology  Appointments:  - [phone]         Location: [facility]  [address] [place], [place]  [address]  [place] Location: [facility] [address] [place], [place]  [address]  [place] Location: [facility] [address] [place], [place]  [address]    

## 2020-12-27 ENCOUNTER — Other Ambulatory Visit: Payer: Self-pay | Admitting: Urology

## 2020-12-27 NOTE — Patient Instructions (Addendum)
Your procedure is scheduled on:01-10-21 Wednesday Report to the Registration Desk on the 1st floor of the [facility]-Then proceed to the 2nd floor Surgery Desk in the [facility] To find out your arrival time, please call [phone] between 1PM - 3PM on:01-09-21 Tuesday  REMEMBER: Instructions that are not followed completely may result in serious medical risk, up to and including death; or upon the discretion of your surgeon and anesthesiologist your surgery may need to be rescheduled.  Do not eat food after midnight the night before surgery.  No gum chewing, lozengers or hard candies.  You may however, drink water up to 2 hours before you are scheduled to arrive for your surgery. Do not drink anything within 2 hours of your scheduled arrival time.  Type 1 and Type 2 diabetics should only drink water.  In addition, your doctor has ordered for you to drink the provided  Gatorade G2 Drinking this carbohydrate drink up to two hours before surgery helps to reduce insulin resistance and improve patient outcomes. Please complete drinking 2 hours prior to scheduled arrival time.  TAKE THESE MEDICATIONS THE MORNING OF SURGERY WITH A SIP OF WATER: -Sertraline (Zoloft) -Tamsulosin (Flomax)  Stop Metformin 2 days prior to surgery-Last dose on 01-07-21 Sunday  One week prior to surgery: Stop Anti-inflammatories (NSAIDS) such as Advil, Aleve, Ibuprofen, Motrin, Naproxen, Naprosyn and Aspirin based products such as Excedrin, Goodys Powder, BC Powder.You may however, continue to take Tylenol or Tramadol if needed for pain up until the day of surgery-However, you may continue Celebrex up until the day prior to surgery.  Stop ANY OVER THE COUNTER supplements/vitamins 7 days prior to Surgery  No Alcohol for 24 hours before or after surgery.  No Smoking including e-cigarettes for 24 hours prior to surgery.  No chewable tobacco products for at least 6 hours prior to surgery.  No nicotine patches on  the day of surgery.  Do not use any "recreational" drugs for at least a week prior to your surgery.  Please be advised that the combination of cocaine and anesthesia may have negative outcomes, up to and including death. If you test positive for cocaine, your surgery will be cancelled.  On the morning of surgery brush your teeth with toothpaste and water, you may rinse your mouth with mouthwash if you wish. Do not swallow any toothpaste or mouthwash.  Do not wear jewelry, make-up, hairpins, clips or nail polish.  Do not wear lotions, powders, or perfumes.   Do not shave body from the neck down 48 hours prior to surgery just in case you cut yourself which could leave a site for infection.  Also, freshly shaved skin may become irritated if using the CHG soap.  Contact lenses, hearing aids and dentures may not be worn into surgery.  Do not bring valuables to the hospital. [facility] is not responsible for any missing/lost belongings or valuables.   Use CHG Soap as directed on instruction sheet  Bring your C-PAP to the hospital with you  Notify your doctor if there is any change in your medical condition (cold, fever, infection).  Wear comfortable clothing (specific to your surgery type) to the hospital.  After surgery, you can help prevent lung complications by doing breathing exercises.  Take deep breaths and cough every 1-2 hours. Your doctor may order a device called an Incentive Spirometer to help you take deep breaths. When coughing or sneezing, hold a pillow firmly against your incision with both hands. This is called "  splinting." Doing this helps protect your incision. It also decreases belly discomfort.  If you are being admitted to the hospital overnight, leave your suitcase in the car. After surgery it may be brought to your room.  If you are being discharged the day of surgery, you will not be allowed to drive home. You will need a responsible adult (18 years or older) to  drive you home and stay with you that night.   If you are taking public transportation, you will need to have a responsible adult (18 years or older) with you. Please confirm with your physician that it is acceptable to use public transportation.   Please call the [place] Dept. at [phone] if you have any questions about these instructions.  Surgery Visitation Policy:  Patients undergoing a surgery or procedure may have one family member or support person with them as long as that person is not COVID-19 positive or experiencing its symptoms.  That person may remain in the waiting area during the procedure.  Inpatient Visitation:    Visiting hours are 7 a.m. to 8 p.m. Inpatients will be allowed two visitors daily. The visitors may change each day during the patient's stay. No visitors under the age of 55. Any visitor under the age of 83 must be accompanied by an adult. The visitor must pass COVID-19 screenings, use hand sanitizer when entering and exiting the patient's room and wear a mask at all times, including in the patient's room. Patients must also wear a mask when staff or their visitor are in the room. Masking is required regardless of vaccination status.

## 2020-12-28 ENCOUNTER — Other Ambulatory Visit
Admission: RE | Admit: 2020-12-28 | Discharge: 2020-12-28 | Disposition: A | Discharge status: Home / Self Care | Payer: Medicare Other | Source: Ambulatory Visit | Attending: Orthopedic Surgery | Admitting: Orthopedic Surgery

## 2020-12-28 ENCOUNTER — Other Ambulatory Visit: Payer: Self-pay

## 2020-12-28 DIAGNOSIS — Z01818 Encounter for other preprocedural examination: Secondary | ICD-10-CM | POA: Diagnosis present

## 2020-12-28 DIAGNOSIS — I4891 Unspecified atrial fibrillation: Secondary | ICD-10-CM

## 2020-12-28 HISTORY — DX: Other specified abnormal findings of blood chemistry: R79.89

## 2020-12-28 HISTORY — DX: Unspecified atrial fibrillation: I48.91

## 2020-12-28 LAB — COMPREHENSIVE METABOLIC PANEL
ALT: 12 U/L (ref 0–44)
AST: 14 U/L — ABNORMAL LOW (ref 15–41)
Albumin: 4.1 g/dL (ref 3.5–5.0)
Alkaline Phosphatase: 67 U/L (ref 38–126)
Anion gap: 11 (ref 5–15)
BUN: 15 mg/dL (ref 8–23)
CO2: 28 mmol/L (ref 22–32)
Calcium: 9 mg/dL (ref 8.9–10.3)
Chloride: 95 mmol/L — ABNORMAL LOW (ref 98–111)
Creatinine, Ser: 0.81 mg/dL (ref 0.61–1.24)
GFR, Estimated: >60 mL/min (ref >60)
Glucose, Bld: 137 mg/dL — ABNORMAL HIGH (ref 70–99)
Potassium: 3.9 mmol/L (ref 3.5–5.1)
Sodium: 134 mmol/L — ABNORMAL LOW (ref 135–145)
Total Bilirubin: 0.5 mg/dL (ref 0.3–1.2)
Total Protein: 7.7 g/dL (ref 6.5–8.1)

## 2020-12-28 LAB — HEMOGLOBIN A1C
Hgb A1c MFr Bld: 7.3 % — ABNORMAL HIGH (ref 4.8–5.6)
Mean Plasma Glucose: 163 mg/dL

## 2020-12-28 LAB — CBC
HCT: 32.9 % — ABNORMAL LOW (ref 39.0–52.0)
Hemoglobin: 10.9 g/dL — ABNORMAL LOW (ref 13.0–17.0)
MCH: 28.8 pg (ref 26.0–34.0)
MCHC: 33.1 g/dL (ref 30.0–36.0)
MCV: 86.8 fL (ref 80.0–100.0)
Platelets: 493 10*3/uL — ABNORMAL HIGH (ref 150–400)
RBC: 3.79 MIL/uL — ABNORMAL LOW (ref 4.22–5.81)
RDW: 13.6 % (ref 11.5–15.5)
WBC: 7.1 10*3/uL (ref 4.0–10.5)
nRBC: 0 % (ref 0.0–0.2)

## 2020-12-28 LAB — URINALYSIS, ROUTINE W REFLEX MICROSCOPIC
Bilirubin Urine: NEGATIVE
Glucose, UA: NEGATIVE mg/dL
Hgb urine dipstick: NEGATIVE
Ketones, ur: NEGATIVE mg/dL
Leukocytes,Ua: NEGATIVE
Nitrite: NEGATIVE
Protein, ur: NEGATIVE mg/dL
Specific Gravity, Urine: 1.018 (ref 1.005–1.030)
pH: 6 (ref 5.0–8.0)

## 2020-12-28 LAB — C-REACTIVE PROTEIN: CRP: 0.7 mg/dL (ref <1.0)

## 2020-12-28 LAB — TYPE AND SCREEN
ABO/RH(D): A NEG
Antibody Screen: NEGATIVE

## 2020-12-28 LAB — SURGICAL PCR SCREEN
MRSA, PCR: NEGATIVE
Staphylococcus aureus: POSITIVE — AB

## 2020-12-28 LAB — SEDIMENTATION RATE: Sed Rate: 33 mm/hr — ABNORMAL HIGH (ref 0–20)

## 2020-12-28 NOTE — Progress Notes (Signed)
  Perioperative Services Pre-Admission/Anesthesia Testing    Date: 12/28/20  Name: [name] [name] MRN:   [id number]  Re: Preoperative testing and need for preoperative clearance   Case: [id number] Date/Time: 01/10/21 0700   Procedure: TOTAL HIP ARTHROPLASTY (Right: Hip)   Anesthesia type: Choice   Pre-op diagnosis: PRIMARY OSTEOARTHRITIS OF RIGHT HIP.   Location: [facility] OR ROOM 01 / [facility] ORS FOR ANESTHESIA GROUP   Surgeons: Dereck Leep, MD     Patient scheduled for the above procedure on 01/10/2021 with Dr. Skip Estimable.  As part of his presurgical evaluation, patient presented to the PAT clinic on 12/28/2020 for labs and EKG.  In review of his ECG tracing, patient noted to be new onset atrial fibrillation with slow ventricular response at a rate of 58 bpm; see tracing below.  There were no significant ST or T wave changes noted.  Patient's ECG tracing from 12/06/2019 reviewed showing sinus rhythm with first-degree AV block with frequent PVCs at a rate of 79 bpm.    Impression and Plan:  Given patient's development of new onset atrial fibrillation, we discussed the need for referral to cardiology for further evaluation and preoperative clearance.  Patient given the choice of [facility] and [facility]; desires first available. Patient understands the need for referral, however he is concerned citing that his surgery has already been postponed once "because of elevated Hgb A1c levels". Referral order entered. Will follow up to determine when patient will be seen for the ordered consult.   Copy of this note forwarded to primary attending surgeon Marry Guan, MD) to make him aware of the findings from PAT appointment and subsequent referral to cardiology.  Orders Placed This Encounter  Ambulatory referral to Cardiology  Referral Priority:   Routine  Referral Type:   Consultation  Referral Reason:   Specialty Services Required  Requested Specialty:   Cardiology  Number of Visits  Requested:   1  Reason: New onset atrial fibrillation with SVR. Pre-operative evaluation and clearance. Surgery scheduled for 01/10/2021   Honor Loh, MSN, APRN, FNP-C, CEN [facility]  Peri-operative Services Nurse Practitioner Phone: [phone] 12/28/20 10:37 AM  NOTE: This note has been prepared using Dragon dictation software. Despite my best ability to proofread, there is always the potential that unintentional transcriptional errors may still occur from this process

## 2020-12-29 LAB — URINE CULTURE: Special Requests: NORMAL

## 2021-01-02 ENCOUNTER — Other Ambulatory Visit: Payer: Self-pay

## 2021-01-02 ENCOUNTER — Encounter: Payer: Self-pay | Admitting: Cardiovascular Disease

## 2021-01-02 ENCOUNTER — Ambulatory Visit: Payer: Medicare Other | Admitting: Cardiovascular Disease

## 2021-01-02 VITALS — BP 142/68 | HR 90 | Ht 71.0 in | Wt 291.1 lb

## 2021-01-02 DIAGNOSIS — I4891 Unspecified atrial fibrillation: Secondary | ICD-10-CM | POA: Diagnosis not present

## 2021-01-02 DIAGNOSIS — I10 Essential (primary) hypertension: Secondary | ICD-10-CM

## 2021-01-02 DIAGNOSIS — Z01810 Encounter for preprocedural cardiovascular examination: Secondary | ICD-10-CM

## 2021-01-02 DIAGNOSIS — E785 Hyperlipidemia, unspecified: Secondary | ICD-10-CM

## 2021-01-02 NOTE — Progress Notes (Signed)
Cardiology Office Note   Date:  01/02/2021   ID:  [name] [name], DOB 1946-01-13, MRN [id number]  PCP:  Unice Bailey, MD  Cardiologist:   [name], MD   Chief Complaint  Patient presents with   Other    Afib pt needing cardiac clearance. Meds reviewed verbally with pt.      History of Present Illness: [name] [name] is a 75 y.o. male who was referred for preoperative cardiovascular evaluation before right hip surgery.  Recent preop EKG showed atrial fibrillation which was a new finding and that was the reason for referral.  The patient is not aware of previous history of arrhythmia.  He has no prior cardiac history. He has prolonged history of type 2 diabetes, essential hypertension, obesity and hyperlipidemia.  He is not a smoker.  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  Most recent surgery was in August 2021 and he had no complications at that time.  Family history is remarkable for diabetes and possible arrhythmia but no family history of premature coronary artery disease.  The patient used to be very active.  He played football at younger age and is a retired Engineer, structural.  He is usually able to perform his activities of daily living with no limitations in terms of chest pain or shortness of breath.  However, he is limited by his arthritis and joint issues . he has done very well with his previous joint replacement and is looking forward for his upcoming surgery. He denies chest pain or significant dyspnea.  No orthopnea or PND.  Although he was diagnosed with atrial fibrillation, he reports no palpitations, dizziness or syncope.  No previous stroke.    Past Medical History:  Diagnosis Date   Anxiety    Arthritis    BPH (benign prostatic hyperplasia)    Diabetes mellitus    Diverticulosis    Elevated platelet count    Hemorrhoids    HOH (hard of hearing)    HOH. wears hearing aides   Hypertension     Normocytic anemia    iron deficiency and b12 deficiency.  followed by oncology   Prostate disorder    Sleep apnea    CPAP    Past Surgical History:  Procedure Laterality Date   CATARACT EXTRACTION W/PHACO Left 07/14/2018   Procedure: CATARACT EXTRACTION PHACO AND INTRAOCULAR LENS PLACEMENT (IOC) LEFT, DIABETIC;  Surgeon: Birder Robson, MD;  Location: [facility] ORS;  Service: Ophthalmology;  Laterality: Left;  [place] 00:50 CDE 8.93 Fluid pack lot # [id number]   CATARACT EXTRACTION W/PHACO Right 09/01/2018   Procedure: CATARACT EXTRACTION PHACO AND INTRAOCULAR LENS PLACEMENT (IOC)-RIGHT, DIABETIC;  Surgeon: Birder Robson, MD;  Location: [facility] ORS;  Service: Ophthalmology;  Laterality: Right;  [place] 00:50.9 CDE 6.80 Fluid Pack Lot # [id number]     COLONOSCOPY     CYSTOSCOPY WITH INSERTION OF UROLIFT     CYSTOSCOPY WITH INSERTION OF UROLIFT  08/2018   ESOPHAGOGASTRODUODENOSCOPY (EGD) WITH PROPOFOL N/A 03/17/2019   Procedure: ESOPHAGOGASTRODUODENOSCOPY (EGD) WITH PROPOFOL;  Surgeon: Toledo, Benay Pike, MD;  Location: [facility] ENDOSCOPY;  Service: Gastroenterology;  Laterality: N/A;   EYE SURGERY Bilateral    cataract surgery   JOINT REPLACEMENT Bilateral 2014   Total knee replacement.   REPLACEMENT TOTAL KNEE Bilateral    TOTAL HIP ARTHROPLASTY Left 02/23/2020   Procedure: TOTAL HIP ARTHROPLASTY;  Surgeon: Dereck Leep, MD;  Location: [facility] ORS;  Service: Orthopedics;  Laterality: Left;  Current Outpatient Medications  Medication Sig Dispense Refill   acetaminophen (TYLENOL) 500 MG tablet Take 1,000 mg by mouth every 8 (eight) hours as needed for moderate pain.      acetaminophen (TYLENOL) 650 MG CR tablet Take 1,300 mg by mouth every 8 (eight) hours as needed for pain.     ALPRAZolam (XANAX) 0.5 MG tablet Take 1 mg by mouth at bedtime.     amLODipine (NORVASC) 10 MG tablet Take 1 tablet (10 mg total) by mouth daily. 90 tablet 0   amoxicillin (AMOXIL) 500 MG capsule Take 2,000 mg by mouth as  needed (Prior to dental procedures).     benazepril-hydrochlorthiazide (LOTENSIN HCT) 20-12.5 MG tablet Take 1 tablet by mouth every evening.     celecoxib (CELEBREX) 200 MG capsule Take 200 mg by mouth daily.     glimepiride (AMARYL) 1 MG tablet Take 1 mg by mouth daily with breakfast.     metFORMIN (GLUCOPHAGE-XR) 500 MG 24 hr tablet Take 1,000 mg by mouth daily with supper.      naphazoline-glycerin (CLEAR EYES REDNESS) 0.012-0.25 % SOLN Place 1-2 drops into both eyes 4 (four) times daily as needed for eye irritation.     pravastatin (PRAVACHOL) 10 MG tablet Take 10 mg by mouth daily with supper.      Semaglutide (RYBELSUS) 14 MG TABS Take 14 mg by mouth every morning.     sertraline (ZOLOFT) 50 MG tablet Take 50 mg by mouth every morning.     tamsulosin (FLOMAX) 0.4 MG CAPS capsule TAKE 2 CAPSULES BY MOUTH EVERY DAY AFTERBREAKFAST 60 capsule 1   vitamin B-12 (CYANOCOBALAMIN) 1000 MCG tablet Take 1,000 mcg by mouth daily.     zinc gluconate 50 MG tablet Take 50 mg by mouth daily.     No current facility-administered medications for this visit.    Allergies:   Patient has no known allergies.    Social History:  The patient  reports that he has never smoked. He has never used smokeless tobacco. He reports current alcohol use. He reports that he does not use drugs.   Family History:  The patient's family history includes Alzheimer's disease in his mother; Colon cancer in his sister; Diabetes in his mother.    ROS:  Please see the history of present illness.   Otherwise, review of systems are positive for none.   All other systems are reviewed and negative.    PHYSICAL EXAM: VS:  BP (!) 142/68 (BP Location: Right Arm, Patient Position: Sitting, Cuff Size: Large)   Pulse 90   Ht 5\' 11"  (1.803 m)   Wt 291 lb 2 oz (132.1 kg)   SpO2 98%   BMI 40.60 kg/m  , BMI Body mass index is 40.6 kg/m. GEN: Well nourished, well developed, in no acute distress  HEENT: normal  Neck: no JVD, carotid  bruits, or masses Cardiac: Irregularly irregular; no murmurs, rubs, or gallops,no edema  Respiratory:  clear to auscultation bilaterally, normal work of breathing GI: soft, nontender, nondistended, + BS MS: no deformity or atrophy  Skin: warm and dry, no rash Neuro:  Strength and sensation are intact Psych: euthymic mood, full affect   EKG:  EKG is ordered today. The ekg ordered today demonstrates atrial fibrillation with ventricular rate of 90 bpm.  1 PVC.  Poor R wave progression in the anterior leads.   Recent Labs: 12/28/2020: ALT 12; BUN 15; Creatinine, Ser 0.81; Hemoglobin 10.9; Platelets 493; Potassium 3.9; Sodium 134  Lipid Panel    Component Value Date/Time   CHOL 170 12/20/2014 1238   TRIG 75 12/20/2014 1238   HDL 65 12/20/2014 1238   CHOLHDL 2.6 12/20/2014 1238   LDLCALC 90 12/20/2014 1238      Wt Readings from Last 3 Encounters:  01/02/21 291 lb 2 oz (132.1 kg)  12/28/20 286 lb 9.6 oz (130 kg)  07/12/20 282 lb (127.9 kg)       PAD Screen 01/02/2021  Previous PAD dx? No  Previous surgical procedure? No  Pain with walking? No  Feet/toe relief with dangling? No  Painful, non-healing ulcers? No  Extremities discolored? No      ASSESSMENT AND PLAN:  1.  Preoperative cardiovascular evaluation: The patient was recently found to have atrial fibrillation on his EKG but he seems to be completely asymptomatic.  He currently has no symptoms of angina and nothing to suggest heart failure.  His functional capacity is limited by his hip discomfort but overall he is active and activity level is above 4 METS.  I am going to obtain an echocardiogram given his recent diagnosis of atrial fibrillation.  As long as his ejection fraction is normal with no significant valvular abnormalities, the patient can proceed with surgery at an overall low risk from a cardiac standpoint. If he develops tachycardia during surgery, IV metoprolol can be used.  2.  Atrial fibrillation:  Unknown duration.  No significant symptoms other than exertional dyspnea which he reports is chronic and unchanged.  Ventricular rate is controlled without medications.  CHA2DS2-VASc score is 4.  Thus, I do recommend long-term anticoagulation to decrease the chances of thromboembolic complications.  However, given that he is scheduled for surgery next week, I am going to hold off initiation at this time, I recommend initiating Eliquis 5 mg twice daily after surgery once safe from a bleeding standpoint. We will schedule the patient for an urgent echocardiogram to be done before surgery and will have him come back follow-up with [place] in 1 month to consider cardioversion if he remains in atrial fibrillation.  3.  Essential hypertension: Blood pressure is reasonably controlled on amlodipine, Lotensin-hydrochlorothiazide.  4.  Hyperlipidemia: He is currently on pravastatin 10 mg daily.  I do not see a recent lipid profile.    Disposition:   FU with our clinic in 1 month.  Signed,  [name], MD  01/02/2021 3:56 PM    [place]

## 2021-01-02 NOTE — Patient Instructions (Signed)
Medication Instructions:  Your physician recommends that you continue on your current medications as directed. Please refer to the Current Medication list given to you today.  *If you need a refill on your cardiac medications before your next appointment, please call your pharmacy*   Lab Work: None ordered If you have labs (blood work) drawn today and your tests are completely normal, you will receive your results only by: [place] (if you have MyChart) OR A paper copy in the mail If you have any lab test that is abnormal or we need to change your treatment, we will call you to review the results.   Testing/Procedures:   Your physician has requested that you have an echocardiogram. Echocardiography is a painless test that uses sound waves to create images of your heart. It provides your doctor with information about the size and shape of your heart and how well your heart's chambers and valves are working. This procedure takes approximately one hour. There are no restrictions for this procedure.    Follow-Up: At [facility], you and your health needs are our priority.  As part of our continuing mission to provide you with exceptional heart care, we have created designated Provider Care Teams.  These Care Teams include your primary Cardiologist (physician) and Advanced Practice Providers (APPs -  Physician Assistants and Nurse Practitioners) who all work together to provide you with the care you need, when you need it.  We recommend signing up for the patient portal called "MyChart".  Sign up information is provided on this After Visit Summary.  MyChart is used to connect with patients for Virtual Visits (Telemedicine).  Patients are able to view lab/test results, encounter notes, upcoming appointments, etc.  Non-urgent messages can be sent to your provider as well.   To learn more about what you can do with MyChart, go to [email or web address].    Your next appointment:   1  month(s)  The format for your next appointment:   In Person  Provider:   You may see Dr. Fletcher Anon or one of the following Advanced Practice Providers on your designated Care Team:   Murray Hodgkins, NP Christell Faith, PA-C Marrianne Mood, PA-C Cadence Cuney, [place] Laurann Montana, NP   Other Instructions

## 2021-01-03 ENCOUNTER — Ambulatory Visit (INDEPENDENT_AMBULATORY_CARE_PROVIDER_SITE_OTHER): Payer: Medicare Other

## 2021-01-03 DIAGNOSIS — I4891 Unspecified atrial fibrillation: Secondary | ICD-10-CM | POA: Diagnosis not present

## 2021-01-03 LAB — ECHOCARDIOGRAM COMPLETE
AR max vel: 3.01 cm2
AV Area VTI: 3.05 cm2
AV Area mean vel: 3.16 cm2
AV Mean grad: 3 mmHg
AV Peak grad: 5.6 mmHg
Ao pk vel: 1.18 m/s
S' Lateral: 3 cm

## 2021-01-03 MED ORDER — PERFLUTREN LIPID MICROSPHERE
1.0000 mL | INTRAVENOUS | Status: AC | PRN
  Administered 2021-01-03: 2 mL via INTRAVENOUS

## 2021-01-05 ENCOUNTER — Telehealth: Payer: Self-pay | Admitting: Urgent Care

## 2021-01-05 ENCOUNTER — Encounter: Payer: Self-pay | Admitting: Orthopedic Surgery

## 2021-01-05 NOTE — Progress Notes (Addendum)
Perioperative Services Pre-Admission/Anesthesia Testing    Date: 01/05/21  Name: [name] [name] MRN:   [id number]  Re: Preoperative cardiovascular clearance  Patient is scheduled to undergo a total hip arthroplasty on 01/10/2021 with Dr. Skip Estimable.  Preoperative ECG revealed new onset atrial fibrillation.  Patient was referred to cardiology graciously agreed to see patient in expedited consult.  Patient was seen on 01/02/2021 by Dr. [name], MD; notes reviewed.  Patient reported being asymptomatic; no chest pain, shortness breath, PND, orthopnea, palpitations, peripheral edema, vertiginous symptoms, or presyncope/syncope.  Patient's main complaint was orthopedic pain.  Patient does not have a previous cardiovascular history, however he does have cardiovascular risk factors including T2DM, HTN, HLD, and obesity (BMI 40.60 kg/m).  Patient is a non-smoker.  Blood pressure reasonably controlled at 142/68 on prescribed CCB, ACEi, and diuretic therapies.  Patient is on a statin for his HLD.  T2DM well controlled on currently prescribed regimen; last Hgb A1c 7.3% on 12/28/2020. Functional capacity, as defined by DASI, is documented as being >/= 4 METS. CHA2DS2-VASc Score = 4 (age x 2, HTN, T2DM).  Given patient's pending surgical procedure, the decision was made to defer initiation of chronic anticoagulation therapy. Patient sent for preoperative TTE.  Cardiology noted that if study was negative, patient could proceed with a low risk.  Patient to follow-up with outpatient cardiology in 1 month discuss DCCV procedure should his atrial fibrillation persist.  Interval Procedures:  TRANSTHORACIC ECHOCARDIOGRAM performed 01/03/2021 Left ventricular ejection fraction, by estimation, is 55 to 60%. The left ventricle has normal function. The left ventricle has no regional wall motion abnormalities. Left ventricular diastolic parameters are indeterminate.  Right ventricular systolic function is normal. The  right ventricular size is normal.  Left atrial size was mildly dilated.  The mitral valve is normal in structure. Trivial mitral valve regurgitation.  The aortic valve is tricuspid. Aortic valve regurgitation is not visualized.  The inferior vena cava is normal in size with greater than 50% respiratory variability, suggesting right atrial pressure of 3 mmHg.   Impression and Plan:  [name] [name] has been referred for pre-anesthesia review and clearance prior to him undergoing the planned anesthetic and procedural courses. Available labs, pertinent testing, and imaging results were personally reviewed by me. This patient has been appropriately cleared by cardiology with an overall LOW risk of significant perioperative cardiovascular complications.    Cardiology recommending that patient be discharged home on apixaban.  Dr. Fletcher Anon has spoken with primary attending surgeon Marry Guan, MD) regarding this.  MD advised staff to inform patient that if he is not sent home on anticoagulation therapy, he should contact the cardiology office so that they can get him started on therapy; communicated to patient by cardiology practice when TTE results reviewed with him .   Per cardiology, if patient develops intraoperative tachycardia, intravenous metoprolol can be used.  Based on clinical review performed today (01/05/21), barring any significant acute changes in the patient's overall condition, it is anticipated that he will be able to proceed with the planned surgical intervention. Any acute changes in clinical condition may necessitate his procedure being postponed and/or cancelled. Patient will meet with anesthesia team (MD and/or CRNA) on this day of his procedure for preoperative evaluation/assessment.   Pre-surgical instructions were reviewed with the patient during his PAT appointment and questions were fielded by PAT clinical staff. Patient was advised that if any questions or concerns arise prior to his  procedure then he should return a call to PAT and/or his  surgeon's office to discuss.  Honor Loh, MSN, APRN, FNP-C, CEN [facility]  Peri-operative Services Nurse Practitioner Phone: [phone] 01/05/21 9:30 AM  NOTE: This note has been prepared using Dragon dictation software. Despite my best ability to proofread, there is always the potential that unintentional transcriptional errors may still occur from this process.

## 2021-01-08 ENCOUNTER — Other Ambulatory Visit: Payer: Self-pay

## 2021-01-08 ENCOUNTER — Other Ambulatory Visit
Admission: RE | Admit: 2021-01-08 | Discharge: 2021-01-08 | Disposition: A | Discharge status: Home / Self Care | Payer: Medicare Other | Source: Ambulatory Visit | Attending: Orthopedic Surgery | Admitting: Orthopedic Surgery

## 2021-01-08 DIAGNOSIS — Z20822 Contact with and (suspected) exposure to covid-19: Secondary | ICD-10-CM | POA: Insufficient documentation

## 2021-01-08 DIAGNOSIS — Z01812 Encounter for preprocedural laboratory examination: Secondary | ICD-10-CM | POA: Insufficient documentation

## 2021-01-08 LAB — SARS CORONAVIRUS 2 (TAT 6-24 HRS): SARS Coronavirus 2: NEGATIVE

## 2021-01-09 ENCOUNTER — Encounter: Payer: Self-pay | Admitting: Orthopedic Surgery

## 2021-01-09 NOTE — H&P (Signed)
ORTHOPAEDIC HISTORY & PHYSICAL Gwenlyn Fudge, [place] - 12/29/2020 10:45 AM EDT Formatting of this note is different from the original. [facility] Chief Complaint:   Chief Complaint  Patient presents with   Hip Pain  H & P RIGHT HIP   History of Present Illness:   [name] [name] is a 75 y.o. male that presents to clinic today for his preoperative history and evaluation. Patient presents unaccompanied. The patient is scheduled to undergo a right total hip arthroplasty on 01/10/21 by Dr. Marry Guan. His pain began 5 months ago. The pain is located in the right hip and thigh. He describes his pain as worse with weightbearing. He reports associated difficulty ambulating long distances as well as decreased range of motion in the right hip. He denies associated numbness or tingling.   The patient's symptoms have progressed to the point that they decrease his quality of life. The patient has previously undergone conservative treatment including NSAIDS and activity modification without adequate control of his symptoms. Patient did receive temporary but significant relief following an intra-articular lidocaine injection to the right hip.  Patient is currently scheduled to see cardiology on 01/02/21 for evaluation of new EKG changes. Denies history of lumbar fusion, blood clots.   Past Medical, Surgical, Family, Social History, Allergies, Medications:   Past Medical History:  Past Medical History:  Diagnosis Date   Anemia 06/08/2015  Post wu and ? Iron, colon due 2020   Anxiety states   Benign non-nodular prostatic hyperplasia with lower urinary tract symptoms 06/08/2015   Diabetes mellitus type 2, controlled (CMS-HCC) 06/08/2015   History of blood transfusion   Hypertension goal BP (blood pressure) < 140/90 06/08/2015  Left arm is higher   OSA on CPAP 05/09/2020   Primary insomnia 06/08/2015  Needing prn ambien   Primary osteoarthritis of right hip  10/15/2020   Pure hypercholesterolemia   Past Surgical History:  Past Surgical History:  Procedure Laterality Date   CATARACT EXTRACTION   Cosmetic face surgery for scar   EGD 03/17/2019  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   KNEE ARTHROSCOPY   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 60 tablet 3   amLODIPine (NORVASC) 10 MG tablet TAKE ONE TABLET EVERY DAY 90 tablet 1   amoxicillin (AMOXIL) 500 MG capsule 500 mg 4 TABLETS ONE ONE PRIOR TO DENTAL PROCEDURE   benazepril-hydrochlorthiazide (LOTENSIN HCT) 20-12.5 mg tablet Take 1 tablet by mouth once daily 90 tablet 11   celecoxib (CELEBREX) 200 MG capsule TAKE 1 CAPSULE BY MOUTH EVERY DAY 60 capsule 1   cyanocobalamin (VITAMIN B12) 1000 MCG tablet Take by mouth   glimepiride (AMARYL) 1 MG tablet TAKE ONE TABLET EVERY MORNING WITH BREAKFAST 90 tablet 1   metFORMIN (GLUCOPHAGE-XR) 500 MG XR tablet TAKE TWO TABLETS EVERY DAY WITH DINNER 180 tablet 3   naphazoline-pheniramine (EYE ALLERGY RELIEF) 0.025-0.3 % ophthalmic solution Place 2 drops into both eyes as needed   pravastatin (PRAVACHOL) 10  MG tablet TAKE ONE TABLET AT BEDTIME 90 tablet 11   RYBELSUS 14 mg tablet TAKE ONE TABLET EVERY DAY **DO NOT CUT CRUSH OR CHEW 90 tablet 1   sertraline (ZOLOFT) 50 MG tablet TAKE ONE TABLET EVERY DAY 90 tablet 1   tamsulosin (FLOMAX) 0.4 mg capsule 0.4 mg once daily   zinc gluconate 50 mg tablet Take 50 mg by mouth once daily    acetaminophen (TYLENOL) 500 MG tablet Take 1,000 mg by mouth 2 (two) times daily as needed for Pain   aspirin 81 MG EC tablet Take 81 mg by mouth once daily. (Patient not taking: Reported on 12/29/2020)   naphazoline-glycerin 0.012-0.25 % Drop Apply to eye   ZINC ORAL Take 1 tablet by mouth once daily   No current facility-administered medications for this visit.   Allergies: No Known Allergies  Social History:  Social History   Socioeconomic History   Marital status: Married  Spouse name: Dorian Pod   Number of children: 3   Years of education: 40  Occupational History   Occupation: Retired- Animal nutritionist  Tobacco Use   Smoking status: Never Smoker   Smokeless tobacco: Never Used  Scientific laboratory technician Use: Never used  Substance and Sexual Activity   Alcohol use: Yes  Alcohol/week: 3.0 standard drinks  Types: 3 Standard drinks or equivalent per week  Comment: Moderate   Drug use: No   Sexual activity: Defer  Partners: Female   Family History:  Family History  Problem Relation Age of Onset   Diabetes type II Mother   Hip fracture Mother   Review of Systems:   A 10+ ROS was performed, reviewed, and the pertinent orthopaedic findings are documented in the HPI.   Physical Examination:   BP (!) 150/90 (BP Location: Left upper arm, Patient Position: Sitting, BP Cuff Size: Large Adult)  Ht 179.1 cm (5' 10.5")  Wt (!) 130.5 kg (287 lb 12.8 oz)  BMI 40.71 kg/m   Patient is a well-developed, well-nourished male in no acute distress. Patient has normal mood and affect. Patient is alert and oriented to person, place, and time.   HEENT: Atraumatic, normocephalic. Pupils equal and reactive to light. Extraocular motion intact. Noninjected sclera.  Cardiovascular: Regular rate and rhythm, with no murmurs, rubs, or gallops. Distal pulses palpable. No bruits.  Respiratory: Lungs clear to auscultation bilaterally.   Right Hip: Pelvic tilt: Negative Limb lengths:  Equal with the patient standing Soft tissue swelling: Negative Erythema: Negative Crepitance: Negative Tenderness: Greater trochanter is nontender to palpation. Moderate pain is elicited by axial compression or extremes of rotation. Atrophy: No atrophy. Good hip flexor and abductor strength. Range of Motion: EXT/FLEX: 0/0/90 ADD/ABD: 20/0/20 IR/ER: 15/0/30 Sensation is intact over the saphenous, lateral cutaneous, superficial fibular, and deep fibular nerve distributions.  Tests Performed/Reviewed:  X-rays  Anteroposterior view of the pelvis as well as anteroposterior and lateral views of the right hip were obtained. Images reveal severe loss of femoroacetabular joint space with significant osteophyte formation. No fractures or osseous abnormality noted.   I personally ordered and interpreted today's x-rays.  Impression:   ICD-10-CM  1. Primary osteoarthritis of right hip M16.11   Plan:   The patient has end-stage degenerative changes of the right hip. It was explained to the patient that the condition is progressive in nature. Having failed conservative treatment, the patient has elected to proceed with a total joint arthroplasty. The patient will undergo a total joint arthroplasty with Dr. Marry Guan.  The risks of surgery, including blood clot and infection, were discussed with the patient. Measures to reduce these risks, including the use of anticoagulation, perioperative antibiotics, and early ambulation were discussed. The importance of postoperative physical therapy was discussed with the patient. The patient elects to proceed with surgery. The patient is instructed to stop all blood thinners prior to surgery. The patient is instructed to call the hospital the day before surgery to learn of the proper arrival time. Discussed with the patient that moving forward with surgery is pending his upcoming cardiac evaluation and subsequent cardiac clearance.  Contact our office with any questions  or concerns. Follow up as indicated, or sooner should any new problems arise, if conditions worsen, or if they are otherwise concerned.   Gwenlyn Fudge, PA-C [facility] [place], [place] [address] Phone: [phone]  This note was generated in part with voice recognition software and I apologize for any typographical errors that were not detected and corrected.  Electronically signed by Gwenlyn Fudge, PA at 12/29/2020 1:13 PM EDT

## 2021-01-10 ENCOUNTER — Inpatient Hospital Stay
Admission: RE | Admit: 2021-01-10 | Discharge: 2021-01-11 | DRG: 470 | Disposition: A | Discharge status: Home Health | Payer: Medicare Other | Attending: Orthopedic Surgery | Admitting: Orthopedic Surgery

## 2021-01-10 ENCOUNTER — Encounter: Payer: Self-pay | Admitting: Orthopedic Surgery

## 2021-01-10 ENCOUNTER — Encounter
Admission: RE | Disposition: A | Discharge status: Home / Self Care | Payer: Self-pay | Source: Home / Self Care | Attending: Orthopedic Surgery

## 2021-01-10 ENCOUNTER — Inpatient Hospital Stay: Payer: Medicare Other | Admitting: Urgent Care

## 2021-01-10 ENCOUNTER — Other Ambulatory Visit: Payer: Self-pay

## 2021-01-10 ENCOUNTER — Inpatient Hospital Stay: Payer: Medicare Other

## 2021-01-10 DIAGNOSIS — E538 Deficiency of other specified B group vitamins: Secondary | ICD-10-CM | POA: Diagnosis present

## 2021-01-10 DIAGNOSIS — Z79899 Other long term (current) drug therapy: Secondary | ICD-10-CM

## 2021-01-10 DIAGNOSIS — G4733 Obstructive sleep apnea (adult) (pediatric): Secondary | ICD-10-CM | POA: Diagnosis present

## 2021-01-10 DIAGNOSIS — E785 Hyperlipidemia, unspecified: Secondary | ICD-10-CM | POA: Diagnosis present

## 2021-01-10 DIAGNOSIS — E611 Iron deficiency: Secondary | ICD-10-CM | POA: Diagnosis present

## 2021-01-10 DIAGNOSIS — Z20822 Contact with and (suspected) exposure to covid-19: Secondary | ICD-10-CM | POA: Diagnosis present

## 2021-01-10 DIAGNOSIS — Z7982 Long term (current) use of aspirin: Secondary | ICD-10-CM

## 2021-01-10 DIAGNOSIS — Z7984 Long term (current) use of oral hypoglycemic drugs: Secondary | ICD-10-CM | POA: Diagnosis not present

## 2021-01-10 DIAGNOSIS — N400 Enlarged prostate without lower urinary tract symptoms: Secondary | ICD-10-CM | POA: Diagnosis present

## 2021-01-10 DIAGNOSIS — E119 Type 2 diabetes mellitus without complications: Secondary | ICD-10-CM | POA: Diagnosis present

## 2021-01-10 DIAGNOSIS — E7800 Pure hypercholesterolemia, unspecified: Secondary | ICD-10-CM | POA: Diagnosis present

## 2021-01-10 DIAGNOSIS — F419 Anxiety disorder, unspecified: Secondary | ICD-10-CM | POA: Diagnosis present

## 2021-01-10 DIAGNOSIS — I10 Essential (primary) hypertension: Secondary | ICD-10-CM | POA: Diagnosis present

## 2021-01-10 DIAGNOSIS — Z6841 Body Mass Index (BMI) 40.0 and over, adult: Secondary | ICD-10-CM

## 2021-01-10 DIAGNOSIS — E669 Obesity, unspecified: Secondary | ICD-10-CM | POA: Diagnosis present

## 2021-01-10 DIAGNOSIS — M1611 Unilateral primary osteoarthritis, right hip: Principal | ICD-10-CM | POA: Diagnosis present

## 2021-01-10 DIAGNOSIS — Z96642 Presence of left artificial hip joint: Secondary | ICD-10-CM | POA: Diagnosis present

## 2021-01-10 DIAGNOSIS — Z833 Family history of diabetes mellitus: Secondary | ICD-10-CM

## 2021-01-10 DIAGNOSIS — Z96649 Presence of unspecified artificial hip joint: Secondary | ICD-10-CM

## 2021-01-10 DIAGNOSIS — Z96653 Presence of artificial knee joint, bilateral: Secondary | ICD-10-CM | POA: Diagnosis present

## 2021-01-10 DIAGNOSIS — Z96641 Presence of right artificial hip joint: Secondary | ICD-10-CM

## 2021-01-10 HISTORY — DX: Obstructive sleep apnea (adult) (pediatric): G47.33

## 2021-01-10 HISTORY — PX: TOTAL HIP ARTHROPLASTY: SHX124

## 2021-01-10 HISTORY — DX: Anemia, unspecified: D64.9

## 2021-01-10 LAB — GLUCOSE, CAPILLARY
Glucose-Capillary: 188 mg/dL — ABNORMAL HIGH (ref 70–99)
Glucose-Capillary: 294 mg/dL — ABNORMAL HIGH (ref 70–99)
Glucose-Capillary: 356 mg/dL — ABNORMAL HIGH (ref 70–99)
Glucose-Capillary: 270 mg/dL — ABNORMAL HIGH (ref 70–99)

## 2021-01-10 SURGERY — ARTHROPLASTY, HIP, TOTAL,POSTERIOR APPROACH
Anesthesia: Spinal | Site: Hip | Laterality: Right

## 2021-01-10 MED ORDER — MIDAZOLAM HCL 5 MG/5ML IJ SOLN
INTRAMUSCULAR | Status: DC | PRN
  Administered 2021-01-10: 2 mg via INTRAVENOUS

## 2021-01-10 MED ORDER — METFORMIN HCL ER 500 MG PO TB24
1000.0000 mg | ORAL_TABLET | Freq: Every day | ORAL | Status: DC
  Administered 2021-01-10 – 2021-01-11 (×2): 1000 mg via ORAL
  Filled 2021-01-10 (×2): qty 2

## 2021-01-10 MED ORDER — CEFAZOLIN SODIUM-DEXTROSE 2-4 GM/100ML-% IV SOLN
2.0000 g | Freq: Four times a day (QID) | INTRAVENOUS | Status: AC
  Administered 2021-01-10 (×2): 2 g via INTRAVENOUS
  Filled 2021-01-10 (×3): qty 100

## 2021-01-10 MED ORDER — 0.9 % SODIUM CHLORIDE (POUR BTL) OPTIME
TOPICAL | Status: DC | PRN
  Administered 2021-01-10: 60 mL

## 2021-01-10 MED ORDER — PHENOL 1.4 % MT LIQD
1.0000 | OROMUCOSAL | Status: DC | PRN
  Filled 2021-01-10: qty 177

## 2021-01-10 MED ORDER — SENNOSIDES-DOCUSATE SODIUM 8.6-50 MG PO TABS
1.0000 | ORAL_TABLET | Freq: Two times a day (BID) | ORAL | Status: DC
  Administered 2021-01-10 – 2021-01-11 (×2): 1 via ORAL
  Filled 2021-01-10 (×4): qty 1

## 2021-01-10 MED ORDER — SURGIRINSE WOUND IRRIGATION SYSTEM - OPTIME
TOPICAL | Status: DC | PRN
  Administered 2021-01-10: 400 mL

## 2021-01-10 MED ORDER — GLIMEPIRIDE 1 MG PO TABS
1.0000 mg | ORAL_TABLET | Freq: Every day | ORAL | Status: DC
  Administered 2021-01-11: 1 mg via ORAL
  Filled 2021-01-10 (×2): qty 1

## 2021-01-10 MED ORDER — FAMOTIDINE 20 MG PO TABS: 20.0000 mg | ORAL_TABLET | Freq: Once | ORAL | Status: AC

## 2021-01-10 MED ORDER — PRAVASTATIN SODIUM 20 MG PO TABS
10.0000 mg | ORAL_TABLET | Freq: Every day | ORAL | Status: DC
  Administered 2021-01-10 – 2021-01-11 (×2): 10 mg via ORAL
  Filled 2021-01-10 (×3): qty 1

## 2021-01-10 MED ORDER — SODIUM CHLORIDE 0.9 % IV SOLN
INTRAVENOUS | Status: DC | PRN
  Administered 2021-01-10: 70 ug/min via INTRAVENOUS
  Administered 2021-01-10: 50 ug/min via INTRAVENOUS

## 2021-01-10 MED ORDER — TRANEXAMIC ACID-NACL 1000-0.7 MG/100ML-% IV SOLN
1000.0000 mg | INTRAVENOUS | Status: AC
  Administered 2021-01-10: 1000 mg via INTRAVENOUS

## 2021-01-10 MED ORDER — ONDANSETRON HCL 4 MG/2ML IJ SOLN: 4.0000 mg | Freq: Four times a day (QID) | INTRAMUSCULAR | Status: DC | PRN

## 2021-01-10 MED ORDER — VITAMIN B-12 1000 MCG PO TABS
1000.0000 ug | ORAL_TABLET | Freq: Every day | ORAL | Status: DC
  Administered 2021-01-11: 1000 ug via ORAL
  Filled 2021-01-10: qty 1

## 2021-01-10 MED ORDER — ACETAMINOPHEN 10 MG/ML IV SOLN
INTRAVENOUS | Status: DC | PRN
  Administered 2021-01-10: 1000 mg via INTRAVENOUS

## 2021-01-10 MED ORDER — BISACODYL 10 MG RE SUPP: 10.0000 mg | Freq: Every day | RECTAL | Status: DC | PRN

## 2021-01-10 MED ORDER — ORAL CARE MOUTH RINSE: 15.0000 mL | Freq: Once | OROMUCOSAL | Status: AC

## 2021-01-10 MED ORDER — MIDAZOLAM HCL 2 MG/2ML IJ SOLN
INTRAMUSCULAR | Status: AC
  Filled 2021-01-10: qty 2

## 2021-01-10 MED ORDER — TRANEXAMIC ACID-NACL 1000-0.7 MG/100ML-% IV SOLN
INTRAVENOUS | Status: AC
  Filled 2021-01-10: qty 100

## 2021-01-10 MED ORDER — FENTANYL CITRATE (PF) 100 MCG/2ML IJ SOLN
25.0000 ug | INTRAMUSCULAR | Status: DC | PRN
  Administered 2021-01-10: 50 ug via INTRAVENOUS

## 2021-01-10 MED ORDER — OXYCODONE HCL 5 MG PO TABS: 5.0000 mg | ORAL_TABLET | ORAL | Status: DC | PRN

## 2021-01-10 MED ORDER — ACETAMINOPHEN 10 MG/ML IV SOLN
1000.0000 mg | Freq: Four times a day (QID) | INTRAVENOUS | Status: AC
  Administered 2021-01-10 – 2021-01-11 (×4): 1000 mg via INTRAVENOUS
  Filled 2021-01-10 (×5): qty 100

## 2021-01-10 MED ORDER — FLEET ENEMA 7-19 GM/118ML RE ENEM: 1.0000 | ENEMA | Freq: Once | RECTAL | Status: DC | PRN

## 2021-01-10 MED ORDER — ACETAMINOPHEN 325 MG PO TABS
325.0000 mg | ORAL_TABLET | Freq: Four times a day (QID) | ORAL | Status: DC | PRN
  Administered 2021-01-11: 650 mg via ORAL
  Filled 2021-01-10: qty 2

## 2021-01-10 MED ORDER — PHENYLEPHRINE HCL (PRESSORS) 10 MG/ML IV SOLN
INTRAVENOUS | Status: AC
  Filled 2021-01-10: qty 1

## 2021-01-10 MED ORDER — CELECOXIB 200 MG PO CAPS
ORAL_CAPSULE | ORAL | Status: AC
  Administered 2021-01-10: 400 mg via ORAL
  Filled 2021-01-10: qty 2

## 2021-01-10 MED ORDER — FAMOTIDINE 20 MG PO TABS
ORAL_TABLET | ORAL | Status: AC
  Administered 2021-01-10: 20 mg via ORAL
  Filled 2021-01-10: qty 1

## 2021-01-10 MED ORDER — MAGNESIUM HYDROXIDE 400 MG/5ML PO SUSP
30.0000 mL | Freq: Every day | ORAL | Status: DC
  Administered 2021-01-10 – 2021-01-11 (×2): 30 mL via ORAL
  Filled 2021-01-10 (×3): qty 30

## 2021-01-10 MED ORDER — PROPOFOL 1000 MG/100ML IV EMUL
INTRAVENOUS | Status: AC
  Filled 2021-01-10: qty 100

## 2021-01-10 MED ORDER — DIPHENHYDRAMINE HCL 12.5 MG/5ML PO ELIX: 12.5000 mg | ORAL_SOLUTION | ORAL | Status: DC | PRN

## 2021-01-10 MED ORDER — NEOMYCIN-POLYMYXIN B GU 40-200000 IR SOLN
Status: DC | PRN
  Administered 2021-01-10: 14 mL

## 2021-01-10 MED ORDER — GABAPENTIN 300 MG PO CAPS
ORAL_CAPSULE | ORAL | Status: AC
  Administered 2021-01-10: 300 mg via ORAL
  Filled 2021-01-10: qty 1

## 2021-01-10 MED ORDER — PANTOPRAZOLE SODIUM 40 MG PO TBEC
40.0000 mg | DELAYED_RELEASE_TABLET | Freq: Two times a day (BID) | ORAL | Status: DC
  Administered 2021-01-10 – 2021-01-11 (×2): 40 mg via ORAL
  Filled 2021-01-10 (×3): qty 1

## 2021-01-10 MED ORDER — SERTRALINE HCL 50 MG PO TABS
50.0000 mg | ORAL_TABLET | ORAL | Status: DC
  Administered 2021-01-11: 50 mg via ORAL
  Filled 2021-01-10: qty 1

## 2021-01-10 MED ORDER — FERROUS SULFATE 325 (65 FE) MG PO TABS
325.0000 mg | ORAL_TABLET | Freq: Two times a day (BID) | ORAL | Status: DC
  Administered 2021-01-10 – 2021-01-11 (×3): 325 mg via ORAL
  Filled 2021-01-10 (×3): qty 1

## 2021-01-10 MED ORDER — HYDROCHLOROTHIAZIDE 12.5 MG PO CAPS
12.5000 mg | ORAL_CAPSULE | Freq: Every evening | ORAL | Status: DC
  Administered 2021-01-10 – 2021-01-11 (×2): 12.5 mg via ORAL
  Filled 2021-01-10 (×2): qty 1

## 2021-01-10 MED ORDER — DEXAMETHASONE SODIUM PHOSPHATE 10 MG/ML IJ SOLN: 8.0000 mg | Freq: Once | INTRAMUSCULAR | Status: AC

## 2021-01-10 MED ORDER — BENAZEPRIL HCL 20 MG PO TABS
20.0000 mg | ORAL_TABLET | Freq: Every evening | ORAL | Status: DC
  Administered 2021-01-10 – 2021-01-11 (×2): 20 mg via ORAL
  Filled 2021-01-10 (×2): qty 1

## 2021-01-10 MED ORDER — EPHEDRINE SULFATE 50 MG/ML IJ SOLN
INTRAMUSCULAR | Status: DC | PRN
  Administered 2021-01-10 (×2): 10 mg via INTRAVENOUS

## 2021-01-10 MED ORDER — BUPIVACAINE HCL (PF) 0.5 % IJ SOLN
INTRAMUSCULAR | Status: DC | PRN
  Administered 2021-01-10: 3 mL

## 2021-01-10 MED ORDER — FENTANYL CITRATE (PF) 100 MCG/2ML IJ SOLN
INTRAMUSCULAR | Status: AC
  Administered 2021-01-10: 25 ug via INTRAVENOUS
  Filled 2021-01-10: qty 2

## 2021-01-10 MED ORDER — SEMAGLUTIDE 14 MG PO TABS: 14.0000 mg | ORAL_TABLET | ORAL | Status: DC

## 2021-01-10 MED ORDER — CELECOXIB 200 MG PO CAPS: 400.0000 mg | ORAL_CAPSULE | Freq: Once | ORAL | Status: AC

## 2021-01-10 MED ORDER — TAMSULOSIN HCL 0.4 MG PO CAPS
0.8000 mg | ORAL_CAPSULE | Freq: Every day | ORAL | Status: DC
  Administered 2021-01-11: 0.8 mg via ORAL
  Filled 2021-01-10: qty 2

## 2021-01-10 MED ORDER — TRANEXAMIC ACID-NACL 1000-0.7 MG/100ML-% IV SOLN
INTRAVENOUS | Status: AC
  Administered 2021-01-10: 1000 mg via INTRAVENOUS
  Filled 2021-01-10: qty 100

## 2021-01-10 MED ORDER — BENAZEPRIL-HYDROCHLOROTHIAZIDE 20-12.5 MG PO TABS: 1.0000 | ORAL_TABLET | Freq: Every evening | ORAL | Status: DC

## 2021-01-10 MED ORDER — SODIUM CHLORIDE 0.9 % IR SOLN
Status: DC | PRN
  Administered 2021-01-10: 3000 mL

## 2021-01-10 MED ORDER — GABAPENTIN 300 MG PO CAPS: 300.0000 mg | ORAL_CAPSULE | Freq: Once | ORAL | Status: AC

## 2021-01-10 MED ORDER — DEXAMETHASONE SODIUM PHOSPHATE 10 MG/ML IJ SOLN
INTRAMUSCULAR | Status: AC
  Administered 2021-01-10: 8 mg via INTRAVENOUS
  Filled 2021-01-10: qty 1

## 2021-01-10 MED ORDER — FENTANYL CITRATE (PF) 100 MCG/2ML IJ SOLN
INTRAMUSCULAR | Status: AC
  Administered 2021-01-10: 50 ug via INTRAVENOUS
  Filled 2021-01-10: qty 2

## 2021-01-10 MED ORDER — CELECOXIB 200 MG PO CAPS
200.0000 mg | ORAL_CAPSULE | Freq: Two times a day (BID) | ORAL | Status: DC
  Administered 2021-01-10 – 2021-01-11 (×2): 200 mg via ORAL
  Filled 2021-01-10 (×2): qty 1

## 2021-01-10 MED ORDER — METOCLOPRAMIDE HCL 10 MG PO TABS
10.0000 mg | ORAL_TABLET | Freq: Three times a day (TID) | ORAL | Status: DC
  Administered 2021-01-10 – 2021-01-11 (×5): 10 mg via ORAL
  Filled 2021-01-10 (×5): qty 1

## 2021-01-10 MED ORDER — INSULIN ASPART 100 UNIT/ML IJ SOLN
0.0000 [IU] | Freq: Three times a day (TID) | INTRAMUSCULAR | Status: DC
  Administered 2021-01-10: 15 [IU] via SUBCUTANEOUS
  Administered 2021-01-11 (×2): 3 [IU] via SUBCUTANEOUS
  Administered 2021-01-11: 5 [IU] via SUBCUTANEOUS
  Filled 2021-01-10 (×4): qty 1

## 2021-01-10 MED ORDER — OXYCODONE HCL 5 MG PO TABS: 5.0000 mg | ORAL_TABLET | Freq: Once | ORAL | Status: DC | PRN

## 2021-01-10 MED ORDER — TRAMADOL HCL 50 MG PO TABS
50.0000 mg | ORAL_TABLET | ORAL | Status: DC | PRN
Start: 2021-01-10 — End: 2021-01-11

## 2021-01-10 MED ORDER — CHLORHEXIDINE GLUCONATE 0.12 % MT SOLN: 15.0000 mL | Freq: Once | OROMUCOSAL | Status: AC

## 2021-01-10 MED ORDER — SODIUM CHLORIDE 0.9 % IV SOLN: INTRAVENOUS | Status: DC

## 2021-01-10 MED ORDER — AMLODIPINE BESYLATE 10 MG PO TABS
10.0000 mg | ORAL_TABLET | Freq: Every day | ORAL | Status: DC
  Administered 2021-01-11: 10 mg via ORAL
  Filled 2021-01-10 (×2): qty 1

## 2021-01-10 MED ORDER — ALUM & MAG HYDROXIDE-SIMETH 200-200-20 MG/5ML PO SUSP: 30.0000 mL | ORAL | Status: DC | PRN

## 2021-01-10 MED ORDER — ALPRAZOLAM 0.5 MG PO TABS
1.0000 mg | ORAL_TABLET | Freq: Every day | ORAL | Status: DC
  Administered 2021-01-10: 1 mg via ORAL
  Filled 2021-01-10: qty 2

## 2021-01-10 MED ORDER — CHLORHEXIDINE GLUCONATE 4 % EX LIQD: 60.0000 mL | Freq: Once | CUTANEOUS | Status: DC

## 2021-01-10 MED ORDER — NAPHAZOLINE-GLYCERIN 0.012-0.25 % OP SOLN
1.0000 [drp] | Freq: Four times a day (QID) | OPHTHALMIC | Status: DC | PRN
Start: 2021-01-10 — End: 2021-01-11
  Filled 2021-01-10: qty 15

## 2021-01-10 MED ORDER — TRANEXAMIC ACID-NACL 1000-0.7 MG/100ML-% IV SOLN: 1000.0000 mg | Freq: Once | INTRAVENOUS | Status: AC

## 2021-01-10 MED ORDER — HYDROMORPHONE HCL 1 MG/ML IJ SOLN: 0.5000 mg | INTRAMUSCULAR | Status: DC | PRN

## 2021-01-10 MED ORDER — PROPOFOL 500 MG/50ML IV EMUL
INTRAVENOUS | Status: DC | PRN
  Administered 2021-01-10: 200 ug/kg/min via INTRAVENOUS

## 2021-01-10 MED ORDER — OXYCODONE HCL 5 MG/5ML PO SOLN: 5.0000 mg | Freq: Once | ORAL | Status: DC | PRN

## 2021-01-10 MED ORDER — CEFAZOLIN IN SODIUM CHLORIDE 3-0.9 GM/100ML-% IV SOLN
3.0000 g | INTRAVENOUS | Status: AC
  Administered 2021-01-10: 3 g via INTRAVENOUS
  Filled 2021-01-10: qty 100

## 2021-01-10 MED ORDER — ONDANSETRON HCL 4 MG PO TABS: 4.0000 mg | ORAL_TABLET | Freq: Four times a day (QID) | ORAL | Status: DC | PRN

## 2021-01-10 MED ORDER — CHLORHEXIDINE GLUCONATE 0.12 % MT SOLN
OROMUCOSAL | Status: AC
  Administered 2021-01-10: 15 mL via OROMUCOSAL
  Filled 2021-01-10: qty 15

## 2021-01-10 MED ORDER — MENTHOL 3 MG MT LOZG
1.0000 | LOZENGE | OROMUCOSAL | Status: DC | PRN
  Filled 2021-01-10: qty 9

## 2021-01-10 MED ORDER — INSULIN ASPART 100 UNIT/ML IJ SOLN
0.0000 [IU] | Freq: Every day | INTRAMUSCULAR | Status: DC
  Administered 2021-01-10: 3 [IU] via SUBCUTANEOUS
  Filled 2021-01-10: qty 1

## 2021-01-10 MED ORDER — OXYCODONE HCL 5 MG PO TABS: 10.0000 mg | ORAL_TABLET | ORAL | Status: DC | PRN

## 2021-01-10 MED ORDER — ENOXAPARIN SODIUM 30 MG/0.3ML IJ SOSY
30.0000 mg | PREFILLED_SYRINGE | Freq: Two times a day (BID) | INTRAMUSCULAR | Status: DC
  Administered 2021-01-11: 30 mg via SUBCUTANEOUS
  Filled 2021-01-10: qty 0.3

## 2021-01-10 MED ORDER — VASOPRESSIN 20 UNIT/ML IV SOLN
INTRAVENOUS | Status: DC | PRN
  Administered 2021-01-10 (×6): 1 [IU] via INTRAVENOUS

## 2021-01-10 SURGICAL SUPPLY — 64 items
BLADE DRUM FLTD (BLADE) ×2 IMPLANT
BLADE SAW 90X25X1.19 OSCILLAT (BLADE) ×2 IMPLANT
BNDG COHESIVE 4X5 TAN STRL (GAUZE/BANDAGES/DRESSINGS) ×1 IMPLANT
CANISTER SUCT 1200ML W/VALVE (MISCELLANEOUS) ×1 IMPLANT
CARTRIDGE OIL MAESTRO DRILL (MISCELLANEOUS) ×1 IMPLANT
CUP PINNACLE 100 SERIES 58MM (Hips) ×1 IMPLANT
DIFFUSER DRILL AIR PNEUMATIC (MISCELLANEOUS) ×2 IMPLANT
DRAPE 3/4 80X56 (DRAPES) ×2 IMPLANT
DRAPE INCISE IOBAN 66X60 STRL (DRAPES) ×2 IMPLANT
DRSG DERMACEA 8X12 NADH (GAUZE/BANDAGES/DRESSINGS) ×2 IMPLANT
DRSG MEPILEX SACRM 8.7X9.8 (GAUZE/BANDAGES/DRESSINGS) ×2 IMPLANT
DRSG OPSITE POSTOP 4X12 (GAUZE/BANDAGES/DRESSINGS) ×2 IMPLANT
DRSG OPSITE POSTOP 4X14 (GAUZE/BANDAGES/DRESSINGS) IMPLANT
DRSG TEGADERM 4X4.75 (GAUZE/BANDAGES/DRESSINGS) ×2 IMPLANT
DURAPREP 26ML APPLICATOR (WOUND CARE) ×2 IMPLANT
ELECT CAUTERY BLADE 6.4 (BLADE) ×2 IMPLANT
ELECT REM PT RETURN 9FT ADLT (ELECTROSURGICAL) ×2
ELECTRODE REM PT RTRN 9FT ADLT (ELECTROSURGICAL) ×1 IMPLANT
GAUZE 4X4 16PLY ~~LOC~~+RFID DBL (SPONGE) ×2 IMPLANT
GLOVE SURG ENC MOIS LTX SZ7.5 (GLOVE) ×4 IMPLANT
GLOVE SURG ENC TEXT LTX SZ7.5 (GLOVE) ×4 IMPLANT
GLOVE SURG UNDER LTX SZ8 (GLOVE) ×2 IMPLANT
GLOVE SURG UNDER POLY LF SZ7.5 (GLOVE) ×2 IMPLANT
GOWN STRL REUS W/ TWL LRG LVL3 (GOWN DISPOSABLE) ×2 IMPLANT
GOWN STRL REUS W/ TWL XL LVL3 (GOWN DISPOSABLE) ×1 IMPLANT
GOWN STRL REUS W/TWL LRG LVL3 (GOWN DISPOSABLE) ×4
GOWN STRL REUS W/TWL XL LVL3 (GOWN DISPOSABLE) ×2
HEAD M SROM 36MM 2 (Hips) IMPLANT
HEMOVAC 400CC 10FR (MISCELLANEOUS) ×2 IMPLANT
HOLDER FOLEY CATH W/STRAP (MISCELLANEOUS) ×2 IMPLANT
HOOD PEEL AWAY FLYTE STAYCOOL (MISCELLANEOUS) ×4 IMPLANT
IRRIGATION SURGIPHOR STRL (IV SOLUTION) ×2 IMPLANT
IV NS IRRIG 3000ML ARTHROMATIC (IV SOLUTION) ×2 IMPLANT
KIT PEG BOARD PINK (KITS) ×2 IMPLANT
KIT TURNOVER KIT A (KITS) ×2 IMPLANT
LINER MARATHON 10 DEG 36MM (Hips) ×2 IMPLANT
LINER MARATHON 10D 36MM (Hips) IMPLANT
MANIFOLD NEPTUNE II (INSTRUMENTS) ×4 IMPLANT
NDL SAFETY ECLIPSE 18X1.5 (NEEDLE) ×1 IMPLANT
NEEDLE HYPO 18GX1.5 SHARP (NEEDLE) ×2
NS IRRIG 500ML POUR BTL (IV SOLUTION) ×2 IMPLANT
OIL CARTRIDGE MAESTRO DRILL (MISCELLANEOUS) ×2
PACK HIP PROSTHESIS (MISCELLANEOUS) ×2 IMPLANT
PENCIL SMOKE EVACUATOR COATED (MISCELLANEOUS) ×2 IMPLANT
PIN STEIN THRED 5/32 (Pin) ×2 IMPLANT
PULSAVAC PLUS IRRIG FAN TIP (DISPOSABLE) ×2
SOL PREP PVP 2OZ (MISCELLANEOUS) ×2
SOLUTION PREP PVP 2OZ (MISCELLANEOUS) ×1 IMPLANT
SPONGE DRAIN TRACH 4X4 STRL 2S (GAUZE/BANDAGES/DRESSINGS) ×2 IMPLANT
SPONGE T-LAP 18X18 ~~LOC~~+RFID (SPONGE) ×8 IMPLANT
SROM M HEAD 36MM 2 (Hips) ×2 IMPLANT
STAPLER SKIN PROX 35W (STAPLE) ×2 IMPLANT
STEM FEM CMNTLSS LG AML 13.5 (Hips) ×1 IMPLANT
SUT ETHIBOND #5 BRAIDED 30INL (SUTURE) ×2 IMPLANT
SUT VIC AB 0 CT1 36 (SUTURE) ×2 IMPLANT
SUT VIC AB 1 CT1 36 (SUTURE) ×4 IMPLANT
SUT VIC AB 2-0 CT1 27 (SUTURE) ×2
SUT VIC AB 2-0 CT1 TAPERPNT 27 (SUTURE) ×1 IMPLANT
SYR 20ML LL LF (SYRINGE) ×2 IMPLANT
TAPE CLOTH 3X10 WHT NS LF (GAUZE/BANDAGES/DRESSINGS) ×2 IMPLANT
TAPE TRANSPORE STRL 2 31045 (GAUZE/BANDAGES/DRESSINGS) ×2 IMPLANT
TIP FAN IRRIG PULSAVAC PLUS (DISPOSABLE) ×1 IMPLANT
TOWEL OR 17X26 4PK STRL BLUE (TOWEL DISPOSABLE) ×2 IMPLANT
TRAY FOLEY MTR SLVR 16FR STAT (SET/KITS/TRAYS/PACK) ×2 IMPLANT

## 2021-01-10 NOTE — Evaluation (Signed)
Physical Therapy Evaluation Patient Details Name: [name] [name] MRN: [id number] DOB: [birth year] Today's Date: 01/10/2021   History of Present Illness  Pt is a 75 y.o. 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  PMH includes anxiety, BPH, DM, HOH, htn, prostate disorder, sleep apnea (CPAP), B TKA, L THA, a-fib.  Clinical Impression  Prior to hospital admission, pt was modified independent ambulating with [facility]; lives with his wife on main level of home with steps to enter.  Pt sitting up in bed upon PT arrival; pt attempting to use urinal but reporting unable to in bed and requesting to attempt to urinate out of bed as soon as possible (therapist assisted pt with using urinal in standing).  Currently pt is min assist semi-supine to sitting edge of bed; pt unable to stand from mildly elevated bed height with 1 assist but able to stand x2 trials from significantly elevated bed height (max assist x1 trial; mod assist x1 trial); pt able to stand from recliner with CGA (easier to stand utilizing B armrests); and CGA to ambulate 50 feet with RW.  5/10 R hip pain beginning of session and 1/10 R hip pain end of session at rest.  Increased red drainage noted R hip honeycomb dressing post ambulation: nurse notified.  Reviewed posterior THP's with pt--pt would benefit from continued education.  Pt would benefit from skilled PT to address noted impairments and functional limitations (see below for any additional details).  Upon hospital discharge, pt would benefit from [place].    Follow Up Recommendations Home health PT;Supervision for mobility/OOB    Equipment Recommendations  Rolling walker with 5" wheels;3in1 (PT)    Recommendations for Other Services OT consult     Precautions / Restrictions Precautions Precautions: Posterior Hip;Fall Precaution Booklet Issued: Yes (comment) Precaution Comments: Hemovac Restrictions Weight Bearing Restrictions: Yes RLE Weight Bearing: Weight bearing as  tolerated      Mobility  Bed Mobility Overal bed mobility: Needs Assistance Bed Mobility: Supine to Sit     Supine to sit: Min assist;HOB elevated     General bed mobility comments: use of bed rails; assist for R LE    Transfers Overall transfer level: Needs assistance Equipment used: Rolling walker (2 wheeled) Transfers: Sit to/from Stand Sit to Stand: Max assist;Mod assist;From elevated surface;Min guard         General transfer comment: pt initially unable to stand from mildly elevated bed height with 1 assist so bed height elevated significantly and pt able to stand x1 trial with max assist and next trial with mod assist;  pillows (1 pillow height) placed in recliner and pt able to stand from recliner with CGA  Ambulation/Gait Ambulation/Gait assistance: Min guard Gait Distance (Feet): 50 Feet Assistive device: Rolling walker (2 wheeled) Gait Pattern/deviations: Step-to pattern Gait velocity: decreased   General Gait Details: decreased stance time R LE; initial vc's for walker use and gait technique  Stairs            Wheelchair Mobility    Modified Rankin (Stroke Patients Only)       Balance Overall balance assessment: Needs assistance Sitting-balance support: No upper extremity supported;Feet supported Sitting balance-Leahy Scale: Good Sitting balance - Comments: steady sitting reaching within BOS   Standing balance support: No upper extremity supported Standing balance-Leahy Scale: Fair Standing balance comment: steady standing briefly no UE support  Pertinent Vitals/Pain Pain Assessment: 0-10 Pain Score: 1  Pain Location: R hip Pain Descriptors / Indicators: Sore Pain Intervention(s): Limited activity within patient's tolerance;Monitored during session;Premedicated before session;Repositioned Vitals (HR and O2 on room air) stable and WFL throughout treatment session.    Home Living Family/patient  expects to be discharged to:: Private residence Living Arrangements: Spouse/significant other Available Help at Discharge: Family;Available 24 hours/day Type of Home: House Home Access: Stairs to enter   [employer] of Steps: 2 steps with B railings (can reach both) from front and 1 step with B railing (unable to reach both) from garage Home Layout: One level;Laundry or work area in basement (Does not need to go to basement) Home Equipment: Environmental consultant - 2 wheels;Cane - single point;Bedside commode;Shower seat - built in;Grab bars - toilet;Crutches;Grab bars - tub/shower      Prior Function Level of Independence: Independent with assistive device(s)         Comments: Recently ambulating with SPC d/t hip pain.  H/o falls.     Hand Dominance        Extremity/Trunk Assessment   Upper Extremity Assessment Upper Extremity Assessment: Overall WFL for tasks assessed    Lower Extremity Assessment Lower Extremity Assessment: RLE deficits/detail RLE Deficits / Details: at least 3-/5 hip flexion; at least 3/5 knee extension and DF/PF AROM RLE: Unable to fully assess due to pain RLE Sensation: decreased light touch (mildly decreased compared to L LE)    Cervical / Trunk Assessment Cervical / Trunk Assessment: Normal  Communication   Communication: HOH  Cognition Arousal/Alertness: Awake/alert Behavior During Therapy: WFL for tasks assessed/performed (impulsive at times (complicated d/t HOH)) Overall Cognitive Status: Within Functional Limits for tasks assessed                                        General Comments  Nursing cleared pt for participation in physical therapy.  Pt agreeable to PT session.  Pt's wife present during session.    Exercises     Assessment/Plan    PT Assessment Patient needs continued PT services  PT Problem List Decreased strength;Decreased activity tolerance;Decreased balance;Decreased mobility;Decreased knowledge of use of  DME;Decreased knowledge of precautions;Pain;Impaired sensation;Decreased skin integrity       PT Treatment Interventions DME instruction;Gait training;Stair training;Functional mobility training;Therapeutic activities;Therapeutic exercise;Balance training;Patient/family education    PT Goals (Current goals can be found in the Care Plan section)  Acute Rehab PT Goals Patient Stated Goal: to go home PT Goal Formulation: With patient Time For Goal Achievement: 01/24/21 Potential to Achieve Goals: Good    Frequency BID   Barriers to discharge        Co-evaluation               AM-PAC PT "6 Clicks" Mobility  Outcome Measure Help needed turning from your back to your side while in a flat bed without using bedrails?: A Little Help needed moving from lying on your back to sitting on the side of a flat bed without using bedrails?: A Little Help needed moving to and from a bed to a chair (including a wheelchair)?: A Little Help needed standing up from a chair using your arms (e.g., wheelchair or bedside chair)?: A Little Help needed to walk in hospital room?: A Little Help needed climbing 3-5 steps with a railing? : A Lot 6 Click Score: 17    End of Session Equipment  Utilized During Treatment: Gait belt Activity Tolerance: Patient tolerated treatment well Patient left: in chair;with call bell/phone within reach;with chair alarm set;with family/visitor present;with SCD's reapplied;Other (comment) (B heels floating via pillow support; pillows between pt's knees for posterior THP's) Nurse Communication: Mobility status;Precautions;Weight bearing status;Other (comment) (Increased drainage noted R thigh dressing) PT Visit Diagnosis: Other abnormalities of gait and mobility (R26.89);Muscle weakness (generalized) (M62.81);History of falling (Z91.81);Difficulty in walking, not elsewhere classified (R26.2);Pain Pain - Right/Left: Right Pain - part of body: Hip    Time: 9842-1031 PT Time  Calculation (min) (ACUTE ONLY): 55 min   Charges:   PT Evaluation $PT Eval Low Complexity: 1 Low PT Treatments $Gait Training: 8-22 mins $Therapeutic Activity: 23-37 mins       Leitha Bleak, PT 01/10/21, 4:59 PM

## 2021-01-10 NOTE — Transfer of Care (Signed)
Immediate Anesthesia Transfer of Care Note  Patient: [name] [name]  Procedure(s) Performed: TOTAL HIP ARTHROPLASTY (Right: Hip)  Patient Location: PACU  Anesthesia Type:Spinal  Level of Consciousness: awake, alert  and oriented  Airway & Oxygen Therapy: Patient Spontanous Breathing and Patient connected to face mask oxygen  Post-op Assessment: Report given to RN and Post -op Vital signs reviewed and stable  Post vital signs: Reviewed and stable  Last Vitals:  Vitals Value Taken Time  BP 101/79 01/10/21 1138  Temp    Pulse 69 01/10/21 1140  Resp 18 01/10/21 1140  SpO2 98 % 01/10/21 1140  Vitals shown include unvalidated device data.  Last Pain:  Vitals:   01/10/21 0635  TempSrc: Temporal  PainSc: 4          Complications: No notable events documented.

## 2021-01-10 NOTE — Op Note (Addendum)
OPERATIVE NOTE  DATE OF SURGERY:  01/10/2021  PATIENT NAME:  [name] [name]   DOB: [birth year]  MRN: [id number]  PRE-OPERATIVE DIAGNOSIS: Degenerative arthrosis of the right hip, primary  POST-OPERATIVE DIAGNOSIS:  Same  PROCEDURE:  Right total hip arthroplasty  SURGEON:  Marciano Sequin. M.D.  ASSISTANT: Cassell Smiles, PA-C (present and scrubbed throughout the case, critical for assistance with exposure, retraction, instrumentation, and closure)  ANESTHESIA: spinal  ESTIMATED BLOOD LOSS: 250 mL  FLUIDS REPLACED: 1800 mL of crystalloid  DRAINS: 2 medium Hemovac drains  IMPLANTS UTILIZED: DePuy 13.5 mm large stature AML femoral stem, 58 mm OD Pinnacle 100 acetabular component, neutral Pinnacle Marathon polyethylene insert, and a 36 mm M-SPEC -2 mm hip ball  INDICATIONS FOR SURGERY: [name] [name] is a 75 y.o. year old male with a long history of progressive hip and groin  pain. X-rays demonstrated severe degenerative changes. The patient had not seen any significant improvement despite conservative nonsurgical intervention. After discussion of the risks and benefits of surgical intervention, the patient expressed understanding of the risks benefits and agree with plans for total hip arthroplasty.   The risks, benefits, and alternatives were discussed at length including but not limited to the risks of infection, bleeding, nerve injury, stiffness, blood clots, the need for revision surgery, limb length inequality, dislocation, cardiopulmonary complications, among others, and they were willing to proceed.  PROCEDURE IN DETAIL: The patient was brought into the operating room and, after adequate spinal anesthesia was achieved, the patient was placed in a left lateral decubitus position. Axillary roll was placed and all bony prominences were well-padded. The patient's right hip was cleaned and prepped with alcohol and DuraPrep and draped in the usual sterile fashion. A "timeout" was performed as per  usual protocol. A lateral curvilinear incision was made gently curving towards the posterior superior iliac spine. The IT band was incised in line with the skin incision and the fibers of the gluteus maximus were split in line. The piriformis tendon was identified, skeletonized, and incised at its insertion to the proximal femur and reflected posteriorly. A T type posterior capsulotomy was performed. Prior to dislocation of the femoral head, a threaded Steinmann pin was inserted through a separate stab incision into the pelvis superior to the acetabulum and bent in the form of a stylus so as to assess limb length and hip offset throughout the procedure. The femoral head was then dislocated posteriorly.  The gluteal sling was incised so as to better mobilize the proximal femur.  Inspection of the femoral head demonstrated severe degenerative changes with full-thickness loss of articular cartilage. The femoral neck cut was performed using an oscillating saw. The anterior capsule was elevated off of the femoral neck using a periosteal elevator. Attention was then directed to the acetabulum. The remnant of the labrum was excised using electrocautery. Inspection of the acetabulum also demonstrated significant degenerative changes. The acetabulum was reamed in sequential fashion up to a 57 mm diameter. Good punctate bleeding bone was encountered. A 58 mm Pinnacle 100 acetabular component was positioned and impacted into place. Good scratch fit was appreciated. A neutral polyethylene trial was inserted.  Attention was then directed to the proximal femur. A hole for reaming of the proximal femoral canal was created using a high-speed burr. The femoral canal was reamed in sequential fashion up to a 13 mm diameter. This allowed for approximately 5 cm of scratch fit. Serial broaches were inserted up to a 13.5 mm large stature femoral broach. Calcar  region was planed and a trial reduction was performed using a 36 mm hip ball  with a -2 mm neck length. Good equalization of limb lengths and hip offset was appreciated and excellent stability was noted both anteriorly and posteriorly. Trial components were removed. The acetabular shell was irrigated with copious amounts of normal saline with antibiotic solution and suctioned dry. A neutral Pinnacle Marathon polyethylene insert was positioned and impacted into place. Next, a 13.5 mm large stature AML femoral stem was positioned and impacted into place. Excellent scratch fit was appreciated. A trial reduction was again performed with a 36 mm hip ball with a -2 mm neck length. Again, good equalization of limb lengths was appreciated and excellent stability appreciated both anteriorly and posteriorly. The hip was then dislocated and the trial hip ball was removed. The Morse taper was cleaned and dried. A 36 mm M-SPEC hip ball with a -2 mm neck length was placed on the trunnion and impacted into place. The hip was then reduced and placed through range of motion. Excellent stability was appreciated both anteriorly and posteriorly.  The wound was irrigated with copious amounts of normal saline followed by 500 ml of Surgiphor and suctioned dry. Good hemostasis was appreciated. The posterior capsulotomy was repaired using #5 Ethibond. Piriformis tendon was reapproximated to the undersurface of the gluteus medius tendon using #5 Ethibond.  The gluteal sling was repaired using interrupted sutures of #5 Ethibond.  The IT band was reapproximated using interrupted sutures of #1 Vicryl. Subcutaneous tissue was approximated using first #0 Vicryl followed by #2-0 Vicryl. The skin was closed with skin staples.  The patient tolerated the procedure well and was transported to the recovery room in stable condition.   Marciano Sequin., M.D.

## 2021-01-10 NOTE — Anesthesia Preprocedure Evaluation (Signed)
Anesthesia Evaluation  Patient identified by MRN, date of birth, ID band Patient awake    Reviewed: Allergy & Precautions, NPO status , Patient's Chart, lab work & pertinent test results  History of Anesthesia Complications Negative for: history of anesthetic complications  Airway Mallampati: II  TM Distance: >3 FB Neck ROM: Full    Dental  (+) Missing, Dental Advidsory Given   Pulmonary neg shortness of breath, sleep apnea , neg COPD, neg recent URI,    breath sounds clear to auscultation- rhonchi (-) wheezing      Cardiovascular hypertension, Pt. on medications (-) angina(-) CAD, (-) Past MI, (-) Cardiac Stents and (-) CABG + dysrhythmias (-) Valvular Problems/Murmurs Rhythm:Regular Rate:Normal - Systolic murmurs and - Diastolic murmurs    Neuro/Psych neg Seizures PSYCHIATRIC DISORDERS Anxiety negative neurological ROS     GI/Hepatic negative GI ROS, Neg liver ROS,   Endo/Other  diabetes, Type 2, Oral Hypoglycemic Agents  Renal/GU negative Renal ROS     Musculoskeletal  (+) Arthritis ,   Abdominal (+) + obese,   Peds  Hematology  (+) Blood dyscrasia, anemia ,   Anesthesia Other Findings Past Medical History: No date: Anxiety No date: Arthritis No date: Diabetes mellitus No date: Diverticulosis No date: Hemorrhoids No date: HOH (hard of hearing)     Comment:  HOH No date: Hypertension No date: Normocytic anemia No date: Prostate disorder No date: Sleep apnea     Comment:  no cpap   Reproductive/Obstetrics                             Anesthesia Physical  Anesthesia Plan  ASA: 3  Anesthesia Plan: Spinal   Post-op Pain Management:    Induction: Intravenous  PONV Risk Score and Plan: 1 and Propofol infusion and TIVA  Airway Management Planned: Natural Airway and Simple Face Mask  Additional Equipment:   Intra-op Plan:   Post-operative Plan:   Informed Consent: I  have reviewed the patients History and Physical, chart, labs and discussed the procedure including the risks, benefits and alternatives for the proposed anesthesia with the patient or authorized representative who has indicated his/her understanding and acceptance.     Dental advisory given  Plan Discussed with: CRNA and Anesthesiologist  Anesthesia Plan Comments: (Patient reports no bleeding problems and no anticoagulant use.  Plan for spinal with backup GA  Patient consented for risks of anesthesia including but not limited to:  - adverse reactions to medications - damage to eyes, teeth, lips or other oral mucosa - nerve damage due to positioning  - risk of bleeding, infection and or nerve damage from spinal that could lead to paralysis - risk of headache or failed spinal - damage to teeth, lips or other oral mucosa - sore throat or hoarseness - damage to heart, brain, nerves, lungs, other parts of body or loss of life  Patient voiced understanding.)        Anesthesia Quick Evaluation

## 2021-01-10 NOTE — Anesthesia Procedure Notes (Signed)
Spinal  Patient location during procedure: OR Start time: 01/10/2021 7:25 AM End time: 01/10/2021 7:38 AM Reason for block: surgical anesthesia Staffing Performed: resident/CRNA  Resident/CRNA: Nelda Marseille, CRNA Preanesthetic Checklist Completed: patient identified, IV checked, site marked, risks and benefits discussed, surgical consent, monitors and equipment checked, pre-op evaluation and timeout performed Spinal Block Patient position: sitting Prep: Betadine Patient monitoring: heart rate, continuous pulse ox, blood pressure and cardiac monitor Approach: right paramedian Location: L3-4 Injection technique: single-shot Needle Needle type: Whitacre and Introducer  Needle gauge: 25 G Needle length: 9 cm Assessment Sensory level: T10 Events: CSF return Additional Notes Negative paresthesia. Negative blood return. Positive free-flowing CSF. Expiration date of kit checked and confirmed. Patient tolerated procedure well, without complications.

## 2021-01-10 NOTE — H&P (Signed)
The patient has been re-examined, and the chart reviewed, and there have been no interval changes to the documented history and physical.    The risks, benefits, and alternatives have been discussed at length. The patient expressed understanding of the risks benefits and agreed with plans for surgical intervention.  Jostin Rue P. Alyssandra Hulsebus, Jr. M.D.    

## 2021-01-11 ENCOUNTER — Encounter: Payer: Self-pay | Admitting: Orthopedic Surgery

## 2021-01-11 LAB — SURGICAL PATHOLOGY

## 2021-01-11 LAB — GLUCOSE, CAPILLARY
Glucose-Capillary: 200 mg/dL — ABNORMAL HIGH (ref 70–99)
Glucose-Capillary: 230 mg/dL — ABNORMAL HIGH (ref 70–99)
Glucose-Capillary: 154 mg/dL — ABNORMAL HIGH (ref 70–99)

## 2021-01-11 MED ORDER — TRAMADOL HCL 50 MG PO TABS: 50.0000 mg | ORAL_TABLET | ORAL | 0 refills | Status: DC | PRN

## 2021-01-11 MED ORDER — CELECOXIB 200 MG PO CAPS: 200.0000 mg | ORAL_CAPSULE | Freq: Two times a day (BID) | ORAL | 0 refills | Status: DC

## 2021-01-11 MED ORDER — ENOXAPARIN SODIUM 40 MG/0.4ML IJ SOSY: 40.0000 mg | PREFILLED_SYRINGE | INTRAMUSCULAR | 0 refills | Status: DC

## 2021-01-11 NOTE — Plan of Care (Signed)

## 2021-01-11 NOTE — Plan of Care (Signed)
Hemovac removed. and Mini compression dressing applied.   

## 2021-01-11 NOTE — Progress Notes (Signed)
Physical Therapy Treatment Patient Details Name: [name] [name] MRN: [id number] DOB: 1946/04/11 Today's Date: 01/11/2021    History of Present Illness Pt is a 75 y.o. 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  PMH includes anxiety, BPH, DM, HOH, htn, prostate disorder, sleep apnea (CPAP), B TKA, L THA, a-fib.    PT Comments    Patient received in recliner, he is agreeable to PT session. Patient is mod independent with sit to stand from recliner. Ambulated into bathroom and then around nursing station with RW and min guard/supervision. Cues for upright posture. Patient ambulated up/down 4 steps with B rails and min guard. Demonstrates good safety and technique with steps. Performed bed level exercises at end of session and had good recall of posterior hip precautions. Patient will continue to benefit from skilled PT while here to continue working on strength and functional mobility.        Follow Up Recommendations  Home health PT;Supervision - Intermittent     Equipment Recommendations  3in1 (PT)    Recommendations for Other Services       Precautions / Restrictions Precautions Precautions: Fall;Posterior Hip Precaution Comments: Hemovac Restrictions Weight Bearing Restrictions: Yes RLE Weight Bearing: Weight bearing as tolerated    Mobility  Bed Mobility Overal bed mobility: Needs Assistance Bed Mobility: Sit to Supine     Supine to sit: Modified independent (Device/Increase time) Sit to supine: Min guard   General bed mobility comments: use of bed rails; min support for R LE    Transfers Overall transfer level: Needs assistance Equipment used: Rolling walker (2 wheeled) Transfers: Sit to/from Stand Sit to Stand: Supervision         General transfer comment: supervision from recliner  Ambulation/Gait Ambulation/Gait assistance: Supervision;Min guard Gait Distance (Feet): 175 Feet Assistive device: Rolling walker (2 wheeled) Gait  Pattern/deviations: Step-through pattern;Decreased step length - right;Decreased step length - left Gait velocity: decreased   General Gait Details: safe ambulation with RW, min guard/supervision   Stairs Stairs: Yes Stairs assistance: Min guard;Supervision Stair Management: Two rails Number of Stairs: 4 General stair comments: patient demonstrates safe technique on steps.   Wheelchair Mobility    Modified Rankin (Stroke Patients Only)       Balance Overall balance assessment: Modified Independent Sitting-balance support: Feet supported Sitting balance-Leahy Scale: Normal Sitting balance - Comments: good balance, able to reach beyond BOS   Standing balance support: Bilateral upper extremity supported;During functional activity Standing balance-Leahy Scale: Good Standing balance comment: steady standing briefly no UE support                            Cognition Arousal/Alertness: Awake/alert Behavior During Therapy: WFL for tasks assessed/performed Overall Cognitive Status: Within Functional Limits for tasks assessed                                        Exercises Total Joint Exercises Ankle Circles/Pumps: AROM;10 reps;Both Quad Sets: AROM;Both;10 reps Gluteal Sets: AROM;10 reps;Both Heel Slides: AROM;Right;10 reps Hip ABduction/ADduction: AROM;Right;10 reps Straight Leg Raises: AROM;Right;10 reps Long Arc Quad: AROM;Both;10 reps Other Exercises Other Exercises: educ re: hip precautions, AE for LE dressing/bathing, routines modifications    General Comments        Pertinent Vitals/Pain Pain Assessment: Faces Pain Score: 0-No pain Faces Pain Scale: Hurts a little bit Pain Location: R hip Pain  Descriptors / Indicators: Discomfort;Sore Pain Intervention(s): Monitored during session;Repositioned    Home Living Family/patient expects to be discharged to:: Private residence Living Arrangements: Spouse/significant other Available  Help at Discharge: Family;Available 24 hours/day Type of Home: House Home Access: Stairs to enter Entrance Stairs-Rails: Right;Left;Can reach both Home Layout: One level;Laundry or work area in basement;Able to live on main level with bedroom/bathroom Home Equipment: Environmental consultant - 2 wheels;Cane - single point;Bedside commode;Shower seat - built in;Grab bars - toilet;Crutches;Grab bars - tub/shower      Prior Function Level of Independence: Independent with assistive device(s)      Comments: Recently ambulating with SPC d/t hip pain.  Denies falls.   PT Goals (current goals can now be found in the care plan section) Acute Rehab PT Goals Patient Stated Goal: to hopefully go home today PT Goal Formulation: With patient Time For Goal Achievement: 01/24/21 Potential to Achieve Goals: Good Progress towards PT goals: Progressing toward goals    Frequency    BID      PT Plan Current plan remains appropriate    Co-evaluation              AM-PAC PT "6 Clicks" Mobility   Outcome Measure  Help needed turning from your back to your side while in a flat bed without using bedrails?: A Little Help needed moving from lying on your back to sitting on the side of a flat bed without using bedrails?: A Little Help needed moving to and from a bed to a chair (including a wheelchair)?: A Little Help needed standing up from a chair using your arms (e.g., wheelchair or bedside chair)?: A Little Help needed to walk in hospital room?: A Little Help needed climbing 3-5 steps with a railing? : A Little 6 Click Score: 18    End of Session Equipment Utilized During Treatment: Gait belt Activity Tolerance: Patient tolerated treatment well Patient left: in bed;with call bell/phone within reach;with bed alarm set;with family/visitor present Nurse Communication: Mobility status PT Visit Diagnosis: Muscle weakness (generalized) (M62.81);Difficulty in walking, not elsewhere classified (R26.2);Pain Pain -  Right/Left: Right Pain - part of body: Hip     Time: 5176-1607 PT Time Calculation (min) (ACUTE ONLY): 19 min  Charges:  $Gait Training: 8-22 mins $Therapeutic Exercise: 8-22 mins                     Rien Marland, PT, GCS 01/11/21,1:47 PM

## 2021-01-11 NOTE — Progress Notes (Signed)
Physical Therapy Treatment Patient Details Name: [name] [name] MRN: [id number] DOB: July 07, 1945 Today's Date: 01/11/2021    History of Present Illness Pt is a 75 y.o. 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  PMH includes anxiety, BPH, DM, HOH, htn, prostate disorder, sleep apnea (CPAP), B TKA, L THA, a-fib.    PT Comments    Patient received in bed, he is ready to get up. Patient reports he is feeling better today. Performed bed mobility with mod independence. Transfers with min guard from slightly elevated bed. Patient ambulated 150 feet with RW and min guard. He will continue to benefit from skilled PT while here to improve functional independence and safety to return home.     Follow Up Recommendations  Home health PT;Supervision for mobility/OOB     Equipment Recommendations  3in1 (PT)    Recommendations for Other Services       Precautions / Restrictions Precautions Precautions: Posterior Hip;Fall Precaution Comments: Hemovac Restrictions Weight Bearing Restrictions: Yes RLE Weight Bearing: Weight bearing as tolerated    Mobility  Bed Mobility Overal bed mobility: Modified Independent Bed Mobility: Supine to Sit     Supine to sit: Modified independent (Device/Increase time)          Transfers Overall transfer level: Needs assistance Equipment used: Rolling walker (2 wheeled) Transfers: Sit to/from Stand Sit to Stand: Min guard         General transfer comment: min bed elevation, min guard  Ambulation/Gait Ambulation/Gait assistance: Min guard Gait Distance (Feet): 150 Feet Assistive device: Rolling walker (2 wheeled) Gait Pattern/deviations: Step-through pattern Gait velocity: decreased   General Gait Details: safe ambulation with RW, min guard   Stairs             Wheelchair Mobility    Modified Rankin (Stroke Patients Only)       Balance Overall balance assessment: Needs assistance Sitting-balance support: Feet  supported Sitting balance-Leahy Scale: Good Sitting balance - Comments: good balance, able to reach beyond BOS   Standing balance support: Bilateral upper extremity supported;During functional activity Standing balance-Leahy Scale: Good Standing balance comment: steady standing briefly no UE support                            Cognition Arousal/Alertness: Awake/alert Behavior During Therapy: WFL for tasks assessed/performed Overall Cognitive Status: Within Functional Limits for tasks assessed                                        Exercises Total Joint Exercises Ankle Circles/Pumps: AROM;Both;10 reps Straight Leg Raises: AROM;Both;10 reps Long Arc Quad: AROM;Both;10 reps Other Exercises Other Exercises: educ re: hip precautions, AE for LE dressing/bathing, routines modifications    General Comments        Pertinent Vitals/Pain Pain Assessment: Faces Pain Score: 0-No pain Faces Pain Scale: Hurts a little bit Pain Location: R hip Pain Descriptors / Indicators: Discomfort;Sore Pain Intervention(s): Monitored during session;Repositioned    Home Living Family/patient expects to be discharged to:: Private residence Living Arrangements: Spouse/significant other Available Help at Discharge: Family;Available 24 hours/day Type of Home: House Home Access: Stairs to enter Entrance Stairs-Rails: Right;Left;Can reach both Home Layout: One level;Laundry or work area in basement;Able to live on main level with bedroom/bathroom Home Equipment: Environmental consultant - 2 wheels;Cane - single point;Bedside commode;Shower seat - built in;Grab bars - toilet;Crutches;Grab bars -  tub/shower      Prior Function Level of Independence: Independent with assistive device(s)      Comments: Recently ambulating with SPC d/t hip pain.  Denies falls.   PT Goals (current goals can now be found in the care plan section) Acute Rehab PT Goals Patient Stated Goal: [place] w/in 6 wks PT Goal Formulation: With patient Time For Goal Achievement: 01/24/21 Potential to Achieve Goals: Fair Progress towards PT goals: Progressing toward goals    Frequency    BID      PT Plan Current plan remains appropriate    Co-evaluation              AM-PAC PT "6 Clicks" Mobility   Outcome Measure  Help needed turning from your back to your side while in a flat bed without using bedrails?: A Little Help needed moving from lying on your back to sitting on the side of a flat bed without using bedrails?: A Little Help needed moving to and from a bed to a chair (including a wheelchair)?: A Little Help needed standing up from a chair using your arms (e.g., wheelchair or bedside chair)?: A Little Help needed to walk in hospital room?: A Little Help needed climbing 3-5 steps with a railing? : A Little 6 Click Score: 18    End of Session Equipment Utilized During Treatment: Gait belt Activity Tolerance: Patient tolerated treatment well Patient left: in chair;with call bell/phone within reach;with chair alarm set;with family/visitor present Nurse Communication: Mobility status PT Visit Diagnosis: Muscle weakness (generalized) (M62.81);Difficulty in walking, not elsewhere classified (R26.2);Pain Pain - Right/Left: Right Pain - part of body: Hip     Time: 7858-8502 PT Time Calculation (min) (ACUTE ONLY): 23 min  Charges:  $Gait Training: 8-22 mins $Therapeutic Exercise: 8-22 mins                     [employer], PT, GCS 01/11/21,11:25 AM

## 2021-01-11 NOTE — Discharge Summary (Signed)
Physician Discharge Summary  Patient ID: [name] [name] MRN: [id number] DOB/AGE: 75/05/1946 75 y.o.  Admit date: 01/10/2021 Discharge date: 01/11/2021  Admission Diagnoses:  Hx of total hip arthroplasty, right [Z96.641]  Surgeries:  PROCEDURE:  Right total hip arthroplasty   SURGEON:  [name] [name]. M.D.   ASSISTANT: [name] Smiles, PA-C (present and scrubbed throughout the case, critical for assistance with exposure, retraction, instrumentation, and closure)   ANESTHESIA: spinal   ESTIMATED BLOOD LOSS: 250 mL   FLUIDS REPLACED: 1800 mL of crystalloid   DRAINS: 2 medium Hemovac drains   IMPLANTS UTILIZED: DePuy 13.5 mm large stature AML femoral stem, 58 mm OD Pinnacle 100 acetabular component, neutral Pinnacle Marathon polyethylene insert, and a 36 mm M-SPEC -2 mm hip ball  Discharge Diagnoses: Patient Active Problem List   Diagnosis Date Noted   Hx of total hip arthroplasty, right 01/10/2021   Primary osteoarthritis of right hip 10/15/2020   OSA on CPAP 05/09/2020   Hx of total hip arthroplasty, left 02/23/2020   Accumulation of fluid in tissues 02/22/2020   Adaptation reaction 02/22/2020   Adenomatous colon polyp 02/22/2020   Hydrocele 02/22/2020   S/P total knee arthroplasty, bilateral 10/17/2019   Myalgia due to statin 04/12/2019   Iron deficiency anemia 03/19/2019   Pure hypercholesterolemia 02/08/2016   Benign non-nodular prostatic hyperplasia with lower urinary tract symptoms 06/08/2015   Anxiety 12/15/2014   At risk for falling 12/15/2014   Diabetes mellitus type 2, controlled ([place]) 12/15/2014   Dyssomnia 12/15/2014   BP (high blood pressure) 12/15/2014   Cannot sleep 12/15/2014   Pain in joint, lower leg 12/15/2014   Adiposity 12/15/2014   Screening for gout 12/15/2014   Avitaminosis D 12/15/2014   Absolute anemia 12/15/2014   Insomnia 12/15/2014   Elevated platelet count 12/15/2014   Hyperlipidemia 12/15/2014   Hemorrhoids    ANEMIA-UNSPECIFIED  01/19/2010   DM 10/31/2008   DIVERTICULOSIS OF COLON 10/31/2008   RECTAL BLEEDING 10/31/2008    Past Medical History:  Diagnosis Date   Anemia    iron deficiency and b12 deficiency.  followed by oncology   Anxiety    Arthritis    Atrial fibrillation ([place]) 12/28/2020   BPH (benign prostatic hyperplasia)    Diabetes mellitus    Diverticulosis    Elevated platelet count    Hemorrhoids    HOH (hard of hearing)    wears hearing aides   Hypertension    OSA on CPAP    Prostate disorder      Transfusion:    Consultants (if any):   Discharged Condition: Improved  Hospital Course: [name] [name] is an 75 y.o. male who was admitted 01/10/2021 with a diagnosis of right hip osteoarthritis and went to the operating room on 01/10/2021 and underwent right total hip arthroplasty through posterior approach. The patient received perioperative antibiotics for prophylaxis (see below). The patient tolerated the procedure well and was transported to PACU in stable condition. After meeting PACU criteria, the patient was subsequently transferred to the Orthopaedics/Rehabilitation unit.   The patient received DVT prophylaxis in the form of early mobilization, Lovenox, Foot Pumps, and TED hose. A sacral pad had been placed and heels were elevated off of the bed with rolled towels in order to protect skin integrity. Foley catheter was discontinued on postoperative day #0. Wound drains were discontinued on postoperative day #1. The surgical incision was healing well without signs of infection.  Physical therapy was initiated postoperatively for transfers, gait training, and strengthening. Occupational therapy  was initiated for activities of daily living and evaluation for assisted devices. Rehabilitation goals were reviewed in detail with the patient. The patient made steady progress with physical therapy and physical therapy recommended discharge to Home.   The patient achieved the preliminary goals of this  hospitalization and was felt to be medically and orthopaedically appropriate for discharge.  He was given perioperative antibiotics:  Anti-infectives (From admission, onward)    Start     Dose/Rate Route Frequency Ordered Stop   01/10/21 1400  ceFAZolin (ANCEF) IVPB 2g/100 mL premix        2 g 200 mL/hr over 30 Minutes Intravenous Every 6 hours 01/10/21 1313 01/10/21 2056   01/10/21 0600  ceFAZolin (ANCEF) IVPB 3g/100 mL premix        3 g 200 mL/hr over 30 Minutes Intravenous On call to O.R. 01/10/21 0136 01/10/21 0815     .  Recent vital signs:  Vitals:   01/11/21 1236 01/11/21 1602  BP: 132/71 127/66  Pulse: 72 87  Resp: 18 17  Temp: 97.9 F (36.6 C) 98.1 F (36.7 C)  SpO2: 100% 100%    Recent laboratory studies:  No results for input(s): WBC, HGB, HCT, PLT, K, CL, CO2, BUN, CREATININE, GLUCOSE, CALCIUM, LABPT, INR in the last 72 hours.  Diagnostic Studies: ECHOCARDIOGRAM COMPLETE  Result Date: 01/03/2021    ECHOCARDIOGRAM REPORT   Patient Name:   [name] [name] Date of Exam: 01/03/2021 Medical Rec #:  [id number]  Height:       71.0 in Accession #:    [id number] Weight:       291.1 lb Date of Birth:  1946/05/06  BSA:          2.473 m Patient Age:    75 years   BP:           140/72 mmHg Patient Gender: M          HR:           80 bpm. Exam Location:  [facility] Procedure: 2D Echo, Cardiac Doppler, Color Doppler and Intracardiac            Opacification Agent Indications:    I48.91* Unspeicified atrial fibrillation  History:        Patient has no prior history of Echocardiogram examinations.                 Arrythmias:Atrial Fibrillation; Risk Factors:Hypertension,                 Diabetes, Dyslipidemia, Non-Smoker and Sleep Apnea. Pre-surgical                 assessment.  Sonographer:    [name] [name] RDMS, RVT, RDCS Referring Phys: [name] [name]  1. Left ventricular ejection fraction, by estimation, is 55 to 60%. The left ventricle has normal function. The left ventricle  has no regional wall motion abnormalities. Left ventricular diastolic parameters are indeterminate.  2. Right ventricular systolic function is normal. The right ventricular size is normal.  3. Left atrial size was mildly dilated.  4. The mitral valve is normal in structure. Trivial mitral valve regurgitation.  5. The aortic valve is tricuspid. Aortic valve regurgitation is not visualized.  6. The inferior vena cava is normal in size with greater than 50% respiratory variability, suggesting right atrial pressure of 3 mmHg. FINDINGS  Left Ventricle: Left ventricular ejection fraction, by estimation, is 55 to 60%. The left ventricle has normal function. The left ventricle has  no regional wall motion abnormalities. Definity contrast agent was given IV to delineate the left ventricular  endocardial borders. The left ventricular internal cavity size was normal in size. There is no left ventricular hypertrophy. Left ventricular diastolic parameters are indeterminate. Right Ventricle: The right ventricular size is normal. No increase in right ventricular wall thickness. Right ventricular systolic function is normal. Left Atrium: Left atrial size was mildly dilated. Right Atrium: Right atrial size was normal in size. Pericardium: There is no evidence of pericardial effusion. Mitral Valve: The mitral valve is normal in structure. Trivial mitral valve regurgitation. Tricuspid Valve: The tricuspid valve is normal in structure. Tricuspid valve regurgitation is not demonstrated. Aortic Valve: The aortic valve is tricuspid. Aortic valve regurgitation is not visualized. Aortic valve mean gradient measures 3.0 mmHg. Aortic valve peak gradient measures 5.6 mmHg. Aortic valve area, by VTI measures 3.05 cm. Pulmonic Valve: The pulmonic valve was not well visualized. Pulmonic valve regurgitation is not visualized. Aorta: The aortic root is normal in size and structure. Venous: The inferior vena cava is normal in size with greater than  50% respiratory variability, suggesting right atrial pressure of 3 mmHg. IAS/Shunts: No atrial level shunt detected by color flow Doppler.  LEFT VENTRICLE PLAX 2D LVIDd:         4.80 cm LVIDs:         3.00 cm LV PW:         1.00 cm LV IVS:        1.10 cm LVOT diam:     2.20 cm LV SV:         72 LV SV Index:   29 LVOT Area:     3.80 cm  RIGHT VENTRICLE             IVC RV Basal diam:  4.00 cm     IVC diam: 1.90 cm RV S prime:     19.00 cm/s TAPSE (M-mode): 3.8 cm LEFT ATRIUM             Index LA diam:        5.40 cm 2.18 cm/m LA Vol (A2C):   96.1 ml 38.85 ml/m LA Vol (A4C):   86.4 ml 34.93 ml/m LA Biplane Vol: 93.5 ml 37.80 ml/m  AORTIC VALVE                   PULMONIC VALVE AV Area (Vmax):    3.01 cm    PV Vmax:       0.99 m/s AV Area (Vmean):   3.16 cm    PV Peak grad:  3.9 mmHg AV Area (VTI):     3.05 cm AV Vmax:           118.00 cm/s AV Vmean:          73.800 cm/s AV VTI:            0.235 m AV Peak Grad:      5.6 mmHg AV Mean Grad:      3.0 mmHg LVOT Vmax:         93.40 cm/s LVOT Vmean:        61.333 cm/s LVOT VTI:          0.189 m LVOT/AV VTI ratio: 0.80  AORTA Ao Root diam: 3.50 cm Ao Asc diam:  3.80 cm Ao Arch diam: 2.5 cm  SHUNTS Systemic VTI:  0.19 m Systemic Diam: 2.20 cm Kate Sable MD Electronically signed by Kate Sable MD Signature Date/Time: 01/03/2021/4:30:22 PM  Final    DG Hip Port Unilat With Pelvis 1V Right  Result Date: 01/10/2021 CLINICAL DATA:  Right total hip arthroplasty. EXAM: DG HIP (WITH OR WITHOUT PELVIS) 1V PORT RIGHT COMPARISON:  Radiographs 02/23/2020 FINDINGS: Portable AP pelvis and cross-table lateral view of the right hip. The upper pelvis is excluded. Patient has undergone interval right total hip arthroplasty without apparent complication. There is no evidence of acute fracture or dislocation. Patient has undergone previous left total hip arthroplasty. There is a small amount of gas in the soft tissue surrounding the right hip. Skin staples are in place.  IMPRESSION: No demonstrated complication following right total hip arthroplasty. Electronically Signed   By: Richardean Sale M.D.   On: 01/10/2021 14:25    Discharge Medications:   Allergies as of 01/11/2021   No Known Allergies      Medication List     TAKE these medications    acetaminophen 650 MG CR tablet Commonly known as: TYLENOL Take 1,300 mg by mouth every 8 (eight) hours as needed for pain. What changed: Another medication with the same name was removed. Continue taking this medication, and follow the directions you see here.   ALPRAZolam 0.5 MG tablet Commonly known as: XANAX Take 1 mg by mouth at bedtime.   amLODipine 10 MG tablet Commonly known as: NORVASC Take 1 tablet (10 mg total) by mouth daily.   amoxicillin 500 MG capsule Commonly known as: AMOXIL Take 2,000 mg by mouth as needed (Prior to dental procedures).   benazepril-hydrochlorthiazide 20-12.5 MG tablet Commonly known as: LOTENSIN HCT Take 1 tablet by mouth every evening.   celecoxib 200 MG capsule Commonly known as: CELEBREX Take 200 mg by mouth daily. What changed: Another medication with the same name was added. Make sure you understand how and when to take each.   celecoxib 200 MG capsule Commonly known as: CELEBREX Take 1 capsule (200 mg total) by mouth 2 (two) times daily. What changed: You were already taking a medication with the same name, and this prescription was added. Make sure you understand how and when to take each.   enoxaparin 40 MG/0.4ML injection Commonly known as: LOVENOX Inject 0.4 mLs (40 mg total) into the skin daily for 5 days.   glimepiride 1 MG tablet Commonly known as: AMARYL Take 1 mg by mouth daily with breakfast.   metFORMIN 500 MG 24 hr tablet Commonly known as: GLUCOPHAGE-XR Take 1,000 mg by mouth daily with supper.   naphazoline-glycerin 0.012-0.25 % Soln Commonly known as: CLEAR EYES REDNESS Place 1-2 drops into both eyes 4 (four) times daily as needed  for eye irritation.   pravastatin 10 MG tablet Commonly known as: PRAVACHOL Take 10 mg by mouth daily with supper.   Rybelsus 14 MG Tabs Generic drug: Semaglutide Take 14 mg by mouth every morning.   sertraline 50 MG tablet Commonly known as: ZOLOFT Take 50 mg by mouth every morning.   tamsulosin 0.4 MG Caps capsule Commonly known as: FLOMAX TAKE 2 CAPSULES BY MOUTH EVERY DAY AFTERBREAKFAST   traMADol 50 MG tablet Commonly known as: ULTRAM Take 1 tablet (50 mg total) by mouth every 4 (four) hours as needed for moderate pain.   traMADol 50 MG tablet Commonly known as: Ultram Take 1 tablet (50 mg total) by mouth every 4 (four) hours as needed.   vitamin B-12 1000 MCG tablet Commonly known as: CYANOCOBALAMIN Take 1,000 mcg by mouth daily.   zinc gluconate 50 MG tablet Take 50 mg by  mouth daily.               Durable Medical Equipment  (From admission, onward)           Start     Ordered   01/10/21 1314  DME Walker rolling  Once       Question:  Patient needs a walker to treat with the following condition  Answer:  S/P total hip arthroplasty   01/10/21 1313   01/10/21 1314  DME Bedside commode  Once       Question:  Patient needs a bedside commode to treat with the following condition  Answer:  S/P total hip arthroplasty   01/10/21 1313            Disposition: Home with home health PT     Follow-up Information     Hooten, Laurice Record, MD Follow up on 02/22/2021.   Specialty: Orthopedic Surgery Why: at 9:30am Contact information: [place] [address] [place], PA-C 01/11/2021, 5:56 PM

## 2021-01-11 NOTE — Anesthesia Postprocedure Evaluation (Signed)
Anesthesia Post Note  Patient: [name] [name]  Procedure(s) Performed: TOTAL HIP ARTHROPLASTY (Right: Hip)  Patient location during evaluation: Nursing Unit Anesthesia Type: Spinal Level of consciousness: oriented and awake and alert Pain management: pain level controlled Vital Signs Assessment: post-procedure vital signs reviewed and stable Respiratory status: spontaneous breathing and respiratory function stable Cardiovascular status: blood pressure returned to baseline and stable Postop Assessment: no headache, no backache, no apparent nausea or vomiting and patient able to bend at knees Anesthetic complications: no   No notable events documented.   Last Vitals:  Vitals:   01/10/21 2000 01/11/21 0758  BP: 109/70 121/73  Pulse: 67 76  Resp: 18 16  Temp: 37 C 36.6 C  SpO2: 97% 100%    Last Pain:  Vitals:   01/10/21 2000  TempSrc: Oral  PainSc: 0-No pain                 Clarence Cogswell Lily Peer

## 2021-01-11 NOTE — Progress Notes (Signed)
DISCHARGE NOTE:  Pt given discharge instructions and tramadol script. Pt verbalized understanding. TED hose on both legs, walker and extra honeycomb dressings sent with pt. Pt wheeled to car by staff. Family providing transportation.

## 2021-01-11 NOTE — Progress Notes (Signed)
Met with the patient and his family in the room, He has DME a rolling walker and a 3 in 1 at home and does not need additional DME He is set up prior to surgery with [facility] from the surgeons office He has transportation  He can afford his medications

## 2021-01-11 NOTE — Evaluation (Signed)
Occupational Therapy Evaluation Patient Details Name: [name] [name] MRN: [id number] DOB: [birth year] Today's Date: 01/11/2021    History of Present Illness Pt is a 75 y.o. 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  PMH includes anxiety, BPH, DM, HOH, htn, prostate disorder, sleep apnea (CPAP), B TKA, L THA, a-fib.   Clinical Impression   Pt seen for OT evaluation this date, POD#1 from above surgery. Pt was independent in all ADLs prior to surgery, however occasionally using a SPC due to R hip pain. Pt is eager to return to PLOF with less pain and improved safety and independence. Provided educ re: post hip precautions, pt verbalized understanding and was able to recall 3/3 at end of session. Pt instructed in self care skills, falls prevention strategies, home/routines modifications, DME/AE for LB bathing and dressing tasks, compression stocking mgt strategies, and car transfer techniques. Pt and spouse express understanding. No further OT services are required at this time. Pt has all needed DME and AE. This is his second THA; both he and his wife are aware of and comfortable with post-surgery care needs at home.    Follow Up Recommendations  No OT follow up    Equipment Recommendations  None recommended by OT    Recommendations for Other Services       Precautions / Restrictions Precautions Precautions: Posterior Hip;Fall Restrictions Weight Bearing Restrictions: Yes RLE Weight Bearing: Weight bearing as tolerated      Mobility Bed Mobility Overal bed mobility: Needs Assistance Bed Mobility: Supine to Sit     Supine to sit: Min guard;HOB elevated          Transfers Overall transfer level: Needs assistance Equipment used: Rolling walker (2 wheeled) Transfers: Sit to/from Stand Sit to Stand: Min guard              Balance Overall balance assessment: Needs assistance Sitting-balance support: No upper extremity supported;Feet supported Sitting  balance-Leahy Scale: Good Sitting balance - Comments: good balance, able to reach beyond BOS   Standing balance support: No upper extremity supported Standing balance-Leahy Scale: Good                             ADL either performed or assessed with clinical judgement   ADL Overall ADL's : Needs assistance/impaired     Grooming: Independent;Set up               Lower Body Dressing: Moderate assistance   Toilet Transfer: Minimal assistance                   Vision Patient Visual Report: No change from baseline       Perception     Praxis      Pertinent Vitals/Pain Pain Score: 0-No pain Pain Location: R hip Pain Descriptors / Indicators: Sore     Hand Dominance     Extremity/Trunk Assessment Upper Extremity Assessment Upper Extremity Assessment: Overall WFL for tasks assessed   Lower Extremity Assessment Lower Extremity Assessment: RLE deficits/detail;LLE deficits/detail RLE Deficits / Details: at least 3-/5 hip flexion; at least 3/5 knee extension and DF/PF AROM RLE: Unable to fully assess due to pain RLE Sensation: decreased light touch LLE Sensation: WNL LLE Coordination: WNL   Cervical / Trunk Assessment Cervical / Trunk Assessment: Normal   Communication Communication Communication: HOH   Cognition Arousal/Alertness: Awake/alert Behavior During Therapy: WFL for tasks assessed/performed Overall Cognitive Status: Within Functional Limits for tasks  assessed                                     General Comments       Exercises Other Exercises Other Exercises: educ re: hip precautions, AE for LE dressing/bathing, routines modifications   Shoulder Instructions      Home Living Family/patient expects to be discharged to:: Private residence Living Arrangements: Spouse/significant other Available Help at Discharge: Family;Available 24 hours/day Type of Home: House Home Access: Stairs to enter [employer] of Steps: 2 steps with B railings (can reach both) from front and 1 step with B railing (unable to reach both) from garage Entrance Stairs-Rails: Right;Left;Can reach both Home Layout: One level;Laundry or work area in basement;Able to live on main level with bedroom/bathroom     Bathroom Shower/Tub: Occupational psychologist: Handicapped height     Home Equipment: Environmental consultant - 2 wheels;Cane - single point;Bedside commode;Shower seat - built in;Grab bars - toilet;Crutches;Grab bars - tub/shower          Prior Functioning/Environment Level of Independence: Independent with assistive device(s)        Comments: Recently ambulating with SPC d/t hip pain.  Denies falls.        OT Problem List: Decreased strength;Decreased range of motion;Decreased activity tolerance;Impaired sensation      OT Treatment/Interventions:      OT Goals(Current goals can be found in the care plan section) Acute Rehab OT Goals Patient Stated Goal: to be back on the golf course w/in 6 wks OT Goal Formulation: With patient Time For Goal Achievement: 01/25/21 Potential to Achieve Goals: Good  OT Frequency:     Barriers to D/C:            Co-evaluation              AM-PAC OT "6 Clicks" Daily Activity     Outcome Measure Help from another person eating meals?: None Help from another person taking care of personal grooming?: None Help from another person toileting, which includes using toliet, bedpan, or urinal?: A Little Help from another person bathing (including washing, rinsing, drying)?: A Little Help from another person to put on and taking off regular upper body clothing?: None Help from another person to put on and taking off regular lower body clothing?: A Little 6 Click Score: 21   End of Session Equipment Utilized During Treatment: Rolling walker  Activity Tolerance: Patient tolerated treatment well Patient left: in chair;with call bell/phone within reach;with  family/visitor present;with chair alarm set  OT Visit Diagnosis: Other abnormalities of gait and mobility (R26.89);Muscle weakness (generalized) (M62.81)                Time: 0388-8280 OT Time Calculation (min): 13 min Charges:  OT General Charges $OT Visit: 1 Visit OT Evaluation $OT Eval Low Complexity: 1 Low OT Treatments $Self Care/Home Management : 8-22 mins Josiah Lobo, PhD, MS, OTR/L 01/11/21, 11:08 AM

## 2021-01-11 NOTE — Progress Notes (Signed)
  Subjective: 1 Day Post-Op Procedure(s) (LRB): TOTAL HIP ARTHROPLASTY (Right) Patient reports pain as well-controlled.   Patient is well, and has had no acute complaints or problems Plan is to go Home after hospital stay. Negative for chest pain and shortness of breath Fever: no Gastrointestinal: negative for nausea and vomiting.  Patient has not had a bowel movement.  Objective: Vital signs in last 24 hours: Temp:  [97.2 F (36.2 C)-98.6 F (37 C)] 97.8 F (36.6 C) (07/14 0758) Pulse Rate:  [67-79] 76 (07/14 0758) Resp:  [7-18] 16 (07/14 0758) BP: (98-135)/(55-83) 121/73 (07/14 0758) SpO2:  [89 %-100 %] 100 % (07/14 0758)  Intake/Output from previous day:  Intake/Output Summary (Last 24 hours) at 01/11/2021 0834 Last data filed at 01/11/2021 0555 Gross per 24 hour  Intake 3348.33 ml  Output 3390 ml  Net -41.67 ml    Intake/Output this shift: No intake/output data recorded.  Labs: No results for input(s): HGB in the last 72 hours. No results for input(s): WBC, RBC, HCT, PLT in the last 72 hours. No results for input(s): NA, K, CL, CO2, BUN, CREATININE, GLUCOSE, CALCIUM in the last 72 hours. No results for input(s): LABPT, INR in the last 72 hours.   EXAM General - Patient is Alert, Appropriate, and Oriented Extremity - Neurovascular intact Dorsiflexion/Plantar flexion intact Compartment soft Dressing/Incision -honeycomb dressing saturated, no drainage on ABD Motor Function - intact, moving foot and toes well on exam. Able to perform SLR with assistance.   Cardiovascular- Regular rate and rhythm, no murmurs/rubs/gallops Respiratory- Lungs clear to auscultation bilaterally Gastrointestinal- soft, nontender, and active bowel sounds   Assessment/Plan: 1 Day Post-Op Procedure(s) (LRB): TOTAL HIP ARTHROPLASTY (Right) Active Problems:   Hx of total hip arthroplasty, right  Estimated body mass index is 39.61 kg/m as calculated from the following:   Height as of this  encounter: 5\' 11"  (1.803 m).   Weight as of this encounter: 128.8 kg. Advance diet Up with therapy       DVT Prophylaxis - Lovenox, Ted hose, and foot pumps Weight-Bearing as tolerated to right leg  Cassell Smiles, PA-C [facility] 01/11/2021, 8:34 AM

## 2021-01-12 ENCOUNTER — Telehealth: Payer: Self-pay | Admitting: Cardiovascular Disease

## 2021-01-12 MED ORDER — APIXABAN 5 MG PO TABS: 5.0000 mg | ORAL_TABLET | Freq: Two times a day (BID) | ORAL | 1 refills | Status: DC

## 2021-01-12 NOTE — Telephone Encounter (Signed)
Patient recently had hip replacement and was prescribed blood thinner shots by Dr.Hooten. Patient stated he had a conversation with Dr.Arida regarding Eliquis and was calling in to find out what is the best course of action for him. Patient was advised to not take the shots and eliquis together and currently has 5 days worth of the shots. Please advise patient whether to continue his shots or if Dr.Arida will be able to prescribe eliquis

## 2021-01-12 NOTE — Telephone Encounter (Signed)
Message fwd to Dr. Arida to advise. 

## 2021-01-12 NOTE — Addendum Note (Signed)
Addended by: Lamar Laundry on: 01/12/2021 12:44 PM   Modules accepted: Orders

## 2021-01-12 NOTE — Telephone Encounter (Signed)
I discussed with Dr. Marry Guan.  I recommend starting Eliquis 5 mg twice daily the day after he is done with his Lovenox shots.

## 2021-01-12 NOTE — Telephone Encounter (Signed)
Patient made aware if Dr. Tyrell Antonio response and recommendation. Rx for Elqiquis 5 mg bid #60 R-1 has been sent to the patients pharmacy. Patient adv to complete his lovenox injections and then start eliquis 5 mg twice a day the next day. Patient verbalized understanding and voiced appreciation for the call.

## 2021-02-01 NOTE — Progress Notes (Signed)
Cardiology Office Note    Date:  02/08/2021   ID:  [name] [name], DOB 10/31/1945, MRN [id number]  PCP:  Unice Bailey, MD  Cardiologist:  [name], MD  Electrophysiologist:  None   Chief Complaint: Follow-up  History of Present Illness:   [name] [name] is a 75 y.o. male with history of recently diagnosed A. fib of uncertain chronicity, DM2, HTN, HLD, iron deficiency anemia requiring IV iron, osteoarthritis, diverticulosis, OSA on CPAP, BPH, and obesity who presents for follow-up of A. fib.  He was seen as a new patient by Dr. Fletcher Anon on 01/02/2021 for preoperative cardiovascular risk stratification before total hip arthroplasty given preop EKG 12/28/2020 showed newly diagnosed A. fib.  At his visit with our office on 7/5, he was not aware of any prior history of arrhythmia.  He had no previously known cardiac history.  He denied any symptoms of angina or decompensation.  He remained in A. fib with ventricular rates in the 90s bpm.  Given his CHA2DS2-VASc of 4, initiation of Eliquis was recommended following hip surgery.  He was not requiring rate controlling medications.  Echo on 01/03/2021 demonstrated an EF of 55 to 60%, no regional wall motion abnormalities, indeterminate LV diastolic function parameters, normal RV systolic function and ventricular cavity size, mildly dilated left atrium, trivial mitral regurgitation, and an estimated right atrial pressure of 3 mmHg.  He comes in accompanied by his wife today and is doing well from a cardiac perspective.  No chest pain, dyspnea, dizziness, presyncope, or syncope.  He does note an uptick in underlying fatigue.  No lower extremity swelling, weight gain, or orthopnea.  No falls, hematochezia, melena, hemoptysis, hematuria, or hematemesis.  He did start taking Eliquis 5 mg twice daily on 01/16/2021 and has not missed any doses.  He is interested in pursuing DCCV.  BP at home frequently ranges from the mid Q000111Q to Q000111Q systolic over [id number] diastolic.  He  has not noted any significant sustained tachycardic rates at home.  He is progressing well following his recent hip arthroplasty.   Labs independently reviewed: 11/2020 - potassium 3.9, BUN 15, serum creatinine 0.81, albumin 4.1, AST/ALT not elevated, Hgb 10.9, PLT 493, A1c 7.3 08/2020 - TC 159, TG 113, HDL 54, LDL 82 03/2019 - TSH normal  Past Medical History:  Diagnosis Date   Anemia    iron deficiency and b12 deficiency.  followed by oncology   Anxiety    Arthritis    Atrial fibrillation ([place]) 12/28/2020   BPH (benign prostatic hyperplasia)    Diabetes mellitus    Diverticulosis    Elevated platelet count    Hemorrhoids    HOH (hard of hearing)    wears hearing aides   Hypertension    OSA on CPAP    Prostate disorder     Past Surgical History:  Procedure Laterality Date   CATARACT EXTRACTION W/PHACO Left 07/14/2018   Procedure: CATARACT EXTRACTION PHACO AND INTRAOCULAR LENS PLACEMENT (IOC) LEFT, DIABETIC;  Surgeon: Birder Robson, MD;  Location: [facility];  Service: Ophthalmology;  Laterality: Left;  [place] 00:50 CDE 8.93 Fluid pack lot # [id number]   CATARACT EXTRACTION W/PHACO Right 09/01/2018   Procedure: CATARACT EXTRACTION PHACO AND INTRAOCULAR LENS PLACEMENT (IOC)-RIGHT, DIABETIC;  Surgeon: Birder Robson, MD;  Location: [facility];  Service: Ophthalmology;  Laterality: Right;  [place] 00:50.9 CDE 6.80 Fluid Pack Lot # [id number]     COLONOSCOPY     CYSTOSCOPY WITH INSERTION OF UROLIFT     CYSTOSCOPY WITH  INSERTION OF UROLIFT  08/2018   ESOPHAGOGASTRODUODENOSCOPY (EGD) WITH PROPOFOL N/A 03/17/2019   Procedure: ESOPHAGOGASTRODUODENOSCOPY (EGD) WITH PROPOFOL;  Surgeon: Tol[place], Benay Pike, MD;  Location: [facility];  Service: Gastroenterology;  Laterality: N/A;   EYE SURGERY Bilateral    cataract surgery   JOINT REPLACEMENT Bilateral 2014   Total knee replacement.   REPLACEMENT TOTAL KNEE Bilateral    TOTAL HIP ARTHROPLASTY Left 02/23/2020   Procedure: TOTAL HIP ARTHROPLASTY;   Surgeon: Dereck Leep, MD;  Location: [facility];  Service: Orthopedics;  Laterality: Left;   TOTAL HIP ARTHROPLASTY Right 01/10/2021   Procedure: TOTAL HIP ARTHROPLASTY;  Surgeon: Dereck Leep, MD;  Location: [facility];  Service: Orthopedics;  Laterality: Right;    Current Medications: Current Meds  Medication Sig   acetaminophen (TYLENOL) 650 MG CR tablet Take 1,300 mg by mouth every 8 (eight) hours as needed for pain.   ALPRAZolam (XANAX) 0.5 MG tablet Take 1 mg by mouth at bedtime.   amLODipine (NORVASC) 10 MG tablet Take 1 tablet (10 mg total) by mouth daily.   apixaban (ELIQUIS) 5 MG TABS tablet Take 1 tablet (5 mg total) by mouth 2 (two) times daily.   benazepril-hydrochlorthiazide (LOTENSIN HCT) 20-12.5 MG tablet Take 1 tablet by mouth every evening.   carvedilol (COREG) 3.125 MG tablet Take 1 tablet (3.125 mg total) by mouth 2 (two) times daily with a meal.   glimepiride (AMARYL) 1 MG tablet Take 1 mg by mouth daily with breakfast.   metFORMIN (GLUCOPHAGE-XR) 500 MG 24 hr tablet Take 1,000 mg by mouth daily with supper.    naphazoline-glycerin (CLEAR EYES REDNESS) 0.012-0.25 % SOLN Place 1-2 drops into both eyes 4 (four) times daily as needed for eye irritation.   pravastatin (PRAVACHOL) 10 MG tablet Take 10 mg by mouth daily with supper.    Semaglutide (RYBELSUS) 14 MG TABS Take 14 mg by mouth every morning.   sertraline (ZOLOFT) 50 MG tablet Take 50 mg by mouth every morning.   tamsulosin (FLOMAX) 0.4 MG CAPS capsule TAKE 2 CAPSULES BY MOUTH EVERY DAY AFTERBREAKFAST   traMADol (ULTRAM) 50 MG tablet Take 1 tablet (50 mg total) by mouth every 4 (four) hours as needed for moderate pain.   vitamin B-12 (CYANOCOBALAMIN) 1000 MCG tablet Take 1,000 mcg by mouth daily.   zinc gluconate 50 MG tablet Take 50 mg by mouth daily.    Allergies:   Patient has no known allergies.   Social History   Socioeconomic History   Marital status: Married    Spouse name: Bonnita Nasuti   Number of  children: 3   Years of education: Not on file   Highest education level: Not on file  Occupational History   Occupation: Higher education careers adviser, Clinical biochemist    Comment: retired  Tobacco Use   Smoking status: Never   Smokeless tobacco: Never  Vaping Use   Vaping Use: Never used  Substance and Sexual Activity   Alcohol use: Yes    Alcohol/week: 0.0 standard drinks    Comment: 3x week vodka   Drug use: Never   Sexual activity: Not on file  Other Topics Concern   Not on file  Social History Narrative   Patient lives with wife.  Feels safe   Social Determinants of Radio broadcast assistant Strain: Not on file  Food Insecurity: Not on file  Transportation Needs: Not on file  Physical Activity: Not on file  Stress: Not on file  Social Connections: Not on file  Family History:  The patient's family history includes Alzheimer's disease in his mother; Colon cancer in his sister; Diabetes in his mother.  ROS:   Review of Systems  Constitutional:  Positive for malaise/fatigue. Negative for chills, diaphoresis, fever and weight loss.  HENT:  Negative for congestion.   Eyes:  Negative for discharge and redness.  Respiratory:  Negative for cough, hemoptysis, sputum production, shortness of breath and wheezing.   Cardiovascular:  Negative for chest pain, palpitations, orthopnea, claudication, leg swelling and PND.  Gastrointestinal:  Negative for abdominal pain, blood in stool, heartburn, melena, nausea and vomiting.  Genitourinary:  Negative for hematuria.  Musculoskeletal:  Negative for falls and myalgias.  Skin:  Negative for rash.  Neurological:  Negative for dizziness, tingling, tremors, sensory change, speech change, focal weakness, loss of consciousness and weakness.  Endo/Heme/Allergies:  Does not bruise/bleed easily.  Psychiatric/Behavioral:  Negative for substance abuse. The patient is not nervous/anxious.   All other systems reviewed and are negative.   EKGs/Labs/Other Studies  Reviewed:    Studies reviewed were summarized above. The additional studies were reviewed today:  2D echo 01/03/2021: 1. Left ventricular ejection fraction, by estimation, is 55 to 60%. The  left ventricle has normal function. The left ventricle has no regional  wall motion abnormalities. Left ventricular diastolic parameters are  indeterminate.   2. Right ventricular systolic function is normal. The right ventricular  size is normal.   3. Left atrial size was mildly dilated.   4. The mitral valve is normal in structure. Trivial mitral valve  regurgitation.   5. The aortic valve is tricuspid. Aortic valve regurgitation is not  visualized.   6. The inferior vena cava is normal in size with greater than 50%  respiratory variability, suggesting right atrial pressure of 3 mmHg.   EKG:  EKG is ordered today.  The EKG ordered today demonstrates A. fib, 76 bpm, poor R wave progression along the precordial leads, no acute ST-T changes  Recent Labs: 12/28/2020: ALT 12; BUN 15; Creatinine, Ser 0.81; Hemoglobin 10.9; Platelets 493; Potassium 3.9; Sodium 134  Recent Lipid Panel    Component Value Date/Time   CHOL 170 12/20/2014 1238   TRIG 75 12/20/2014 1238   HDL 65 12/20/2014 1238   CHOLHDL 2.6 12/20/2014 1238   LDLCALC 90 12/20/2014 1238    PHYSICAL EXAM:    VS:  BP (!) 142/62 (BP Location: Left Arm, Patient Position: Sitting, Cuff Size: Normal)   Pulse 76   Ht '5\' 10"'$  (1.778 m)   Wt 290 lb 6 oz (131.7 kg)   SpO2 96%   BMI 41.66 kg/m   BMI: Body mass index is 41.66 kg/m.  Physical Exam Vitals reviewed.  Constitutional:      Appearance: He is well-developed.  HENT:     Head: Normocephalic and atraumatic.  Eyes:     General:        Right eye: No discharge.        Left eye: No discharge.  Neck:     Vascular: No JVD.  Cardiovascular:     Rate and Rhythm: Normal rate. Rhythm irregularly irregular.     Pulses:          Posterior tibial pulses are 2+ on the right side and 2+  on the left side.     Heart sounds: Normal heart sounds, S1 normal and S2 normal. Heart sounds not distant. No midsystolic click and no opening snap. No murmur heard.   No friction rub.  Pulmonary:     Effort: Pulmonary effort is normal. No respiratory distress.     Breath sounds: Normal breath sounds. No decreased breath sounds, wheezing or rales.  Chest:     Chest wall: No tenderness.  Abdominal:     General: There is no distension.     Palpations: Abdomen is soft.     Tenderness: There is no abdominal tenderness.  Musculoskeletal:     Cervical back: Normal range of motion.     Right lower leg: No edema.     Left lower leg: No edema.  Skin:    General: Skin is warm and dry.     Nails: There is no clubbing.  Neurological:     Mental Status: He is alert and oriented to person, place, and time.  Psychiatric:        Speech: Speech normal.        Behavior: Behavior normal.        Thought Content: Thought content normal.        Judgment: Judgment normal.    Wt Readings from Last 3 Encounters:  02/08/21 290 lb 6 oz (131.7 kg)  01/10/21 284 lb (128.8 kg)  01/02/21 291 lb 2 oz (132.1 kg)     ASSESSMENT & PLAN:   A. fib: He remains in A. fib with controlled ventricular response, not currently on AV nodal blocking medications.  Duration of A. fib is unknown.  He is largely asymptomatic from his A. fib outside of possible increase in fatigue.  However, fatigue is likely multifactorial including recent surgery, obesity, physical deconditioning, and underlying A. fib.  He has been on Eliquis 5 mg twice daily without missing any doses since 01/16/2021.  We will pursue DCCV with his primary cardiologist on 02/19/2021.  This allows for adequate uninterrupted anticoagulation.  Given mildly elevated BP, and an effort for rate control, we will initiate carvedilol 3.125 mg twice daily.  If heart rate allows when he is seen following his DCCV, consider up titration of carvedilol with tapering of  amlodipine.  Risks of [place] were discussed.  Check CBC, BMP, and TSH.  HTN: Blood pressure mildly elevated in the office today.  Add carvedilol 3.125 mg twice daily as outlined above.  He will otherwise continue amlodipine and benazepril/HCTZ.  HLD: LDL 82.  He remains on pravastatin.  Iron deficiency anemia: No symptoms concerning for bleeding.  Hemoglobin stable on last check.  Check CBC on [place].  OSA: Uses a CPAP nightly.  Disposition: F/u with Dr. Fletcher Anon or an APP in 1 month following DCCV.   Medication Adjustments/Labs and Tests Ordered: Current medicines are reviewed at length with the patient today.  Concerns regarding medicines are outlined above. Medication changes, Labs and Tests ordered today are summarized above and listed in the Patient Instructions accessible in Encounters.   Signed, Christell Faith, PA-C 02/08/2021 10:18 AM     [facility] [place] [address] [place], [place] [id number] [phone]

## 2021-02-01 NOTE — H&P (View-Only) (Signed)
Cardiology Office Note    Date:  02/08/2021   ID:  [name] [name], DOB 1946-01-02, MRN [id number]  PCP:  Unice Bailey, MD  Cardiologist:  [name], MD  Electrophysiologist:  None   Chief Complaint: Follow-up  History of Present Illness:   [name] [name] is a 75 y.o. male with history of recently diagnosed A. fib of uncertain chronicity, DM2, HTN, HLD, iron deficiency anemia requiring IV iron, osteoarthritis, diverticulosis, OSA on CPAP, BPH, and obesity who presents for follow-up of A. fib.  He was seen as a new patient by Dr. Fletcher Anon on 01/02/2021 for preoperative cardiovascular risk stratification before total hip arthroplasty given preop EKG 12/28/2020 showed newly diagnosed A. fib.  At his visit with our office on 7/5, he was not aware of any prior history of arrhythmia.  He had no previously known cardiac history.  He denied any symptoms of angina or decompensation.  He remained in A. fib with ventricular rates in the 90s bpm.  Given his CHA2DS2-VASc of 4, initiation of Eliquis was recommended following hip surgery.  He was not requiring rate controlling medications.  Echo on 01/03/2021 demonstrated an EF of 55 to 60%, no regional wall motion abnormalities, indeterminate LV diastolic function parameters, normal RV systolic function and ventricular cavity size, mildly dilated left atrium, trivial mitral regurgitation, and an estimated right atrial pressure of 3 mmHg.  He comes in accompanied by his wife today and is doing well from a cardiac perspective.  No chest pain, dyspnea, dizziness, presyncope, or syncope.  He does note an uptick in underlying fatigue.  No lower extremity swelling, weight gain, or orthopnea.  No falls, hematochezia, melena, hemoptysis, hematuria, or hematemesis.  He did start taking Eliquis 5 mg twice daily on 01/16/2021 and has not missed any doses.  He is interested in pursuing DCCV.  BP at home frequently ranges from the mid Q000111Q to Q000111Q systolic over [id number] diastolic.  He  has not noted any significant sustained tachycardic rates at home.  He is progressing well following his recent hip arthroplasty.   Labs independently reviewed: 11/2020 - potassium 3.9, BUN 15, serum creatinine 0.81, albumin 4.1, AST/ALT not elevated, Hgb 10.9, PLT 493, A1c 7.3 08/2020 - TC 159, TG 113, HDL 54, LDL 82 03/2019 - TSH normal  Past Medical History:  Diagnosis Date   Anemia    iron deficiency and b12 deficiency.  followed by oncology   Anxiety    Arthritis    Atrial fibrillation ([place]) 12/28/2020   BPH (benign prostatic hyperplasia)    Diabetes mellitus    Diverticulosis    Elevated platelet count    Hemorrhoids    HOH (hard of hearing)    wears hearing aides   Hypertension    OSA on CPAP    Prostate disorder     Past Surgical History:  Procedure Laterality Date   CATARACT EXTRACTION W/PHACO Left 07/14/2018   Procedure: CATARACT EXTRACTION PHACO AND INTRAOCULAR LENS PLACEMENT (IOC) LEFT, DIABETIC;  Surgeon: Birder Robson, MD;  Location: [facility];  Service: Ophthalmology;  Laterality: Left;  [place] 00:50 CDE 8.93 Fluid pack lot # [id number]   CATARACT EXTRACTION W/PHACO Right 09/01/2018   Procedure: CATARACT EXTRACTION PHACO AND INTRAOCULAR LENS PLACEMENT (IOC)-RIGHT, DIABETIC;  Surgeon: Birder Robson, MD;  Location: [facility];  Service: Ophthalmology;  Laterality: Right;  [place] 00:50.9 CDE 6.80 Fluid Pack Lot # [id number]     COLONOSCOPY     CYSTOSCOPY WITH INSERTION OF UROLIFT     CYSTOSCOPY WITH  INSERTION OF UROLIFT  08/2018   ESOPHAGOGASTRODUODENOSCOPY (EGD) WITH PROPOFOL N/A 03/17/2019   Procedure: ESOPHAGOGASTRODUODENOSCOPY (EGD) WITH PROPOFOL;  Surgeon: Toledo, Benay Pike, MD;  Location: [facility];  Service: Gastroenterology;  Laterality: N/A;   EYE SURGERY Bilateral    cataract surgery   JOINT REPLACEMENT Bilateral 2014   Total knee replacement.   REPLACEMENT TOTAL KNEE Bilateral    TOTAL HIP ARTHROPLASTY Left 02/23/2020   Procedure: TOTAL HIP ARTHROPLASTY;   Surgeon: Dereck Leep, MD;  Location: [facility];  Service: Orthopedics;  Laterality: Left;   TOTAL HIP ARTHROPLASTY Right 01/10/2021   Procedure: TOTAL HIP ARTHROPLASTY;  Surgeon: Dereck Leep, MD;  Location: [facility];  Service: Orthopedics;  Laterality: Right;    Current Medications: Current Meds  Medication Sig   acetaminophen (TYLENOL) 650 MG CR tablet Take 1,300 mg by mouth every 8 (eight) hours as needed for pain.   ALPRAZolam (XANAX) 0.5 MG tablet Take 1 mg by mouth at bedtime.   amLODipine (NORVASC) 10 MG tablet Take 1 tablet (10 mg total) by mouth daily.   apixaban (ELIQUIS) 5 MG TABS tablet Take 1 tablet (5 mg total) by mouth 2 (two) times daily.   benazepril-hydrochlorthiazide (LOTENSIN HCT) 20-12.5 MG tablet Take 1 tablet by mouth every evening.   carvedilol (COREG) 3.125 MG tablet Take 1 tablet (3.125 mg total) by mouth 2 (two) times daily with a meal.   glimepiride (AMARYL) 1 MG tablet Take 1 mg by mouth daily with breakfast.   metFORMIN (GLUCOPHAGE-XR) 500 MG 24 hr tablet Take 1,000 mg by mouth daily with supper.    naphazoline-glycerin (CLEAR EYES REDNESS) 0.012-0.25 % SOLN Place 1-2 drops into both eyes 4 (four) times daily as needed for eye irritation.   pravastatin (PRAVACHOL) 10 MG tablet Take 10 mg by mouth daily with supper.    Semaglutide (RYBELSUS) 14 MG TABS Take 14 mg by mouth every morning.   sertraline (ZOLOFT) 50 MG tablet Take 50 mg by mouth every morning.   tamsulosin (FLOMAX) 0.4 MG CAPS capsule TAKE 2 CAPSULES BY MOUTH EVERY DAY AFTERBREAKFAST   traMADol (ULTRAM) 50 MG tablet Take 1 tablet (50 mg total) by mouth every 4 (four) hours as needed for moderate pain.   vitamin B-12 (CYANOCOBALAMIN) 1000 MCG tablet Take 1,000 mcg by mouth daily.   zinc gluconate 50 MG tablet Take 50 mg by mouth daily.    Allergies:   Patient has no known allergies.   Social History   Socioeconomic History   Marital status: Married    Spouse name: [name] [name]   Number of  children: 3   Years of education: Not on file   Highest education level: Not on file  Occupational History   Occupation: Higher education careers adviser, Clinical biochemist    Comment: retired  Tobacco Use   Smoking status: Never   Smokeless tobacco: Never  Vaping Use   Vaping Use: Never used  Substance and Sexual Activity   Alcohol use: Yes    Alcohol/week: 0.0 standard drinks    Comment: 3x week vodka   Drug use: Never   Sexual activity: Not on file  Other Topics Concern   Not on file  Social History Narrative   Patient lives with wife.  Feels safe   Social Determinants of Radio broadcast assistant Strain: Not on file  Food Insecurity: Not on file  Transportation Needs: Not on file  Physical Activity: Not on file  Stress: Not on file  Social Connections: Not on file  Family History:  The patient's family history includes Alzheimer's disease in his mother; Colon cancer in his sister; Diabetes in his mother.  ROS:   Review of Systems  Constitutional:  Positive for malaise/fatigue. Negative for chills, diaphoresis, fever and weight loss.  HENT:  Negative for congestion.   Eyes:  Negative for discharge and redness.  Respiratory:  Negative for cough, hemoptysis, sputum production, shortness of breath and wheezing.   Cardiovascular:  Negative for chest pain, palpitations, orthopnea, claudication, leg swelling and PND.  Gastrointestinal:  Negative for abdominal pain, blood in stool, heartburn, melena, nausea and vomiting.  Genitourinary:  Negative for hematuria.  Musculoskeletal:  Negative for falls and myalgias.  Skin:  Negative for rash.  Neurological:  Negative for dizziness, tingling, tremors, sensory change, speech change, focal weakness, loss of consciousness and weakness.  Endo/Heme/Allergies:  Does not bruise/bleed easily.  Psychiatric/Behavioral:  Negative for substance abuse. The patient is not nervous/anxious.   All other systems reviewed and are negative.   EKGs/Labs/Other Studies  Reviewed:    Studies reviewed were summarized above. The additional studies were reviewed today:  2D echo 01/03/2021: 1. Left ventricular ejection fraction, by estimation, is 55 to 60%. The  left ventricle has normal function. The left ventricle has no regional  wall motion abnormalities. Left ventricular diastolic parameters are  indeterminate.   2. Right ventricular systolic function is normal. The right ventricular  size is normal.   3. Left atrial size was mildly dilated.   4. The mitral valve is normal in structure. Trivial mitral valve  regurgitation.   5. The aortic valve is tricuspid. Aortic valve regurgitation is not  visualized.   6. The inferior vena cava is normal in size with greater than 50%  respiratory variability, suggesting right atrial pressure of 3 mmHg.   EKG:  EKG is ordered today.  The EKG ordered today demonstrates A. fib, 76 bpm, poor R wave progression along the precordial leads, no acute ST-T changes  Recent Labs: 12/28/2020: ALT 12; BUN 15; Creatinine, Ser 0.81; Hemoglobin 10.9; Platelets 493; Potassium 3.9; Sodium 134  Recent Lipid Panel    Component Value Date/Time   CHOL 170 12/20/2014 1238   TRIG 75 12/20/2014 1238   HDL 65 12/20/2014 1238   CHOLHDL 2.6 12/20/2014 1238   LDLCALC 90 12/20/2014 1238    PHYSICAL EXAM:    VS:  BP (!) 142/62 (BP Location: Left Arm, Patient Position: Sitting, Cuff Size: Normal)   Pulse 76   Ht '5\' 10"'$  (1.778 m)   Wt 290 lb 6 oz (131.7 kg)   SpO2 96%   BMI 41.66 kg/m   BMI: Body mass index is 41.66 kg/m.  Physical Exam Vitals reviewed.  Constitutional:      Appearance: He is well-developed.  HENT:     Head: Normocephalic and atraumatic.  Eyes:     General:        Right eye: No discharge.        Left eye: No discharge.  Neck:     Vascular: No JVD.  Cardiovascular:     Rate and Rhythm: Normal rate. Rhythm irregularly irregular.     Pulses:          Posterior tibial pulses are 2+ on the right side and 2+  on the left side.     Heart sounds: Normal heart sounds, S1 normal and S2 normal. Heart sounds not distant. No midsystolic click and no opening snap. No murmur heard.   No friction rub.  Pulmonary:     Effort: Pulmonary effort is normal. No respiratory distress.     Breath sounds: Normal breath sounds. No decreased breath sounds, wheezing or rales.  Chest:     Chest wall: No tenderness.  Abdominal:     General: There is no distension.     Palpations: Abdomen is soft.     Tenderness: There is no abdominal tenderness.  Musculoskeletal:     Cervical back: Normal range of motion.     Right lower leg: No edema.     Left lower leg: No edema.  Skin:    General: Skin is warm and dry.     Nails: There is no clubbing.  Neurological:     Mental Status: He is alert and oriented to person, place, and time.  Psychiatric:        Speech: Speech normal.        Behavior: Behavior normal.        Thought Content: Thought content normal.        Judgment: Judgment normal.    Wt Readings from Last 3 Encounters:  02/08/21 290 lb 6 oz (131.7 kg)  01/10/21 284 lb (128.8 kg)  01/02/21 291 lb 2 oz (132.1 kg)     ASSESSMENT & PLAN:   A. fib: He remains in A. fib with controlled ventricular response, not currently on AV nodal blocking medications.  Duration of A. fib is unknown.  He is largely asymptomatic from his A. fib outside of possible increase in fatigue.  However, fatigue is likely multifactorial including recent surgery, obesity, physical deconditioning, and underlying A. fib.  He has been on Eliquis 5 mg twice daily without missing any doses since 01/16/2021.  We will pursue DCCV with his primary cardiologist on 02/19/2021.  This allows for adequate uninterrupted anticoagulation.  Given mildly elevated BP, and an effort for rate control, we will initiate carvedilol 3.125 mg twice daily.  If heart rate allows when he is seen following his DCCV, consider up titration of carvedilol with tapering of  amlodipine.  Risks of [place] were discussed.  Check CBC, BMP, and TSH.  HTN: Blood pressure mildly elevated in the office today.  Add carvedilol 3.125 mg twice daily as outlined above.  He will otherwise continue amlodipine and benazepril/HCTZ.  HLD: LDL 82.  He remains on pravastatin.  Iron deficiency anemia: No symptoms concerning for bleeding.  Hemoglobin stable on last check.  Check CBC on [place].  OSA: Uses a CPAP nightly.  Disposition: F/u with Dr. Fletcher Anon or an APP in 1 month following DCCV.   Medication Adjustments/Labs and Tests Ordered: Current medicines are reviewed at length with the patient today.  Concerns regarding medicines are outlined above. Medication changes, Labs and Tests ordered today are summarized above and listed in the Patient Instructions accessible in Encounters.   Signed, Christell Faith, PA-C 02/08/2021 10:18 AM     [facility] - [address], [place] [address] [phone]

## 2021-02-06 ENCOUNTER — Ambulatory Visit: Payer: Self-pay | Admitting: Physician Assistant

## 2021-02-08 ENCOUNTER — Encounter: Payer: Self-pay | Admitting: Physician Assistant

## 2021-02-08 ENCOUNTER — Other Ambulatory Visit: Payer: Self-pay

## 2021-02-08 ENCOUNTER — Ambulatory Visit: Payer: Medicare Other | Admitting: Physician Assistant

## 2021-02-08 VITALS — BP 142/62 | HR 76 | Ht 70.0 in | Wt 290.4 lb

## 2021-02-08 DIAGNOSIS — Z79899 Other long term (current) drug therapy: Secondary | ICD-10-CM | POA: Diagnosis not present

## 2021-02-08 DIAGNOSIS — I4891 Unspecified atrial fibrillation: Secondary | ICD-10-CM

## 2021-02-08 DIAGNOSIS — Z9989 Dependence on other enabling machines and devices: Secondary | ICD-10-CM

## 2021-02-08 DIAGNOSIS — Z01810 Encounter for preprocedural cardiovascular examination: Secondary | ICD-10-CM

## 2021-02-08 DIAGNOSIS — G4733 Obstructive sleep apnea (adult) (pediatric): Secondary | ICD-10-CM

## 2021-02-08 DIAGNOSIS — I10 Essential (primary) hypertension: Secondary | ICD-10-CM | POA: Diagnosis not present

## 2021-02-08 DIAGNOSIS — D509 Iron deficiency anemia, unspecified: Secondary | ICD-10-CM

## 2021-02-08 DIAGNOSIS — E782 Mixed hyperlipidemia: Secondary | ICD-10-CM

## 2021-02-08 MED ORDER — CARVEDILOL 3.125 MG PO TABS: 3.1250 mg | ORAL_TABLET | Freq: Two times a day (BID) | ORAL | 3 refills | Status: DC

## 2021-02-08 NOTE — Patient Instructions (Addendum)
Medication Instructions:   Please START Coreg 3.125 mg twice a day  *If you need a refill on your cardiac medications before your next appointment, please call your pharmacy*   Lab Work: CBC, BMET, TSH  LABS WILL APPEAR ON MYCHART, ABNORMAL RESULTS WILL BE CALLED  Testing/Procedures: Cardioversion [facility])   Follow-Up: At [facility], you and your health needs are our priority.  As part of our continuing mission to provide you with exceptional heart care, we have created designated Provider Care Teams.  These Care Teams include your primary Cardiologist (physician) and Advanced Practice Providers (APPs -  Physician Assistants and Nurse Practitioners) who all work together to provide you with the care you need, when you need it.   Your next appointment:   1 month(s)  The format for your next appointment:   In Person  Provider:   Christell Faith, PA-C   Other Instructions  Cardioversion  Date: 02/19/2021  Arrive at 06:30 am   Start time 07:30 am  with Dr.Arida   Please arrive at the [facility], free valet parking is available  [address], [place], [place] [address]  INSTRUCTIONS:   Labs: CBC & BMP (completed at today's visit)  Nothing to eat or drink after midnight  May take your night time medications with a small sip of  water                  Medications:  HOLD: Any insulin or diabetic medications the morning of your procedure (may take after) Only half dose insulin at night ( if you take insulin) HOLD fluid pill benazepril-hydrochlorthiazide (LOTENSIN HCT) YOU MAY TAKE ALL of your remaining medications with a small amount of water. Continue your anticoagulant: Eliquis You will need to continue your anticoagulant after your procedure until you are told by your provider that it is safe to stop  Must have a responsible person to drive you home. They must stay in the waiting area during your procedure.  Failure to do so could result in  cancellation.  Bring a current list of your medications and current insurance cards.   If you have any questions after you get home, please call the office at [phone]  FYI: For your safety, and to allow [place] to monitor your vital signs accurately during the surgery/procedure we request that  if you have artificial nails, gel coating, SNS etc. Please have those removed prior to your surgery/procedure. Not having the nail coverings /polish removed may result in cancellation or delay of your surgery/procedure.

## 2021-02-09 ENCOUNTER — Other Ambulatory Visit: Payer: Self-pay | Admitting: *Deleted

## 2021-02-09 DIAGNOSIS — I4891 Unspecified atrial fibrillation: Secondary | ICD-10-CM

## 2021-02-09 DIAGNOSIS — D509 Iron deficiency anemia, unspecified: Secondary | ICD-10-CM

## 2021-02-09 DIAGNOSIS — D649 Anemia, unspecified: Secondary | ICD-10-CM

## 2021-02-12 ENCOUNTER — Other Ambulatory Visit
Admission: RE | Admit: 2021-02-12 | Discharge: 2021-02-12 | Disposition: A | Discharge status: Home / Self Care | Payer: Medicare Other | Source: Ambulatory Visit | Attending: Physician Assistant | Admitting: Physician Assistant

## 2021-02-12 DIAGNOSIS — D509 Iron deficiency anemia, unspecified: Secondary | ICD-10-CM | POA: Diagnosis present

## 2021-02-12 DIAGNOSIS — D649 Anemia, unspecified: Secondary | ICD-10-CM | POA: Diagnosis present

## 2021-02-12 DIAGNOSIS — I4891 Unspecified atrial fibrillation: Secondary | ICD-10-CM | POA: Diagnosis present

## 2021-02-12 LAB — CBC
HCT: 29.8 % — ABNORMAL LOW (ref 39.0–52.0)
Hemoglobin: 9.6 g/dL — ABNORMAL LOW (ref 13.0–17.0)
MCH: 27.3 pg (ref 26.0–34.0)
MCHC: 32.2 g/dL (ref 30.0–36.0)
MCV: 84.7 fL (ref 80.0–100.0)
Platelets: 489 10*3/uL — ABNORMAL HIGH (ref 150–400)
RBC: 3.52 MIL/uL — ABNORMAL LOW (ref 4.22–5.81)
RDW: 14.6 % (ref 11.5–15.5)
WBC: 10.5 10*3/uL (ref 4.0–10.5)
nRBC: 0 % (ref 0.0–0.2)

## 2021-02-12 NOTE — Telephone Encounter (Signed)
Patient returning call for results 

## 2021-02-12 NOTE — Telephone Encounter (Signed)
See results note: Reviewed results with patient and confirmed instructions for his cardioversion and medications. He verbalized understanding of our conversation with no further questions at this time.

## 2021-02-16 LAB — CBC
Hematocrit: 28.7 % — ABNORMAL LOW (ref 37.5–51.0)
Hemoglobin: 9.4 g/dL — ABNORMAL LOW (ref 13.0–17.7)
MCH: 28 pg (ref 26.6–33.0)
MCHC: 32.8 g/dL (ref 31.5–35.7)
MCV: 85 fL (ref 79–97)
Platelets: 642 10*3/uL — ABNORMAL HIGH (ref 150–450)
RBC: 3.36 x10E6/uL — ABNORMAL LOW (ref 4.14–5.80)
RDW: 13.7 % (ref 11.6–15.4)
WBC: 9.5 10*3/uL (ref 3.4–10.8)

## 2021-02-16 LAB — BASIC METABOLIC PANEL
BUN/Creatinine Ratio: 23 (ref 10–24)
BUN: 15 mg/dL (ref 8–27)
CO2: 22 mmol/L (ref 20–29)
Calcium: 9.5 mg/dL (ref 8.6–10.2)
Chloride: 94 mmol/L — ABNORMAL LOW (ref 96–106)
Creatinine, Ser: 0.65 mg/dL — ABNORMAL LOW (ref 0.76–1.27)
Glucose: 212 mg/dL — ABNORMAL HIGH (ref 65–99)
Potassium: 4.6 mmol/L (ref 3.5–5.2)
Sodium: 133 mmol/L — ABNORMAL LOW (ref 134–144)
eGFR: 98 mL/min/{1.73_m2} (ref >59)

## 2021-02-16 LAB — TSH: TSH: 1 u[IU]/mL (ref 0.450–4.500)

## 2021-02-19 ENCOUNTER — Encounter
Admission: RE | Disposition: A | Discharge status: Home / Self Care | Payer: Self-pay | Source: Home / Self Care | Attending: Cardiovascular Disease

## 2021-02-19 ENCOUNTER — Ambulatory Visit
Admission: RE | Admit: 2021-02-19 | Discharge: 2021-02-19 | Disposition: A | Discharge status: Home / Self Care | Payer: Medicare Other | Attending: Cardiovascular Disease | Admitting: Cardiovascular Disease

## 2021-02-19 ENCOUNTER — Ambulatory Visit: Payer: Medicare Other | Admitting: Anesthesiology

## 2021-02-19 ENCOUNTER — Encounter: Payer: Self-pay | Admitting: Cardiovascular Disease

## 2021-02-19 DIAGNOSIS — E119 Type 2 diabetes mellitus without complications: Secondary | ICD-10-CM | POA: Diagnosis not present

## 2021-02-19 DIAGNOSIS — M1990 Unspecified osteoarthritis, unspecified site: Secondary | ICD-10-CM | POA: Insufficient documentation

## 2021-02-19 DIAGNOSIS — I10 Essential (primary) hypertension: Secondary | ICD-10-CM | POA: Diagnosis not present

## 2021-02-19 DIAGNOSIS — Z79899 Other long term (current) drug therapy: Secondary | ICD-10-CM | POA: Diagnosis not present

## 2021-02-19 DIAGNOSIS — G4733 Obstructive sleep apnea (adult) (pediatric): Secondary | ICD-10-CM | POA: Diagnosis not present

## 2021-02-19 DIAGNOSIS — D509 Iron deficiency anemia, unspecified: Secondary | ICD-10-CM | POA: Insufficient documentation

## 2021-02-19 DIAGNOSIS — Z96643 Presence of artificial hip joint, bilateral: Secondary | ICD-10-CM | POA: Diagnosis not present

## 2021-02-19 DIAGNOSIS — Z96653 Presence of artificial knee joint, bilateral: Secondary | ICD-10-CM | POA: Diagnosis not present

## 2021-02-19 DIAGNOSIS — E785 Hyperlipidemia, unspecified: Secondary | ICD-10-CM | POA: Diagnosis not present

## 2021-02-19 DIAGNOSIS — Z6841 Body Mass Index (BMI) 40.0 and over, adult: Secondary | ICD-10-CM | POA: Insufficient documentation

## 2021-02-19 DIAGNOSIS — N400 Enlarged prostate without lower urinary tract symptoms: Secondary | ICD-10-CM | POA: Insufficient documentation

## 2021-02-19 DIAGNOSIS — I4819 Other persistent atrial fibrillation: Secondary | ICD-10-CM | POA: Diagnosis not present

## 2021-02-19 DIAGNOSIS — Z7901 Long term (current) use of anticoagulants: Secondary | ICD-10-CM | POA: Insufficient documentation

## 2021-02-19 DIAGNOSIS — E669 Obesity, unspecified: Secondary | ICD-10-CM | POA: Insufficient documentation

## 2021-02-19 DIAGNOSIS — Z7984 Long term (current) use of oral hypoglycemic drugs: Secondary | ICD-10-CM | POA: Diagnosis not present

## 2021-02-19 DIAGNOSIS — I4891 Unspecified atrial fibrillation: Secondary | ICD-10-CM | POA: Insufficient documentation

## 2021-02-19 HISTORY — PX: CARDIOVERSION: SHX1299

## 2021-02-19 LAB — GLUCOSE, CAPILLARY: Glucose-Capillary: 177 mg/dL — ABNORMAL HIGH (ref 70–99)

## 2021-02-19 SURGERY — CARDIOVERSION
Anesthesia: General

## 2021-02-19 MED ORDER — PROPOFOL 10 MG/ML IV BOLUS
INTRAVENOUS | Status: AC
  Filled 2021-02-19: qty 20

## 2021-02-19 MED ORDER — PROPOFOL 10 MG/ML IV BOLUS
INTRAVENOUS | Status: DC | PRN
  Administered 2021-02-19: 20 mg via INTRAVENOUS
  Administered 2021-02-19: 50 mg via INTRAVENOUS

## 2021-02-19 MED ORDER — PHENYLEPHRINE HCL (PRESSORS) 10 MG/ML IV SOLN
INTRAVENOUS | Status: AC
  Filled 2021-02-19: qty 1

## 2021-02-19 MED ORDER — SODIUM CHLORIDE 0.9 % IV SOLN: INTRAVENOUS | Status: DC

## 2021-02-19 NOTE — Anesthesia Preprocedure Evaluation (Signed)
Anesthesia Evaluation  Patient identified by MRN, date of birth, ID band Patient awake    Reviewed: Allergy & Precautions, NPO status , Patient's Chart, lab work & pertinent test results  History of Anesthesia Complications Negative for: history of anesthetic complications  Airway Mallampati: III  TM Distance: >3 FB Neck ROM: Full    Dental  (+) Caps   Pulmonary sleep apnea and Continuous Positive Airway Pressure Ventilation , neg COPD,    breath sounds clear to auscultation- rhonchi (-) wheezing      Cardiovascular hypertension, Pt. on medications (-) CAD, (-) Past MI, (-) Cardiac Stents and (-) CABG + dysrhythmias Atrial Fibrillation  Rhythm:Regular Rate:Normal - Systolic murmurs and - Diastolic murmurs    Neuro/Psych neg Seizures PSYCHIATRIC DISORDERS Anxiety negative neurological ROS     GI/Hepatic negative GI ROS, Neg liver ROS,   Endo/Other  diabetes, Oral Hypoglycemic Agents  Renal/GU negative Renal ROS     Musculoskeletal  (+) Arthritis ,   Abdominal (+) + obese,   Peds  Hematology  (+) anemia ,   Anesthesia Other Findings Past Medical History: No date: Anemia     Comment:  iron deficiency and b12 deficiency.  followed by               oncology No date: Anxiety No date: Arthritis 12/28/2020: Atrial fibrillation (HCC) No date: BPH (benign prostatic hyperplasia) No date: Diabetes mellitus No date: Diverticulosis No date: Elevated platelet count No date: Hemorrhoids No date: HOH (hard of hearing)     Comment:  wears hearing aides No date: Hypertension No date: OSA on CPAP No date: Prostate disorder   Reproductive/Obstetrics                             Anesthesia Physical Anesthesia Plan  ASA: 3  Anesthesia Plan: General   Post-op Pain Management:    Induction: Intravenous  PONV Risk Score and Plan: 1 and Propofol infusion  Airway Management Planned: Natural  Airway  Additional Equipment:   Intra-op Plan:   Post-operative Plan:   Informed Consent: I have reviewed the patients History and Physical, chart, labs and discussed the procedure including the risks, benefits and alternatives for the proposed anesthesia with the patient or authorized representative who has indicated his/her understanding and acceptance.     Dental advisory given  Plan Discussed with: CRNA and Anesthesiologist  Anesthesia Plan Comments:         Anesthesia Quick Evaluation

## 2021-02-19 NOTE — CV Procedure (Signed)
Cardioversion note: A standard informed consent was obtained. Timeout was performed. The pads were placed in the anterior posterior fashion. The patient was given propofol by the anesthesia team.  Successful cardioversion was performed with a 200 J. The patient converted to sinus rhythm. Pre-and post EKGs were reviewed. The patient tolerated the procedure with no immediate complications.  Recommendations: Continue same medications and follow-up in 2-3 weeks.  

## 2021-02-19 NOTE — Anesthesia Postprocedure Evaluation (Signed)
Anesthesia Post Note  Patient: [name] [name]  Procedure(s) Performed: CARDIOVERSION  Patient location during evaluation: Specials Recovery (specials recovery) Anesthesia Type: General Level of consciousness: awake and alert and oriented Pain management: pain level controlled Vital Signs Assessment: post-procedure vital signs reviewed and stable Respiratory status: spontaneous breathing, nonlabored ventilation and respiratory function stable Cardiovascular status: blood pressure returned to baseline and stable Postop Assessment: no signs of nausea or vomiting Anesthetic complications: no   No notable events documented.   Last Vitals:  Vitals:   02/19/21 0740 02/19/21 0750  BP:  (!) 147/96  Pulse: 79   Resp: 16   Temp:    SpO2: 93%     Last Pain:  Vitals:   02/19/21 0750  TempSrc:   PainSc: 0-No pain                 Ismeal Heider

## 2021-02-19 NOTE — Transfer of Care (Signed)
Immediate Anesthesia Transfer of Care Note  Patient: [name] [name]  Procedure(s) Performed: CARDIOVERSION  Patient Location: PACU  Anesthesia Type:General  Level of Consciousness: awake, alert  and oriented  Airway & Oxygen Therapy: Patient Spontanous Breathing and Patient connected to nasal cannula oxygen  Post-op Assessment: Report given to RN and Post -op Vital signs reviewed and stable  Post vital signs: Reviewed and stable  Last Vitals:  Vitals Value Taken Time  BP 150/86 02/19/21 0738  Temp    Pulse 78 02/19/21 0740  Resp 19 02/19/21 0740  SpO2 92 % 02/19/21 0740  Vitals shown include unvalidated device data.  Last Pain:  Vitals:   02/19/21 0654  TempSrc: Oral         Complications: No notable events documented.

## 2021-02-19 NOTE — Interval H&P Note (Signed)
History and Physical Interval Note:  02/19/2021 7:48 AM  [name] [name]  has presented today for surgery, with the diagnosis of Cardioversion   AFib.  The various methods of treatment have been discussed with the patient and family. After consideration of risks, benefits and other options for treatment, the patient has consented to  Procedure(s): CARDIOVERSION (N/A) as a surgical intervention.  The patient's history has been reviewed, patient examined, no change in status, stable for surgery.  I have reviewed the patient's chart and labs.  Questions were answered to the patient's satisfaction.     [name]

## 2021-03-01 ENCOUNTER — Other Ambulatory Visit: Payer: Self-pay | Admitting: Urology

## 2021-03-02 ENCOUNTER — Encounter: Payer: Self-pay | Admitting: Urology

## 2021-03-07 ENCOUNTER — Encounter: Payer: Self-pay | Admitting: Urology

## 2021-03-07 ENCOUNTER — Other Ambulatory Visit: Payer: Self-pay

## 2021-03-07 ENCOUNTER — Ambulatory Visit: Payer: Medicare Other | Admitting: Urology

## 2021-03-07 VITALS — BP 143/76 | HR 92 | Ht 70.0 in | Wt 284.0 lb

## 2021-03-07 DIAGNOSIS — N401 Enlarged prostate with lower urinary tract symptoms: Secondary | ICD-10-CM | POA: Diagnosis not present

## 2021-03-07 DIAGNOSIS — N433 Hydrocele, unspecified: Secondary | ICD-10-CM

## 2021-03-07 LAB — BLADDER SCAN AMB NON-IMAGING: Scan Result: 30

## 2021-03-07 MED ORDER — MIRABEGRON ER 50 MG PO TB24
50.0000 mg | ORAL_TABLET | Freq: Every day | ORAL | 0 refills | Status: DC
Start: 2021-03-07 — End: 2022-10-03

## 2021-03-07 MED ORDER — TAMSULOSIN HCL 0.4 MG PO CAPS: ORAL_CAPSULE | ORAL | 3 refills | Status: DC

## 2021-03-07 NOTE — Progress Notes (Signed)
03/07/2021 1:13 PM   [name] [name] 10/24/1945 [id number]  Referring provider: Unice Bailey, MD [address] [place],  [place] [address]  Chief Complaint  Patient presents with   Follow-up    HPI: 75 y.o. male presents for 71-monthfollow-up.  Refer to my prior note 07/12/2020 for clinical summary He sent a MyChart message last week stating he had hip replacement surgery July 2022 and since that surgery complains of incomplete bladder emptying with nocturia x4 with urgency and urge incontinence Hydrocele contributes to voiding difficulty due to penile retraction and he is requesting to be scheduled for aspiration Feels voiding pattern has improved with increasing tamsulosin to 0.8 mg   PMH: Past Medical History:  Diagnosis Date   Anemia    iron deficiency and b12 deficiency.  followed by oncology   Anxiety    Arthritis    Atrial fibrillation ([place] 12/28/2020   BPH (benign prostatic hyperplasia)    Diabetes mellitus    Diverticulosis    Elevated platelet count    Hemorrhoids    HOH (hard of hearing)    wears hearing aides   Hypertension    OSA on CPAP    Prostate disorder     Surgical History: Past Surgical History:  Procedure Laterality Date   CARDIOVERSION N/A 02/19/2021   Procedure: CARDIOVERSION;  Surgeon: AWellington Hampshire MD;  Location: [facility];  Service: Cardiovascular;  Laterality: N/A;   CATARACT EXTRACTION W/PHACO Left 07/14/2018   Procedure: CATARACT EXTRACTION PHACO AND INTRAOCULAR LENS PLACEMENT ([place], DIABETIC;  Surgeon: PBirder Robson MD;  Location: [facility];  Service: Ophthalmology;  Laterality: Left;  [place]50 CDE 8.93 Fluid pack lot # [id number]   CATARACT EXTRACTION W/PHACO Right 09/01/2018   Procedure: CATARACT EXTRACTION PHACO AND INTRAOCULAR LENS PLACEMENT (IOC)-RIGHT, DIABETIC;  Surgeon: PBirder Robson MD;  Location: [facility];  Service: Ophthalmology;  Laterality: Right;  [place]50.9 CDE 6.80 Fluid Pack Lot #  [id number]     COLONOSCOPY     CYSTOSCOPY WITH INSERTION OF UROLIFT     CYSTOSCOPY WITH INSERTION OF UROLIFT  08/2018   ESOPHAGOGASTRODUODENOSCOPY (EGD) WITH PROPOFOL N/A 03/17/2019   Procedure: ESOPHAGOGASTRODUODENOSCOPY (EGD) WITH PROPOFOL;  Surgeon: Toledo, TBenay Pike MD;  Location: [facility];  Service: Gastroenterology;  Laterality: N/A;   EYE SURGERY Bilateral    cataract surgery   JOINT REPLACEMENT Bilateral 2014   Total knee replacement.   REPLACEMENT TOTAL KNEE Bilateral    TOTAL HIP ARTHROPLASTY Left 02/23/2020   Procedure: TOTAL HIP ARTHROPLASTY;  Surgeon: HDereck Leep MD;  Location: [facility];  Service: Orthopedics;  Laterality: Left;   TOTAL HIP ARTHROPLASTY Right 01/10/2021   Procedure: TOTAL HIP ARTHROPLASTY;  Surgeon: HDereck Leep MD;  Location: [facility];  Service: Orthopedics;  Laterality: Right;    Home Medications:  Allergies as of 03/07/2021   No Known Allergies      Medication List        Accurate as of March 07, 2021  1:13 PM. If you have any questions, ask your nurse or doctor.          acetaminophen 650 MG CR tablet Commonly known as: TYLENOL Take 1,300 mg by mouth every 8 (eight) hours as needed for pain.   ALPRAZolam 0.5 MG tablet Commonly known as: XANAX Take 1 mg by mouth at bedtime.   amLODipine 10 MG tablet Commonly known as: NORVASC Take 1 tablet (10 mg total) by mouth daily.   amoxicillin 500 MG capsule Commonly known  as: AMOXIL Take 2,000 mg by mouth as needed (Prior to dental procedures).   apixaban 5 MG Tabs tablet Commonly known as: ELIQUIS Take 1 tablet (5 mg total) by mouth 2 (two) times daily.   benazepril-hydrochlorthiazide 20-12.5 MG tablet Commonly known as: LOTENSIN HCT Take 1 tablet by mouth every evening.   carvedilol 3.125 MG tablet Commonly known as: Coreg Take 1 tablet (3.125 mg total) by mouth 2 (two) times daily with a meal.   Geritol Liqd Take 1 oz by mouth daily.   glimepiride 1 MG  tablet Commonly known as: AMARYL Take 1 mg by mouth daily with breakfast.   metFORMIN 500 MG 24 hr tablet Commonly known as: GLUCOPHAGE-XR Take 1,000 mg by mouth daily with supper.   mirabegron ER 50 MG Tb24 tablet Commonly known as: MYRBETRIQ Take 1 tablet (50 mg total) by mouth daily. Started by: Abbie Sons, MD   naphazoline-glycerin 0.012-0.25 % Soln Commonly known as: CLEAR EYES REDNESS Place 1-2 drops into both eyes 4 (four) times daily as needed for eye irritation.   pravastatin 10 MG tablet Commonly known as: PRAVACHOL Take 10 mg by mouth daily with supper.   Rybelsus 14 MG Tabs Generic drug: Semaglutide Take 14 mg by mouth every morning.   sertraline 50 MG tablet Commonly known as: ZOLOFT Take 50 mg by mouth every morning.   tamsulosin 0.4 MG Caps capsule Commonly known as: FLOMAX TAKE 2 CAPSULES BY MOUTH EVERY DAY AFTERBREAKFAST   traMADol 50 MG tablet Commonly known as: ULTRAM Take 1 tablet (50 mg total) by mouth every 4 (four) hours as needed for moderate pain.   vitamin B-12 1000 MCG tablet Commonly known as: CYANOCOBALAMIN Take 1,000 mcg by mouth daily.   zinc gluconate 50 MG tablet Take 50 mg by mouth daily.        Allergies: No Known Allergies  Family History: Family History  Problem Relation Age of Onset   Diabetes Mother    Alzheimer's disease Mother    Colon cancer Sister     Social History:  reports that he has never smoked. He has never used smokeless tobacco. He reports current alcohol use. He reports that he does not use drugs.   Physical Exam: BP (!) 143/76   Pulse 92   Ht '5\' 10"'$  (1.778 m)   Wt 284 lb (128.8 kg)   BMI 40.75 kg/m   Constitutional:  Alert and oriented, No acute distress. HEENT: [place] AT, moist mucus membranes.  Trachea midline, no masses. Cardiovascular: No clubbing, cyanosis, or edema. Respiratory: Normal respiratory effort, no increased work of breathing. GI: Abdomen is soft, nontender, nondistended, no  abdominal masses GU: Moderate left hydrocele Lymph: No cervical or inguinal lymphadenopathy. Skin: No rashes, bruises or suspicious lesions. Neurologic: Grossly intact, no focal deficits, moving all 4 extremities. Psychiatric: Normal mood and affect.    Assessment & Plan:    1.  BPH with LUTS Bothersome storage related voiding symptoms Bladder scan PVR 30 mL Obstructive symptoms improved with tamsulosin titration Trial Myrbetriq 50 mg daily.  Samples given x28 days and he was instructed to call back regarding efficacy  2.  Left hydrocele Schedule aspiration under ultrasound guidance   Abbie Sons, MD  [facility] [address] [place],  [address] [phone]

## 2021-03-11 NOTE — Progress Notes (Signed)
Cardiology Office Note    Date:  03/16/2021   ID:  [name] [name], DOB 01-11-1946, MRN [id number]  PCP:  [name] Bailey, MD  Cardiologist:  [name] Sacramento, MD  Electrophysiologist:  None   Chief Complaint: Follow-up  History of Present Illness:   [name] [name] is a 75 y.o. male with history of recently diagnosed A. fib of uncertain chronicity status post DCCV on 02/19/2021, DM2, HTN, HLD, iron deficiency anemia requiring IV iron, osteoarthritis, diverticulosis, OSA on CPAP, BPH, and obesity who presents for follow-up of DCCV.   He was seen as a new patient by Dr. Fletcher Anon on 01/02/2021 for preoperative cardiovascular risk stratification before total hip arthroplasty given preop EKG 12/28/2020 showed newly diagnosed A. fib.  At his visit with our office on 7/5, he was not aware of any prior history of arrhythmia.  He had no previously known cardiac history.  He denied any symptoms of angina or decompensation.  He remained in A. fib with ventricular rates in the 90s bpm.  Given his CHA2DS2-VASc of 4, initiation of Eliquis was recommended following hip surgery.  He was not requiring rate controlling medications.  Echo on 01/03/2021 demonstrated an EF of 55 to 60%, no regional wall motion abnormalities, indeterminate LV diastolic function parameters, normal RV systolic function and ventricular cavity size, mildly dilated left atrium, trivial mitral regurgitation, and an estimated right atrial pressure of 3 mmHg.  He was seen in follow-up on 02/08/2021 and was doing well from a cardiac perspective.  He remained in A. fib with controlled ventricular response.  He was started on carvedilol 3.125 mg twice daily.  He had been adherent to anticoagulation.  He subsequently underwent successful DCCV on 02/19/2021.   He comes in today doing well.  He does continue to note some fatigue, though activities are slowly improving to a degree.  He continues to need to stop after walking up an incline to rest.  No falls,  hematochezia, or melena.  Tolerating apixaban without issues.  No angina, dyspnea, palpitations, dizziness, presyncope, syncope, or lower extremity swelling.     Labs independently reviewed: 01/2021 - A1c 8.0, albumin 4.2, AST/ALT normal, TC 144, TG 78, HDL 54, LDL 74, Hgb 9.6, PLT 49, TSH normal, BUN 15, serum creatinine 0.65, potassium 4.6    Past Medical History:  Diagnosis Date   Anemia    iron deficiency and b12 deficiency.  followed by oncology   Anxiety    Arthritis    Atrial fibrillation ([place]) 12/28/2020   BPH (benign prostatic hyperplasia)    Diabetes mellitus    Diverticulosis    Elevated platelet count    Hemorrhoids    HOH (hard of hearing)    wears hearing aides   Hypertension    OSA on CPAP    Prostate disorder     Past Surgical History:  Procedure Laterality Date   CARDIOVERSION N/A 02/19/2021   Procedure: CARDIOVERSION;  Surgeon: Wellington Hampshire, MD;  Location: [facility];  Service: Cardiovascular;  Laterality: N/A;   CATARACT EXTRACTION W/PHACO Left 07/14/2018   Procedure: CATARACT EXTRACTION PHACO AND INTRAOCULAR LENS PLACEMENT ([place]) LEFT, DIABETIC;  Surgeon: Birder Robson, MD;  Location: [facility];  Service: Ophthalmology;  Laterality: Left;  [place] 00:50 CDE 8.93 Fluid pack lot # [id number]   CATARACT EXTRACTION W/PHACO Right 09/01/2018   Procedure: CATARACT EXTRACTION PHACO AND INTRAOCULAR LENS PLACEMENT (IOC)-RIGHT, DIABETIC;  Surgeon: Birder Robson, MD;  Location: [facility];  Service: Ophthalmology;  Laterality: Right;  [place] 00:50.9 CDE 6.80  Fluid Pack Lot # [id number]     COLONOSCOPY     CYSTOSCOPY WITH INSERTION OF UROLIFT     CYSTOSCOPY WITH INSERTION OF UROLIFT  08/2018   ESOPHAGOGASTRODUODENOSCOPY (EGD) WITH PROPOFOL N/A 03/17/2019   Procedure: ESOPHAGOGASTRODUODENOSCOPY (EGD) WITH PROPOFOL;  Surgeon: Toledo, Benay Pike, MD;  Location: [facility];  Service: Gastroenterology;  Laterality: N/A;   EYE SURGERY Bilateral    cataract surgery   JOINT  REPLACEMENT Bilateral 2014   Total knee replacement.   REPLACEMENT TOTAL KNEE Bilateral    TOTAL HIP ARTHROPLASTY Left 02/23/2020   Procedure: TOTAL HIP ARTHROPLASTY;  Surgeon: Dereck Leep, MD;  Location: [facility];  Service: Orthopedics;  Laterality: Left;   TOTAL HIP ARTHROPLASTY Right 01/10/2021   Procedure: TOTAL HIP ARTHROPLASTY;  Surgeon: Dereck Leep, MD;  Location: [facility];  Service: Orthopedics;  Laterality: Right;    Current Medications: Current Meds  Medication Sig   acetaminophen (TYLENOL) 650 MG CR tablet Take 1,300 mg by mouth every 8 (eight) hours as needed for pain.   ALPRAZolam (XANAX) 0.5 MG tablet Take 1 mg by mouth at bedtime.   amLODipine (NORVASC) 10 MG tablet Take 1 tablet (10 mg total) by mouth daily.   amoxicillin (AMOXIL) 500 MG capsule Take 2,000 mg by mouth as needed (Prior to dental procedures).   apixaban (ELIQUIS) 5 MG TABS tablet Take 1 tablet (5 mg total) by mouth 2 (two) times daily.   benazepril-hydrochlorthiazide (LOTENSIN HCT) 20-12.5 MG tablet Take 1 tablet by mouth every evening.   carvedilol (COREG) 6.25 MG tablet Take 1 tablet (6.25 mg total) by mouth 2 (two) times daily.   glimepiride (AMARYL) 1 MG tablet Take 1 mg by mouth daily with breakfast.   Iron-Vitamins (GERITOL PO) Take by mouth as needed.   metFORMIN (GLUCOPHAGE-XR) 500 MG 24 hr tablet Take 1,000 mg by mouth daily with supper.    mirabegron ER (MYRBETRIQ) 50 MG TB24 tablet Take 1 tablet (50 mg total) by mouth daily.   naphazoline-glycerin (CLEAR EYES REDNESS) 0.012-0.25 % SOLN Place 1-2 drops into both eyes 4 (four) times daily as needed for eye irritation.   OVER THE COUNTER MEDICATION Blood builder-1 capsule by mouth daily   pravastatin (PRAVACHOL) 10 MG tablet Take 10 mg by mouth daily with supper.    Semaglutide (RYBELSUS) 14 MG TABS Take 14 mg by mouth every morning.   sertraline (ZOLOFT) 50 MG tablet Take 50 mg by mouth every morning.   tamsulosin (FLOMAX) 0.4 MG CAPS capsule  TAKE 2 CAPSULES BY MOUTH EVERY DAY AFTERBREAKFAST   traMADol (ULTRAM) 50 MG tablet Take 1 tablet (50 mg total) by mouth every 4 (four) hours as needed for moderate pain.   vitamin B-12 (CYANOCOBALAMIN) 1000 MCG tablet Take 1,000 mcg by mouth daily.   zinc gluconate 50 MG tablet Take 50 mg by mouth daily.   [DISCONTINUED] carvedilol (COREG) 3.125 MG tablet Take 1 tablet (3.125 mg total) by mouth 2 (two) times daily with a meal.    Allergies:   Patient has no known allergies.   Social History   Socioeconomic History   Marital status: Married    Spouse name: Bonnita Nasuti   Number of children: 3   Years of education: Not on file   Highest education level: Not on file  Occupational History   Occupation: Higher education careers adviser, Clinical biochemist    Comment: retired  Tobacco Use   Smoking status: Never   Smokeless tobacco: Never  Vaping Use   Vaping Use: Never used  Substance and Sexual Activity   Alcohol use: Yes    Comment: wine-occasionally Previously drank a shot of vodka in the evenings   Drug use: Never   Sexual activity: Not on file  Other Topics Concern   Not on file  Social History Narrative   Patient lives with wife.  Feels safe   Social Determinants of Radio broadcast assistant Strain: Not on file  Food Insecurity: Not on file  Transportation Needs: Not on file  Physical Activity: Not on file  Stress: Not on file  Social Connections: Not on file     Family History:  The patient's family history includes Alzheimer's disease in his mother; Colon cancer in his sister; Diabetes in his mother.  ROS:   Review of Systems  Constitutional:  Positive for malaise/fatigue. Negative for chills, diaphoresis, fever and weight loss.  HENT:  Negative for congestion.   Eyes:  Negative for discharge and redness.  Respiratory:  Negative for cough, sputum production, shortness of breath and wheezing.   Cardiovascular:  Negative for chest pain, palpitations, orthopnea, claudication, leg swelling and PND.   Gastrointestinal:  Negative for abdominal pain, blood in stool, heartburn, melena, nausea and vomiting.  Musculoskeletal:  Negative for falls and myalgias.  Skin:  Negative for rash.  Neurological:  Negative for dizziness, tingling, tremors, sensory change, speech change, focal weakness, loss of consciousness and weakness.  Endo/Heme/Allergies:  Does not bruise/bleed easily.  Psychiatric/Behavioral:  Negative for substance abuse. The patient is not nervous/anxious.   All other systems reviewed and are negative.   EKGs/Labs/Other Studies Reviewed:    Studies reviewed were summarized above. The additional studies were reviewed today:  2D echo 01/03/2021: 1. Left ventricular ejection fraction, by estimation, is 55 to 60%. The  left ventricle has normal function. The left ventricle has no regional  wall motion abnormalities. Left ventricular diastolic parameters are  indeterminate.   2. Right ventricular systolic function is normal. The right ventricular  size is normal.   3. Left atrial size was mildly dilated.   4. The mitral valve is normal in structure. Trivial mitral valve  regurgitation.   5. The aortic valve is tricuspid. Aortic valve regurgitation is not  visualized.   6. The inferior vena cava is normal in size with greater than 50%  respiratory variability, suggesting right atrial pressure of 3 mmHg.     EKG:  EKG is ordered today.  The EKG ordered today demonstrates NSR, 78 bpm, first-degree AV block, frequent PVCs in a pattern of ventricular trigeminy, no acute ST-T changes  Recent Labs: 12/28/2020: ALT 12 02/08/2021: TSH 1.000 03/16/2021: BUN 14; Creatinine, Ser 0.81; Hemoglobin 11.0; Platelets 394; Potassium 4.3; Sodium 131  Recent Lipid Panel    Component Value Date/Time   CHOL 170 12/20/2014 1238   TRIG 75 12/20/2014 1238   HDL 65 12/20/2014 1238   CHOLHDL 2.6 12/20/2014 1238   LDLCALC 90 12/20/2014 1238    PHYSICAL EXAM:    VS:  BP 130/70 (BP Location: Left  Arm, Patient Position: Sitting, Cuff Size: Large)   Pulse 78   Ht '5\' 10"'$  (1.778 m)   Wt 285 lb (129.3 kg)   SpO2 98%   BMI 40.89 kg/m   BMI: Body mass index is 40.89 kg/m.  Physical Exam Vitals reviewed.  Constitutional:      Appearance: He is well-developed.  HENT:     Head: Normocephalic and atraumatic.  Eyes:     General:  Right eye: No discharge.        Left eye: No discharge.  Neck:     Vascular: No JVD.  Cardiovascular:     Rate and Rhythm: Normal rate and regular rhythm. No extrasystoles are present.    Pulses:          Posterior tibial pulses are 2+ on the right side and 2+ on the left side.     Heart sounds: Normal heart sounds, S1 normal and S2 normal. Heart sounds not distant. No midsystolic click and no opening snap. No murmur heard.   No friction rub.  Pulmonary:     Effort: Pulmonary effort is normal. No respiratory distress.     Breath sounds: Normal breath sounds. No decreased breath sounds, wheezing or rales.  Chest:     Chest wall: No tenderness.  Abdominal:     General: There is no distension.     Palpations: Abdomen is soft.     Tenderness: There is no abdominal tenderness.  Musculoskeletal:     Cervical back: Normal range of motion.     Right lower leg: No edema.     Left lower leg: No edema.  Skin:    General: Skin is warm and dry.     Nails: There is no clubbing.  Neurological:     Mental Status: He is alert and oriented to person, place, and time.  Psychiatric:        Speech: Speech normal.        Behavior: Behavior normal.        Thought Content: Thought content normal.        Judgment: Judgment normal.    Wt Readings from Last 3 Encounters:  03/16/21 285 lb (129.3 kg)  03/07/21 284 lb (128.8 kg)  02/19/21 281 lb (127.5 kg)     ASSESSMENT & PLAN:   Persistent A. fib: Maintaining sinus rhythm with frequent PVCs.  Titrate carvedilol to 6.25 mg twice daily given PVC burden.  Given a CHA2DS2-VASc of at least 4 (HTN, age x2, DM) he  remains on indefinite apixaban.  No symptoms concerning for bleeding.  Stable renal function and improved hemoglobin on labs obtained earlier today.  Frequent PVCs: Likely contributing to his ongoing fatigue.  Place Zio patch for 3 days to quantify PVC burden.  Titrate carvedilol to 6.25 mg twice daily.  If further escalation of beta-blocker is indicated, could decrease amlodipine and titrate beta-blocker as heart rate allows.  Recent echo demonstrating preserved LV systolic function as outlined above.  Potassium at goal.  Thyroid function normal.  Based on PVC burden, consider adding magnesium along with possible ischemic evaluation.  HTN: Blood pressure is reasonably controlled in the office today.  Continue titrated dose of carvedilol along with continuation of Lotensin HCT and amlodipine.  HLD: LDL 82.  He remains on pravastatin.  Iron deficiency anemia: Improving with oral iron.  No symptoms concerning for bleeding.  OSA: Uses a CPAP nightly.  This was not discussed in detail at today's visit.  Disposition: F/u with Dr. Fletcher Anon or an APP in 4 to 6 weeks.   Medication Adjustments/Labs and Tests Ordered: Current medicines are reviewed at length with the patient today.  Concerns regarding medicines are outlined above. Medication changes, Labs and Tests ordered today are summarized above and listed in the Patient Instructions accessible in Encounters.   Signed, Christell Faith, PA-C 03/16/2021 12:57 PM     [place], [place]  [address] [phone]  

## 2021-03-13 ENCOUNTER — Other Ambulatory Visit: Payer: Self-pay | Admitting: Cardiovascular Disease

## 2021-03-13 NOTE — Telephone Encounter (Signed)
Eliquis '5mg'$  refill request received. Patient is 75 years old, weight-128.8kg, Crea-0.65 on 02/08/2021, Diagnosis-Afib, and last seen by Christell Faith on 02/08/2021. Dose is appropriate based on dosing criteria. Will send in refill to requested pharmacy.

## 2021-03-13 NOTE — Telephone Encounter (Signed)
Refill request

## 2021-03-15 DIAGNOSIS — I4819 Other persistent atrial fibrillation: Secondary | ICD-10-CM

## 2021-03-16 ENCOUNTER — Encounter: Payer: Self-pay | Admitting: Physician Assistant

## 2021-03-16 ENCOUNTER — Other Ambulatory Visit: Payer: Self-pay

## 2021-03-16 ENCOUNTER — Ambulatory Visit: Payer: Medicare Other | Admitting: Physician Assistant

## 2021-03-16 ENCOUNTER — Ambulatory Visit (INDEPENDENT_AMBULATORY_CARE_PROVIDER_SITE_OTHER): Payer: Medicare Other

## 2021-03-16 ENCOUNTER — Other Ambulatory Visit
Admission: RE | Admit: 2021-03-16 | Discharge: 2021-03-16 | Disposition: A | Discharge status: Home / Self Care | Payer: Medicare Other | Source: Ambulatory Visit | Attending: Physician Assistant | Admitting: Physician Assistant

## 2021-03-16 VITALS — BP 130/70 | HR 78 | Ht 70.0 in | Wt 285.0 lb

## 2021-03-16 DIAGNOSIS — I4819 Other persistent atrial fibrillation: Secondary | ICD-10-CM | POA: Insufficient documentation

## 2021-03-16 DIAGNOSIS — E782 Mixed hyperlipidemia: Secondary | ICD-10-CM

## 2021-03-16 DIAGNOSIS — G4733 Obstructive sleep apnea (adult) (pediatric): Secondary | ICD-10-CM

## 2021-03-16 DIAGNOSIS — I493 Ventricular premature depolarization: Secondary | ICD-10-CM

## 2021-03-16 DIAGNOSIS — I10 Essential (primary) hypertension: Secondary | ICD-10-CM

## 2021-03-16 DIAGNOSIS — D509 Iron deficiency anemia, unspecified: Secondary | ICD-10-CM

## 2021-03-16 DIAGNOSIS — Z9989 Dependence on other enabling machines and devices: Secondary | ICD-10-CM

## 2021-03-16 LAB — CBC WITH DIFFERENTIAL/PLATELET
Abs Immature Granulocytes: 0.04 10*3/uL (ref 0.00–0.07)
Basophils Absolute: 0.1 10*3/uL (ref 0.0–0.1)
Basophils Relative: 1 %
Eosinophils Absolute: 0.3 10*3/uL (ref 0.0–0.5)
Eosinophils Relative: 3 %
HCT: 33.9 % — ABNORMAL LOW (ref 39.0–52.0)
Hemoglobin: 11 g/dL — ABNORMAL LOW (ref 13.0–17.0)
Immature Granulocytes: 0 %
Lymphocytes Relative: 19 %
Lymphs Abs: 1.8 10*3/uL (ref 0.7–4.0)
MCH: 28.2 pg (ref 26.0–34.0)
MCHC: 32.4 g/dL (ref 30.0–36.0)
MCV: 86.9 fL (ref 80.0–100.0)
Monocytes Absolute: 0.9 10*3/uL (ref 0.1–1.0)
Monocytes Relative: 9 %
Neutro Abs: 6.4 10*3/uL (ref 1.7–7.7)
Neutrophils Relative %: 68 %
Platelets: 394 10*3/uL (ref 150–400)
RBC: 3.9 MIL/uL — ABNORMAL LOW (ref 4.22–5.81)
RDW: 16.8 % — ABNORMAL HIGH (ref 11.5–15.5)
WBC: 9.4 10*3/uL (ref 4.0–10.5)
nRBC: 0 % (ref 0.0–0.2)

## 2021-03-16 LAB — BASIC METABOLIC PANEL
Anion gap: 11 (ref 5–15)
BUN: 14 mg/dL (ref 8–23)
CO2: 25 mmol/L (ref 22–32)
Calcium: 9.2 mg/dL (ref 8.9–10.3)
Chloride: 95 mmol/L — ABNORMAL LOW (ref 98–111)
Creatinine, Ser: 0.81 mg/dL (ref 0.61–1.24)
GFR, Estimated: >60 mL/min (ref >60)
Glucose, Bld: 240 mg/dL — ABNORMAL HIGH (ref 70–99)
Potassium: 4.3 mmol/L (ref 3.5–5.1)
Sodium: 131 mmol/L — ABNORMAL LOW (ref 135–145)

## 2021-03-16 MED ORDER — CARVEDILOL 6.25 MG PO TABS: 6.2500 mg | ORAL_TABLET | Freq: Two times a day (BID) | ORAL | 0 refills | Status: DC

## 2021-03-16 NOTE — Patient Instructions (Addendum)
Medication Instructions:   INCREASE carvedilol to 6.'25mg'$  twice daily  *If you need a refill on your cardiac medications before your next appointment, please call your pharmacy*   Lab Work: None ordered  If you have labs (blood work) drawn today and your tests are completely normal, you will receive your results only by: [place] (if you have MyChart) OR A paper copy in the mail If you have any lab test that is abnormal or we need to change your treatment, we will call you to review the results.   Testing/Procedures: Your physician has recommended that you wear a Zio XT monitor for 3 days.   This monitor is a medical device that records the heart's electrical activity. Doctors most often use these monitors to diagnose arrhythmias. Arrhythmias are problems with the speed or rhythm of the heartbeat. The monitor is a small device applied to your chest. You can wear one while you do your normal daily activities. While wearing this monitor if you have any symptoms to push the button and record what you felt. Once you have worn this monitor for the period of time provider prescribed (Usually 14 days), you will return the monitor device in the postage paid box. Once it is returned they will download the data collected and provide [place] with a report which the provider will then review and we will call you with those results. Important tips:  Avoid showering during the first 24 hours of wearing the monitor. Avoid excessive sweating to help maximize wear time. Do not submerge the device, no hot tubs, and no swimming pools. Keep any lotions or oils away from the patch. After 24 hours you may shower with the patch on. Take brief showers with your back facing the shower head.  Do not remove patch once it has been placed because that will interrupt data and decrease adhesive wear time. Push the button when you have any symptoms and write down what you were feeling. Once you have completed wearing  your monitor, remove and place into box which has postage paid and place in your outgoing mailbox.  If for some reason you have misplaced your box then call our office and we can provide another box and/or mail it off for you.  Follow-Up: At [facility], you and your health needs are our priority.  As part of our continuing mission to provide you with exceptional heart care, we have created designated Provider Care Teams.  These Care Teams include your primary Cardiologist (physician) and Advanced Practice Providers (APPs -  Physician Assistants and Nurse Practitioners) who all work together to provide you with the care you need, when you need it.  We recommend signing up for the patient portal called "MyChart".  Sign up information is provided on this After Visit Summary.  MyChart is used to connect with patients for Virtual Visits (Telemedicine).  Patients are able to view lab/test results, encounter notes, upcoming appointments, etc.  Non-urgent messages can be sent to your provider as well.   To learn more about what you can do with MyChart, go to [email or web address].    Your next appointment:   4-6 week(s)  The format for your next appointment:   In Person  Provider:   You may see [name], MD or one of the following Advanced Practice Providers on your designated Care Team:   Murray Hodgkins, NP Christell Faith, PA-C Marrianne Mood, PA-C Cadence Cyr, [place]

## 2021-03-26 DIAGNOSIS — I471 Supraventricular tachycardia, unspecified: Secondary | ICD-10-CM

## 2021-03-26 HISTORY — DX: Supraventricular tachycardia, unspecified: I47.10

## 2021-03-27 ENCOUNTER — Telehealth: Payer: Self-pay

## 2021-03-27 DIAGNOSIS — Z79899 Other long term (current) drug therapy: Secondary | ICD-10-CM

## 2021-03-27 NOTE — Telephone Encounter (Signed)
Was able to reach out to pt via phone and make contact to review their recent ZIO monitor results. [name] [name], advised based on the current results   Please inform the patient his cardiac monitor showed a predominant rhythm of sinus with an average heart rate of 81 bpm (range 63-121 bpm). First degree AV block was noted. 1 run of SVT occurred lasting 4 beats. There were frequent PVCs noted with a burden of 9.6%.   Recommendations:  -Prefer to hold off on titration of beta blocker at this time given noted 1st degree AV block on monitor  -At his convenience, please have him come in for a magnesium level  -Please have him come me on 10/10   Pt verbalized understanding, is thankful for the results call, appt made for 10/10 at 08:30 am with Thurmond Butts, pt will also come in the office for mag level on 10/6 at 1:30 pm. Otherwise all questions and concerns were address. Will call back for anything further, f/u as schedule.

## 2021-03-29 ENCOUNTER — Other Ambulatory Visit: Payer: Self-pay

## 2021-03-29 ENCOUNTER — Encounter: Payer: Self-pay | Admitting: Urology

## 2021-03-29 ENCOUNTER — Ambulatory Visit: Payer: Medicare Other | Admitting: Urology

## 2021-03-29 VITALS — BP 146/82 | HR 91 | Ht 70.0 in | Wt 285.0 lb

## 2021-03-29 DIAGNOSIS — N433 Hydrocele, unspecified: Secondary | ICD-10-CM | POA: Diagnosis not present

## 2021-03-29 NOTE — Progress Notes (Signed)
Procedure note  Diagnosis: Left hydrocele  Procedure: Aspiration left hydrocele  Indications: Symptomatic left hydrocele  Anesthesia: 1% plain Xylocaine  Description: Patient was prepped and draped sterilely.  Scrotal ultrasound was performed with moderate hydrocele noted.  The anterior scrotal skin was anesthetized with 1% plain Xylocaine.  Under ultrasound guidance a 18-gauge Angiocath was inserted through the anesthetized area.  The needle was removed and 150 mL of straw-colored fluid was aspirated.  Repeat ultrasound post aspiration with minimal hydrocele fluid  Plan: Instructed to call for scrotal erythema, drainage or pain States his voiding symptoms improved with treatment of his neuropathy and he does not desire to continue Myrbetriq He will continue tamsulosin Follow-up 6 months   John Giovanni, MD

## 2021-04-05 ENCOUNTER — Other Ambulatory Visit (INDEPENDENT_AMBULATORY_CARE_PROVIDER_SITE_OTHER): Payer: Medicare Other

## 2021-04-05 ENCOUNTER — Other Ambulatory Visit: Payer: Self-pay

## 2021-04-05 DIAGNOSIS — Z79899 Other long term (current) drug therapy: Secondary | ICD-10-CM

## 2021-04-06 LAB — MAGNESIUM: Magnesium: 2 mg/dL (ref 1.6–2.3)

## 2021-04-06 NOTE — Progress Notes (Signed)
Cardiology Office Note    Date:  04/09/2021   ID:  [name] [name], DOB 1946/03/26, MRN [id number]  PCP:  Unice Bailey, MD  Cardiologist:  [name], MD  Electrophysiologist:  None   Chief Complaint: Follow-up  History of Present Illness:   [name] [name] is a 75 y.o. male with history of recently diagnosed A. fib of uncertain chronicity status post DCCV on 02/19/2021, DM2, HTN, HLD, iron deficiency anemia requiring IV iron, osteoarthritis, diverticulosis, OSA on CPAP, BPH, and obesity who presents for follow-up of frequent PVCs.   He was seen as a new patient by Dr. Fletcher Anon on 01/02/2021 for preoperative cardiovascular risk stratification before total hip arthroplasty given preop EKG 12/28/2020 showed newly diagnosed A. fib.  At his visit with our office on 7/5, he was not aware of any prior history of arrhythmia.  He had no previously known cardiac history.  He denied any symptoms of angina or decompensation.  He remained in A. fib with ventricular rates in the 90s bpm.  Given his CHA2DS2-VASc of 4, initiation of Eliquis was recommended following hip surgery.  He was not requiring rate controlling medications.  Echo on 01/03/2021 demonstrated an EF of 55 to 60%, no regional wall motion abnormalities, indeterminate LV diastolic function parameters, normal RV systolic function and ventricular cavity size, mildly dilated left atrium, trivial mitral regurgitation, and an estimated right atrial pressure of 3 mmHg.  He was seen in follow-up on 02/08/2021 and was doing well from a cardiac perspective.  He remained in A. fib with controlled ventricular response.  He was started on carvedilol 3.125 mg twice daily.  He had been adherent to anticoagulation.  He subsequently underwent successful DCCV on 02/19/2021.  He was last seen in the office on 03/16/2021 and was doing reasonably well.  He did continue to note some fatigue, though his activities were slowly improving.  He continued to need to stop after walking  up an incline to rest.  He was maintaining sinus rhythm with frequent PVCs noted in a pattern of ventricular trigeminy.  Carvedilol was titrated to 6.25 mg twice daily.  Subsequent 3-day Zio patch showed a predominant rhythm of sinus with an average rate of 81 bpm (range 63 to 121 bpm), first-degree AV block, 1 run of SVT lasting 4 beats, rare PACs, and frequent PVCs with a burden of 9.6%.  Given underlying first-degree AV block, further titration of beta-blocker was deferred.  He comes in doing well from a cardiac perspective.  Over the past couple of days he has noted an improvement in his shortness of breath and exertional fatigue.  Over this past weekend he was able to plant a tree and able to play 18 holes of golf without significant limitation.  No angina.  Tolerating titrated dose of carvedilol without dizziness, presyncope, or syncope.  He remains on apixaban without issues.  He is working on improving his diet.     Labs independently reviewed: 03/2021 -magnesium 2.0 03/2021 -potassium 4.3, BUN 14, serum creatinine 0.81, Hgb 11.0, PLT 394 01/2021 - A1c 8.0, albumin 4.2, AST/ALT normal, TC 144, TG 78, HDL 54, LDL 74, TSH normal    Past Medical History:  Diagnosis Date   Anemia    iron deficiency and b12 deficiency.  followed by oncology   Anxiety    Arthritis    Atrial fibrillation ([place]) 12/28/2020   BPH (benign prostatic hyperplasia)    Diabetes mellitus    Diverticulosis    Elevated platelet count  Hemorrhoids    HOH (hard of hearing)    wears hearing aides   Hypertension    OSA on CPAP    Prostate disorder     Past Surgical History:  Procedure Laterality Date   CARDIOVERSION N/A 02/19/2021   Procedure: CARDIOVERSION;  Surgeon: Wellington Hampshire, MD;  Location: [facility];  Service: Cardiovascular;  Laterality: N/A;   CATARACT EXTRACTION W/PHACO Left 07/14/2018   Procedure: CATARACT EXTRACTION PHACO AND INTRAOCULAR LENS PLACEMENT ([place]) LEFT, DIABETIC;  Surgeon: Birder Robson, MD;  Location: [facility];  Service: Ophthalmology;  Laterality: Left;  [place] 00:50 CDE 8.93 Fluid pack lot # [id number]   CATARACT EXTRACTION W/PHACO Right 09/01/2018   Procedure: CATARACT EXTRACTION PHACO AND INTRAOCULAR LENS PLACEMENT ([facility])-RIGHT, DIABETIC;  Surgeon: Birder Robson, MD;  Location: [facility];  Service: Ophthalmology;  Laterality: Right;  [place] 00:50.9 CDE 6.80 Fluid Pack Lot # [id number]     COLONOSCOPY     CYSTOSCOPY WITH INSERTION OF UROLIFT     CYSTOSCOPY WITH INSERTION OF UROLIFT  08/2018   ESOPHAGOGASTRODUODENOSCOPY (EGD) WITH PROPOFOL N/A 03/17/2019   Procedure: ESOPHAGOGASTRODUODENOSCOPY (EGD) WITH PROPOFOL;  Surgeon: Toledo, Benay Pike, MD;  Location: [facility];  Service: Gastroenterology;  Laterality: N/A;   EYE SURGERY Bilateral    cataract surgery   JOINT REPLACEMENT Bilateral 2014   Total knee replacement.   REPLACEMENT TOTAL KNEE Bilateral    TOTAL HIP ARTHROPLASTY Left 02/23/2020   Procedure: TOTAL HIP ARTHROPLASTY;  Surgeon: Dereck Leep, MD;  Location: [facility];  Service: Orthopedics;  Laterality: Left;   TOTAL HIP ARTHROPLASTY Right 01/10/2021   Procedure: TOTAL HIP ARTHROPLASTY;  Surgeon: Dereck Leep, MD;  Location: [facility];  Service: Orthopedics;  Laterality: Right;    Current Medications: Current Meds  Medication Sig   acetaminophen (TYLENOL) 650 MG CR tablet Take 1,300 mg by mouth every 8 (eight) hours as needed for pain.   ALPRAZolam (XANAX) 0.5 MG tablet Take 1 mg by mouth at bedtime.   amLODipine (NORVASC) 10 MG tablet Take 1 tablet (10 mg total) by mouth daily.   amoxicillin (AMOXIL) 500 MG capsule Take 2,000 mg by mouth as needed (Prior to dental procedures).   apixaban (ELIQUIS) 5 MG TABS tablet Take 1 tablet (5 mg total) by mouth 2 (two) times daily.   benazepril-hydrochlorthiazide (LOTENSIN HCT) 20-12.5 MG tablet Take 1 tablet by mouth every evening.   glimepiride (AMARYL) 1 MG tablet Take 1 mg by mouth daily with breakfast.    Iron-Vitamins (GERITOL PO) Take by mouth as needed.   metFORMIN (GLUCOPHAGE-XR) 500 MG 24 hr tablet Take 1,000 mg by mouth daily with supper.    naphazoline-glycerin (CLEAR EYES REDNESS) 0.012-0.25 % SOLN Place 1-2 drops into both eyes 4 (four) times daily as needed for eye irritation.   OVER THE COUNTER MEDICATION Blood builder-1 capsule by mouth daily   pravastatin (PRAVACHOL) 10 MG tablet Take 10 mg by mouth daily with supper.    Semaglutide (RYBELSUS) 14 MG TABS Take 14 mg by mouth every morning.   sertraline (ZOLOFT) 50 MG tablet Take 50 mg by mouth every morning.   tamsulosin (FLOMAX) 0.4 MG CAPS capsule TAKE 2 CAPSULES BY MOUTH EVERY DAY AFTERBREAKFAST   vitamin B-12 (CYANOCOBALAMIN) 1000 MCG tablet Take 1,000 mcg by mouth daily.   zinc gluconate 50 MG tablet Take 50 mg by mouth daily.   [DISCONTINUED] carvedilol (COREG) 6.25 MG tablet Take 1 tablet (6.25 mg total) by mouth 2 (two) times daily. (Patient taking differently:  Take 12.5 mg by mouth 2 (two) times daily.)    Allergies:   Patient has no known allergies.   Social History   Socioeconomic History   Marital status: Married    Spouse name: Bonnita Nasuti   Number of children: 3   Years of education: Not on file   Highest education level: Not on file  Occupational History   Occupation: Higher education careers adviser, Clinical biochemist    Comment: retired  Tobacco Use   Smoking status: Never   Smokeless tobacco: Never  Vaping Use   Vaping Use: Never used  Substance and Sexual Activity   Alcohol use: Yes    Comment: wine-occasionally Previously drank a shot of vodka in the evenings   Drug use: Never   Sexual activity: Not on file  Other Topics Concern   Not on file  Social History Narrative   Patient lives with wife.  Feels safe   Social Determinants of Radio broadcast assistant Strain: Not on file  Food Insecurity: Not on file  Transportation Needs: Not on file  Physical Activity: Not on file  Stress: Not on file  Social Connections: Not on  file     Family History:  The patient's family history includes Alzheimer's disease in his mother; Colon cancer in his sister; Diabetes in his mother.  ROS:   Review of Systems  Constitutional:  Negative for chills, diaphoresis, fever, malaise/fatigue and weight loss.  HENT:  Negative for congestion.   Eyes:  Negative for discharge and redness.  Respiratory:  Negative for cough, sputum production, shortness of breath and wheezing.   Cardiovascular:  Negative for chest pain, palpitations, orthopnea, claudication, leg swelling and PND.  Gastrointestinal:  Negative for abdominal pain, blood in stool, heartburn, melena, nausea and vomiting.  Musculoskeletal:  Negative for falls and myalgias.  Skin:  Negative for rash.  Neurological:  Negative for dizziness, tingling, tremors, sensory change, speech change, focal weakness, loss of consciousness and weakness.  Endo/Heme/Allergies:  Does not bruise/bleed easily.  Psychiatric/Behavioral:  Negative for substance abuse. The patient is not nervous/anxious.   All other systems reviewed and are negative.   EKGs/Labs/Other Studies Reviewed:    Studies reviewed were summarized above. The additional studies were reviewed today:   Zio patch 03/2021: Patient had a min HR of 63 bpm, max HR of 121 bpm, and avg HR of 81 bpm.  Predominant underlying rhythm was Sinus Rhythm. First Degree AV Block was present. 1  run of Supraventricular Tachycardia occurred lasting 4 beats with a max rate of 96 bpm (avg 92 bpm). Rare PACs. Frequent PVCs with a burden of 9.6%. __________  2D echo 01/03/2021: 1. Left ventricular ejection fraction, by estimation, is 55 to 60%. The  left ventricle has normal function. The left ventricle has no regional  wall motion abnormalities. Left ventricular diastolic parameters are  indeterminate.   2. Right ventricular systolic function is normal. The right ventricular  size is normal.   3. Left atrial size was mildly dilated.    4. The mitral valve is normal in structure. Trivial mitral valve  regurgitation.   5. The aortic valve is tricuspid. Aortic valve regurgitation is not  visualized.   6. The inferior vena cava is normal in size with greater than 50%  respiratory variability, suggesting right atrial pressure of 3 mmHg.    EKG:  EKG is ordered today.  The EKG ordered today demonstrates NSR, 76 bpm, stable first-degree AV block, rare PVC with improved burden, no acute ST-T changes  Recent Labs: 12/28/2020: ALT 12 02/08/2021: TSH 1.000 03/16/2021: BUN 14; Creatinine, Ser 0.81; Hemoglobin 11.0; Platelets 394; Potassium 4.3; Sodium 131 04/05/2021: Magnesium 2.0  Recent Lipid Panel    Component Value Date/Time   CHOL 170 12/20/2014 1238   TRIG 75 12/20/2014 1238   HDL 65 12/20/2014 1238   CHOLHDL 2.6 12/20/2014 1238   LDLCALC 90 12/20/2014 1238    PHYSICAL EXAM:    VS:  BP 140/72 (BP Location: Left Arm, Patient Position: Sitting, Cuff Size: Normal)   Pulse 88   Ht 5\' 10"  (1.778 m)   Wt 287 lb (130.2 kg)   SpO2 97%   BMI 41.18 kg/m   BMI: Body mass index is 41.18 kg/m.  Physical Exam Vitals reviewed.  Constitutional:      Appearance: He is well-developed.  HENT:     Head: Normocephalic and atraumatic.  Eyes:     General:        Right eye: No discharge.        Left eye: No discharge.  Neck:     Vascular: No JVD.  Cardiovascular:     Rate and Rhythm: Normal rate and regular rhythm.     Pulses:          Posterior tibial pulses are 2+ on the right side and 2+ on the left side.     Heart sounds: Normal heart sounds, S1 normal and S2 normal. Heart sounds not distant. No midsystolic click and no opening snap. No murmur heard.   No friction rub.  Pulmonary:     Effort: Pulmonary effort is normal. No respiratory distress.     Breath sounds: Normal breath sounds. No decreased breath sounds, wheezing or rales.  Chest:     Chest wall: No tenderness.  Abdominal:     General: There is no distension.      Palpations: Abdomen is soft.     Tenderness: There is no abdominal tenderness.  Musculoskeletal:     Cervical back: Normal range of motion.     Right lower leg: No edema.     Left lower leg: No edema.  Skin:    General: Skin is warm and dry.     Nails: There is no clubbing.  Neurological:     Mental Status: He is alert and oriented to person, place, and time.  Psychiatric:        Speech: Speech normal.        Behavior: Behavior normal.        Thought Content: Thought content normal.        Judgment: Judgment normal.    Wt Readings from Last 3 Encounters:  04/09/21 287 lb (130.2 kg)  03/29/21 285 lb (129.3 kg)  03/16/21 285 lb (129.3 kg)     ASSESSMENT & PLAN:   Frequent PVCs: Burden of 9.6% on recent Zio patch.  On titrated dose of carvedilol, burden appears to be improved in the office today.  This coincides with his improvement in exertional shortness of breath and fatigue as well.  Given symptoms, and PVCs, we will schedule a Lexiscan MPI to evaluate for high risk ischemia.  With stable underlying first-degree AV block we we will maintain him on carvedilol 6.25 mg twice daily without titration.  Laboratory evaluation unrevealing as outlined above.  Persistent A. fib: Maintaining sinus rhythm with a stable first-degree AV block on carvedilol.  Given a CHA2DS2-VASc of at least 4 (HTN, age x2, DM), he remains on apixaban without symptoms concerning for bleeding  with stable labs as outlined above.  HTN: Blood pressure is mildly elevated in the office today, though has historically been controlled.  Continue current medication including carvedilol, Lotensin HCT, and amlodipine.  HLD: LDL 82.  He remains on pravastatin.  OSA: CPAP.  Disposition: F/u with Dr. Fletcher Anon or an APP in 2 months.   Medication Adjustments/Labs and Tests Ordered: Current medicines are reviewed at length with the patient today.  Concerns regarding medicines are outlined above. Medication changes, Labs  and Tests ordered today are summarized above and listed in the Patient Instructions accessible in Encounters.   Signed, Christell Faith, PA-C 04/09/2021 9:51 AM     [facility] [place] [address] [place] [address] [place], [place] [address] [phone]

## 2021-04-09 ENCOUNTER — Other Ambulatory Visit: Payer: Self-pay

## 2021-04-09 ENCOUNTER — Encounter: Payer: Self-pay | Admitting: Physician Assistant

## 2021-04-09 ENCOUNTER — Ambulatory Visit: Payer: Medicare Other | Admitting: Physician Assistant

## 2021-04-09 VITALS — BP 140/72 | HR 88 | Ht 70.0 in | Wt 287.0 lb

## 2021-04-09 DIAGNOSIS — I493 Ventricular premature depolarization: Secondary | ICD-10-CM

## 2021-04-09 DIAGNOSIS — R0602 Shortness of breath: Secondary | ICD-10-CM

## 2021-04-09 DIAGNOSIS — I4819 Other persistent atrial fibrillation: Secondary | ICD-10-CM | POA: Diagnosis not present

## 2021-04-09 DIAGNOSIS — G4733 Obstructive sleep apnea (adult) (pediatric): Secondary | ICD-10-CM

## 2021-04-09 DIAGNOSIS — Z9989 Dependence on other enabling machines and devices: Secondary | ICD-10-CM

## 2021-04-09 DIAGNOSIS — I10 Essential (primary) hypertension: Secondary | ICD-10-CM

## 2021-04-09 DIAGNOSIS — E782 Mixed hyperlipidemia: Secondary | ICD-10-CM

## 2021-04-09 MED ORDER — CARVEDILOL 6.25 MG PO TABS: 6.2500 mg | ORAL_TABLET | Freq: Two times a day (BID) | ORAL | 1 refills | Status: DC

## 2021-04-09 NOTE — Patient Instructions (Addendum)
Medication Instructions:  Your physician recommends that you continue on your current medications as directed. Please refer to the Current Medication list given to you today.  Carvedilol 6.25 mg twice a daily has bee refilled today.  *If you need a refill on your cardiac medications before your next appointment, please call your pharmacy*   Lab Work: None ordered If you have labs (blood work) drawn today and your tests are completely normal, you will receive your results only by: Between (if you have MyChart) OR A paper copy in the mail If you have any lab test that is abnormal or we need to change your treatment, we will call you to review the results.   Testing/Procedures: Your physician has requested that you have a lexiscan myoview. For further information please visit [email or web address]. Please follow instruction sheet, as given.    Follow-Up: At [facility], you and your health needs are our priority.  As part of our continuing mission to provide you with exceptional heart care, we have created designated Provider Care Teams.  These Care Teams include your primary Cardiologist (physician) and Advanced Practice Providers (APPs -  Physician Assistants and Nurse Practitioners) who all work together to provide you with the care you need, when you need it.  We recommend signing up for the patient portal called "MyChart".  Sign up information is provided on this After Visit Summary.  MyChart is used to connect with patients for Virtual Visits (Telemedicine).  Patients are able to view lab/test results, encounter notes, upcoming appointments, etc.  Non-urgent messages can be sent to your provider as well.   To learn more about what you can do with MyChart, go to [email or web address].    Your next appointment:   2 month(s)  The format for your next appointment:   In Person  Provider:   You may see [name], MD or one of the following Advanced Practice Providers on  your designated Care Team:   Murray Hodgkins, NP Christell Faith, PA-C Marrianne Mood, PA-C Cadence Kathlen Mody, [place]   Other Instructions [place]  Your caregiver has ordered a Stress Test with nuclear imaging. The purpose of this test is to evaluate the blood supply to your heart muscle. This procedure is referred to as a "Non-Invasive Stress Test." This is because other than having an IV started in your vein, nothing is inserted or "invades" your body. Cardiac stress tests are done to find areas of poor blood flow to the heart by determining the extent of coronary artery disease (CAD). Some patients exercise on a treadmill, which naturally increases the blood flow to your heart, while others who are  unable to walk on a treadmill due to physical limitations have a pharmacologic/chemical stress agent called Lexiscan . This medicine will mimic walking on a treadmill by temporarily increasing your coronary blood flow.   Please note: these test may take anywhere between 2-4 hours to complete  PLEASE REPORT TO [place] AT THE FIRST DESK WILL DIRECT YOU WHERE TO GO  Date of Procedure:_____________________________________  Arrival Time for Procedure:______________________________  Instructions regarding medication:   _X_ : Hold diabetes medication morning of procedure  _X_:  Hold betablocker(Carvedilol) night before procedure and morning of procedure  _X_:  Hold other medications as follows: Do not take Benazepril HCTZ the morning on the test. You can take it after.  PLEASE NOTIFY THE OFFICE AT LEAST 3 HOURS IN ADVANCE IF YOU ARE UNABLE TO KEEP YOUR APPOINTMENT.  [phone] AND  PLEASE NOTIFY NUCLEAR MEDICINE AT [facility] AT LEAST 24 HOURS IN ADVANCE IF YOU ARE UNABLE TO KEEP YOUR APPOINTMENT. [phone]  How to prepare for your Myoview test:  Do not eat or drink after midnight No caffeine for 24 hours prior to test No smoking 24 hours prior to  test. Your medication may be taken with water.  If your doctor stopped a medication because of this test, do not take that medication. Please wear a short sleeve shirt. No cologne or lotion. Wear comfortable walking shoes. No heels!

## 2021-04-13 ENCOUNTER — Ambulatory Visit
Admission: RE | Admit: 2021-04-13 | Discharge: 2021-04-13 | Disposition: A | Discharge status: Home / Self Care | Payer: Medicare Other | Source: Ambulatory Visit | Attending: Physician Assistant | Admitting: Physician Assistant

## 2021-04-13 DIAGNOSIS — R0602 Shortness of breath: Secondary | ICD-10-CM | POA: Insufficient documentation

## 2021-04-13 LAB — NM MYOCAR MULTI W/SPECT W/WALL MOTION / EF
LV dias vol: 119 mL (ref 62–150)
LV sys vol: 32 mL
MPHR: 145 {beats}/min
Nuc Stress EF: 67 %
Peak HR: 96 {beats}/min
Percent HR: 66 %
Rest HR: 77 {beats}/min
Rest Nuclear Isotope Dose: 10.5 mCi
ST Depression (mm): 0 mm
Stress Nuclear Isotope Dose: 30.9 mCi
TID: 1.09

## 2021-04-13 MED ORDER — TECHNETIUM TC 99M TETROFOSMIN IV KIT
30.0000 | PACK | Freq: Once | INTRAVENOUS | Status: AC | PRN
  Administered 2021-04-13: 30.9 via INTRAVENOUS

## 2021-04-13 MED ORDER — TECHNETIUM TC 99M TETROFOSMIN IV KIT
10.0000 | PACK | Freq: Once | INTRAVENOUS | Status: AC | PRN
  Administered 2021-04-13: 10.5 via INTRAVENOUS

## 2021-04-13 MED ORDER — REGADENOSON 0.4 MG/5ML IV SOLN
0.4000 mg | Freq: Once | INTRAVENOUS | Status: AC
  Administered 2021-04-13: 0.4 mg via INTRAVENOUS

## 2021-04-16 ENCOUNTER — Telehealth: Payer: Self-pay | Admitting: Physician Assistant

## 2021-04-16 MED ORDER — METOPROLOL TARTRATE 25 MG PO TABS: 25.0000 mg | ORAL_TABLET | Freq: Two times a day (BID) | ORAL | 1 refills | Status: DC

## 2021-04-16 MED ORDER — ATORVASTATIN CALCIUM 40 MG PO TABS: 40.0000 mg | ORAL_TABLET | Freq: Every day | ORAL | 1 refills | Status: DC

## 2021-04-16 NOTE — Telephone Encounter (Signed)
I spoke with the patient regarding his stress test results and Ryan's recommendations to: 1) Stop pravastatin 2) Start atorvastatin 40 mg once daily 3) Stop carvedilol 4) Start metoprolol tartrate 25 mg BID  The patient voices understanding of these recommendations and is agreeable.  He is advised to keep his upcoming appointment with Christell Faith, PA on 06/11/21-- he is aware he will most likely be scheduled for a repeat lipid panel at that time & follow up EKG with possible 3 day ZIO to re-evaluate his PVC burden. The patient again voiced understanding and is agreeable.

## 2021-04-16 NOTE — Telephone Encounter (Signed)
Rise Mu, PA-C  04/16/2021 10:12 AM EDT     Stress test showed no significant ischemia with a pump function of 47% (possibly falsely low due to GI uptake artifact). Recent echo in 12/2020 with normal pump function. Resting EKG and recovery EKG with frequent PVCs. CT images with mild coronary artery calcification and mild aortic atherosclerosis. Overall, this was a low risk study.    Recommendations: -With LDL of 82 and noted coronary artery calcifications/aortic atherosclerosis, stop pravastatin and start Lipitor 40 mg daily -With continued frequent PVCs noted on EKG during stress test, stop Coreg and start Lopressor 25 mg bid -Follow up as planned to reassess PVC burden on metoprolol

## 2021-05-02 ENCOUNTER — Ambulatory Visit: Payer: Medicare Other | Admitting: Physician Assistant

## 2021-06-10 NOTE — Progress Notes (Signed)
Cardiology Office Note    Date:  06/11/2021   ID:  [name] [name], DOB 1946/05/21, MRN [id number]  PCP:  Unice Bailey, MD  Cardiologist:  [name], MD  Electrophysiologist:  None   Chief Complaint: Follow-up  History of Present Illness:   [name] [name] is a 75 y.o. male with history of mild coronary artery calcification and aortic atherosclerosis, persistent A. fib of uncertain chronicity status post DCCV on 02/19/2021, DM2, HTN, HLD, iron deficiency anemia requiring IV iron, osteoarthritis, diverticulosis, OSA on CPAP, BPH, and obesity who presents for follow-up of frequent PVCs.   He was seen as a new patient by Dr. Fletcher Anon on 01/02/2021 for preoperative cardiovascular risk stratification before total hip arthroplasty given preop EKG 12/28/2020 showed newly diagnosed A. fib.  At his visit with our office on 7/5, he was not aware of any prior history of arrhythmia.  He had no previously known cardiac history.  He denied any symptoms of angina or decompensation.  He remained in A. fib with ventricular rates in the 90s bpm.  Given his CHA2DS2-VASc of 4, initiation of Eliquis was recommended following hip surgery.  He was not requiring rate controlling medications.  Echo on 01/03/2021 demonstrated an EF of 55 to 60%, no regional wall motion abnormalities, indeterminate LV diastolic function parameters, normal RV systolic function and ventricular cavity size, mildly dilated left atrium, trivial mitral regurgitation, and an estimated right atrial pressure of 3 mmHg.  He was seen in follow-up on 02/08/2021 and was doing well from a cardiac perspective.  He remained in A. fib with controlled ventricular response.  He was started on carvedilol 3.125 mg twice daily.  He had been adherent to anticoagulation.  He subsequently underwent successful DCCV on 02/19/2021.  He was seen in the office on 03/16/2021 and was doing reasonably well.  He did continue to note some fatigue, though his activities were slowly  improving.  He continued to need to stop after walking up an incline to rest.  He was maintaining sinus rhythm with frequent PVCs noted in a pattern of ventricular trigeminy.  Carvedilol was titrated to 6.25 mg twice daily.  Subsequent 3-day Zio patch showed a predominant rhythm of sinus with an average rate of 81 bpm (range 63 to 121 bpm), first-degree AV block, 1 run of SVT lasting 4 beats, rare PACs, and frequent PVCs with a burden of 9.6%.  Given underlying first-degree AV block, further titration of beta-blocker was deferred.  He was last seen in the office on 04/09/2021 and continued to do well from a cardiac perspective.  He did note an improvement in his shortness of breath and exertional fatigue a couple of days prior to that visit.  He was maintained on carvedilol 6.25 mg twice daily with a noted stable first-degree AV block.  PVC burden was improved on twelve-lead EKG in the office.  Given exertional fatigue and shortness of breath symptoms, and in the context of frequent PVCs, he underwent Lexiscan MPI on 04/13/2021 which showed no significant ischemia with normal wall motion, EF estimated at 47% with GI uptake artifact noted (echo in 12/2020 demonstrated preserved LVSF), resting in recovery EKG with frequent PVCs, and the study was overall low risk.  CT attenuated corrected images did show a mild coronary artery calcification and mild aortic atherosclerosis.  Given ectopy noted on EKG tracings during stress test, carvedilol was discontinued and he was started on Lopressor 25 mg twice daily.  He comes in today and is doing well from  a cardiac perspective.  He has tolerated the transition from carvedilol to metoprolol tartrate without issue.  He feels like his energy is overall improved when compared to his last visit.  He did recently go on a several day golfing trip and was able to play 3 rounds over 4 days.  With this, he was quite tired after his first round.  No anginal symptoms.  He did consume a  little more alcohol than he usually does on this trip.  His weight is up 9 pounds today when compared to his last clinic visit and he attributes this to caloric intake.  He remains on apixaban without falls, hematochezia, or melena.  No dizziness, presyncope, or syncope.   Labs independently reviewed: 03/2021 - magnesium 2.0 03/2021 - potassium 4.3, BUN 14, serum creatinine 0.81, Hgb 11.0, PLT 394 01/2021 - A1c 8.0, albumin 4.2, AST/ALT normal, TC 144, TG 78, HDL 54, LDL 74, TSH normal  Past Medical History:  Diagnosis Date   Anemia    iron deficiency and b12 deficiency.  followed by oncology   Anxiety    Arthritis    Atrial fibrillation ([place]) 12/28/2020   BPH (benign prostatic hyperplasia)    Diabetes mellitus    Diverticulosis    Elevated platelet count    Hemorrhoids    HOH (hard of hearing)    wears hearing aides   Hypertension    OSA on CPAP    Prostate disorder     Past Surgical History:  Procedure Laterality Date   CARDIOVERSION N/A 02/19/2021   Procedure: CARDIOVERSION;  Surgeon: Wellington Hampshire, MD;  Location: [facility] OR[facility];  Service: Cardiovascular;  Laterality: N/A;   CATARACT EXTRACTION W/PHACO Left 07/14/2018   Procedure: CATARACT EXTRACTION PHACO AND INTRAOCULAR LENS PLACEMENT (Divide) LEFT, DIABETIC;  Surgeon: Birder Robson, MD;  Location: [facility];  Service: Ophthalmology;  Laterality: Left;  [place] 00:50 CDE 8.93 Fluid pack lot # [id number]   CATARACT EXTRACTION W/PHACO Right 09/01/2018   Procedure: CATARACT EXTRACTION PHACO AND INTRAOCULAR LENS PLACEMENT (IOC)-RIGHT, DIABETIC;  Surgeon: Birder Robson, MD;  Location: [facility];  Service: Ophthalmology;  Laterality: Right;  [place] 00:50.9 CDE 6.80 Fluid Pack Lot # [id number]     COLONOSCOPY     CYSTOSCOPY WITH INSERTION OF UROLIFT     CYSTOSCOPY WITH INSERTION OF UROLIFT  08/2018   ESOPHAGOGASTRODUODENOSCOPY (EGD) WITH PROPOFOL N/A 03/17/2019   Procedure: ESOPHAGOGASTRODUODENOSCOPY (EGD) WITH PROPOFOL;  Surgeon: [place],  Benay Pike, MD;  Location: [facility];  Service: Gastroenterology;  Laterality: N/A;   EYE SURGERY Bilateral    cataract surgery   JOINT REPLACEMENT Bilateral 2014   Total knee replacement.   REPLACEMENT TOTAL KNEE Bilateral    TOTAL HIP ARTHROPLASTY Left 02/23/2020   Procedure: TOTAL HIP ARTHROPLASTY;  Surgeon: Dereck Leep, MD;  Location: [facility];  Service: Orthopedics;  Laterality: Left;   TOTAL HIP ARTHROPLASTY Right 01/10/2021   Procedure: TOTAL HIP ARTHROPLASTY;  Surgeon: Dereck Leep, MD;  Location: [facility];  Service: Orthopedics;  Laterality: Right;    Current Medications: Current Meds  Medication Sig   acetaminophen (TYLENOL) 650 MG CR tablet Take 1,300 mg by mouth every 8 (eight) hours as needed for pain.   ALPRAZolam (XANAX) 0.5 MG tablet Take 1 mg by mouth at bedtime.   amLODipine (NORVASC) 10 MG tablet Take 1 tablet (10 mg total) by mouth daily.   amoxicillin (AMOXIL) 500 MG capsule Take 2,000 mg by mouth as needed (Prior to dental procedures).   apixaban (ELIQUIS)  5 MG TABS tablet Take 1 tablet (5 mg total) by mouth 2 (two) times daily.   atorvastatin (LIPITOR) 40 MG tablet Take 1 tablet (40 mg total) by mouth daily.   benazepril-hydrochlorthiazide (LOTENSIN HCT) 20-12.5 MG tablet Take 1 tablet by mouth every evening.   flecainide (TAMBOCOR) 50 MG tablet Take 1 tablet (50 mg total) by mouth 2 (two) times daily.   glimepiride (AMARYL) 1 MG tablet Take 1 mg by mouth daily with breakfast.   Iron-Vitamins (GERITOL PO) Take by mouth as needed.   metFORMIN (GLUCOPHAGE-XR) 500 MG 24 hr tablet Take 1,500 mg by mouth daily.   mirabegron ER (MYRBETRIQ) 50 MG TB24 tablet Take 1 tablet (50 mg total) by mouth daily.   naphazoline-glycerin (CLEAR EYES REDNESS) 0.012-0.25 % SOLN Place 1-2 drops into both eyes 4 (four) times daily as needed for eye irritation.   OVER THE COUNTER MEDICATION Blood builder-1 capsule by mouth daily   Semaglutide (RYBELSUS) 14 MG TABS Take 14 mg by  mouth every morning.   sertraline (ZOLOFT) 50 MG tablet Take 50 mg by mouth every morning.   tamsulosin (FLOMAX) 0.4 MG CAPS capsule TAKE 2 CAPSULES BY MOUTH EVERY DAY AFTERBREAKFAST   vitamin B-12 (CYANOCOBALAMIN) 1000 MCG tablet Take 1,000 mcg by mouth daily.   zinc gluconate 50 MG tablet Take 50 mg by mouth daily.   [DISCONTINUED] metoprolol tartrate (LOPRESSOR) 25 MG tablet Take 1 tablet (25 mg total) by mouth 2 (two) times daily.    Allergies:   Patient has no known allergies.   Social History   Socioeconomic History   Marital status: Married    Spouse name: Bonnita Nasuti   Number of children: 3   Years of education: Not on file   Highest education level: Not on file  Occupational History   Occupation: Higher education careers adviser, Clinical biochemist    Comment: retired  Tobacco Use   Smoking status: Never   Smokeless tobacco: Never  Vaping Use   Vaping Use: Never used  Substance and Sexual Activity   Alcohol use: Yes    Comment: wine-occasionally Previously drank a shot of vodka in the evenings   Drug use: Never   Sexual activity: Not on file  Other Topics Concern   Not on file  Social History Narrative   Patient lives with wife.  Feels safe   Social Determinants of Radio broadcast assistant Strain: Not on file  Food Insecurity: Not on file  Transportation Needs: Not on file  Physical Activity: Not on file  Stress: Not on file  Social Connections: Not on file     Family History:  The patient's family history includes Alzheimer's disease in his mother; Colon cancer in his sister; Diabetes in his mother.  ROS:   Review of Systems  Constitutional:  Positive for malaise/fatigue. Negative for chills, diaphoresis, fever and weight loss.  HENT:  Negative for congestion.   Eyes:  Negative for discharge and redness.  Respiratory:  Negative for cough, sputum production, shortness of breath and wheezing.   Cardiovascular:  Negative for chest pain, palpitations, orthopnea, claudication, leg swelling  and PND.  Gastrointestinal:  Negative for abdominal pain, blood in stool, heartburn, melena, nausea and vomiting.  Musculoskeletal:  Negative for falls and myalgias.  Skin:  Negative for rash.  Neurological:  Negative for dizziness, tingling, tremors, sensory change, speech change, focal weakness, loss of consciousness and weakness.  Endo/Heme/Allergies:  Does not bruise/bleed easily.  Psychiatric/Behavioral:  Negative for substance abuse. The patient is not nervous/anxious.  EKGs/Labs/Other Studies Reviewed:    Studies reviewed were summarized above. The additional studies were reviewed today:  Lexiscan MPI 03/2021:   No ST deviation was noted.   Pharmacological myocardial perfusion imaging study with no significant  ischemia Normal wall motion, EF estimated at 47%, GI uptake artifact noted No EKG changes concerning for ischemia at peak stress or in recovery. Resting and recovery EKG with frequent PVCs CT attenuation correction images with mild coronary calcification, mild aortic atherosclerosis Low risk scan  Zio patch 03/2021: Patient had a min HR of 63 bpm, max HR of 121 bpm, and avg HR of 81 bpm.  Predominant underlying rhythm was Sinus Rhythm. First Degree AV Block was present. 1  run of Supraventricular Tachycardia occurred lasting 4 beats with a max rate of 96 bpm (avg 92 bpm). Rare PACs. Frequent PVCs with a burden of 9.6%. __________   2D echo 01/03/2021: 1. Left ventricular ejection fraction, by estimation, is 55 to 60%. The  left ventricle has normal function. The left ventricle has no regional  wall motion abnormalities. Left ventricular diastolic parameters are  indeterminate.   2. Right ventricular systolic function is normal. The right ventricular  size is normal.   3. Left atrial size was mildly dilated.   4. The mitral valve is normal in structure. Trivial mitral valve  regurgitation.   5. The aortic valve is tricuspid. Aortic valve regurgitation is not   visualized.   6. The inferior vena cava is normal in size with greater than 50%  respiratory variability, suggesting right atrial pressure of 3 mmHg.    EKG:  EKG is ordered today.  The EKG ordered today demonstrates NSR, 79 bpm, first-degree AV block, frequent PVCs in a pattern of ventricular bigeminy and trigeminy, no acute ST-T changes  Recent Labs: 12/28/2020: ALT 12 02/08/2021: TSH 1.000 03/16/2021: BUN 14; Creatinine, Ser 0.81; Hemoglobin 11.0; Platelets 394; Potassium 4.3; Sodium 131 04/05/2021: Magnesium 2.0  Recent Lipid Panel    Component Value Date/Time   CHOL 170 12/20/2014 1238   TRIG 75 12/20/2014 1238   HDL 65 12/20/2014 1238   CHOLHDL 2.6 12/20/2014 1238   LDLCALC 90 12/20/2014 1238    PHYSICAL EXAM:    VS:  BP 128/70 (BP Location: Left Arm, Patient Position: Sitting, Cuff Size: Large)   Pulse 79   Ht 5\' 10"  (1.778 m)   Wt 296 lb (134.3 kg)   SpO2 96%   BMI 42.47 kg/m   BMI: Body mass index is 42.47 kg/m.  Physical Exam Vitals reviewed.  Constitutional:      Appearance: He is well-developed.  HENT:     Head: Normocephalic and atraumatic.  Eyes:     General:        Right eye: No discharge.        Left eye: No discharge.  Neck:     Vascular: No JVD.  Cardiovascular:     Rate and Rhythm: Normal rate and regular rhythm. FrequentExtrasystoles are present.    Pulses:          Posterior tibial pulses are 2+ on the right side and 2+ on the left side.     Heart sounds: Normal heart sounds, S1 normal and S2 normal. Heart sounds not distant. No midsystolic click and no opening snap. No murmur heard.   No friction rub.  Pulmonary:     Effort: Pulmonary effort is normal. No respiratory distress.     Breath sounds: Normal breath sounds. No decreased breath sounds, wheezing  or rales.  Chest:     Chest wall: No tenderness.  Abdominal:     General: There is no distension.     Palpations: Abdomen is soft.     Tenderness: There is no abdominal tenderness.   Musculoskeletal:     Cervical back: Normal range of motion.     Right lower leg: No edema.     Left lower leg: No edema.  Skin:    General: Skin is warm and dry.     Nails: There is no clubbing.  Neurological:     Mental Status: He is alert and oriented to person, place, and time.  Psychiatric:        Speech: Speech normal.        Behavior: Behavior normal.        Thought Content: Thought content normal.        Judgment: Judgment normal.    Wt Readings from Last 3 Encounters:  06/11/21 296 lb (134.3 kg)  04/09/21 287 lb (130.2 kg)  03/29/21 285 lb (129.3 kg)     ASSESSMENT & PLAN:   Frequent PVCs: Outpatient cardiac monitoring demonstrated a burden of 9.6%.  Echo has demonstrated preserved LV systolic function and Lexiscan MPI has shown no evidence of high risk ischemia.  Laboratory evaluation has been unrevealing.  Despite transition from carvedilol to metoprolol he continues to have frequent PVCs with an overall stable first-degree AV block.  Case was discussed with DOD with recommendation to initiate flecainide 50 mg twice daily.  We will decrease Lopressor to 12.5 mg twice daily given first-degree AV block.  Following initiation of flecainide we will perform an ETT in the office.  Persistent A. fib: Maintaining sinus rhythm with frequent PVCs.  Decrease metoprolol due to first-degree AV block and initiate flecainide as outlined above.  Given a CHA2DS2-VASc of at least 5 (HTN, age x2, DM, vascular disease), he remains on indefinite [place] with apixaban without symptoms concerning for bleeding.  HTN: Blood pressure is well controlled in the office today.  He remains on Lopressor, amlodipine, and Lotensin HCT.  Coronary artery calcification/aortic atherosclerosis/HLD: LDL 74.  He was transitioned from pravastatin to atorvastatin following his [place] MPI in 03/2021.  Continue current dose of atorvastatin with recommendation to obtain a fasting lipid panel and LFT at his next  visit.  Obesity with OSA: His weight is up 9 pounds today when compared to his last clinic visit.  Weight loss is encouraged through healthy diet and regular exercise.  CPAP.    Disposition: F/u with Dr. Fletcher Anon or an APP in 8 weeks.   Medication Adjustments/Labs and Tests Ordered: Current medicines are reviewed at length with the patient today.  Concerns regarding medicines are outlined above. Medication changes, Labs and Tests ordered today are summarized above and listed in the Patient Instructions accessible in Encounters.   Signed, Christell Faith, PA-C 06/11/2021 3:57 PM     [address] [place], [place] [address] [phone]

## 2021-06-11 ENCOUNTER — Encounter: Payer: Self-pay | Admitting: Physician Assistant

## 2021-06-11 ENCOUNTER — Ambulatory Visit: Payer: Medicare Other | Admitting: Physician Assistant

## 2021-06-11 ENCOUNTER — Other Ambulatory Visit: Payer: Self-pay

## 2021-06-11 VITALS — BP 128/70 | HR 79 | Ht 70.0 in | Wt 296.0 lb

## 2021-06-11 DIAGNOSIS — I10 Essential (primary) hypertension: Secondary | ICD-10-CM

## 2021-06-11 DIAGNOSIS — E785 Hyperlipidemia, unspecified: Secondary | ICD-10-CM

## 2021-06-11 DIAGNOSIS — Z6841 Body Mass Index (BMI) 40.0 and over, adult: Secondary | ICD-10-CM

## 2021-06-11 DIAGNOSIS — I700 Atherosclerosis of aorta: Secondary | ICD-10-CM

## 2021-06-11 DIAGNOSIS — I493 Ventricular premature depolarization: Secondary | ICD-10-CM | POA: Diagnosis not present

## 2021-06-11 DIAGNOSIS — Z79899 Other long term (current) drug therapy: Secondary | ICD-10-CM | POA: Diagnosis not present

## 2021-06-11 DIAGNOSIS — I2584 Coronary atherosclerosis due to calcified coronary lesion: Secondary | ICD-10-CM

## 2021-06-11 DIAGNOSIS — I4819 Other persistent atrial fibrillation: Secondary | ICD-10-CM | POA: Diagnosis not present

## 2021-06-11 DIAGNOSIS — I2510 Atherosclerotic heart disease of native coronary artery without angina pectoris: Secondary | ICD-10-CM

## 2021-06-11 MED ORDER — FLECAINIDE ACETATE 50 MG PO TABS: 50.0000 mg | ORAL_TABLET | Freq: Two times a day (BID) | ORAL | 3 refills | Status: DC

## 2021-06-11 MED ORDER — METOPROLOL TARTRATE 25 MG PO TABS: 12.5000 mg | ORAL_TABLET | Freq: Two times a day (BID) | ORAL | 3 refills | Status: DC

## 2021-06-11 NOTE — Patient Instructions (Signed)
Medication Instructions:  Your physician has recommended you make the following change in your medication:   DECREASE Metoprolol tartrate 25 mg and take one half tablet (12.5 mg) twice a day START Flecainide 50 mg twice a day  *If you need a refill on your cardiac medications before your next appointment, please call your pharmacy*   Lab Work: None  If you have labs (blood work) drawn today and your tests are completely normal, you will receive your results only by: [place] (if you have MyChart) OR A paper copy in the mail If you have any lab test that is abnormal or we need to change your treatment, we will call you to review the results.   Testing/Procedures: Exercise Stress test for Flecainide therapy in 4 weeks. (GXT)  - you may eat a light breakfast/ lunch prior to your procedure - no caffeine for 24 hours prior to your test (coffee, tea, soft drinks, or chocolate)  - no smoking/ vaping for 4 hours prior to your test - you may take your regular medications the day of your test. - bring any inhalers with you to your test - wear comfortable clothing & tennis/ non-skid shoes to walk on the treadmill   Follow-Up: At [facility], you and your health needs are our priority.  As part of our continuing mission to provide you with exceptional heart care, we have created designated Provider Care Teams.  These Care Teams include your primary Cardiologist (physician) and Advanced Practice Providers (APPs -  Physician Assistants and Nurse Practitioners) who all work together to provide you with the care you need, when you need it.   Your next appointment:   8 week(s)  The format for your next appointment:   In Person  Provider:   [name], MD or Christell Faith, PA-C

## 2021-07-03 ENCOUNTER — Telehealth: Payer: Self-pay | Admitting: Physician Assistant

## 2021-07-03 DIAGNOSIS — I4819 Other persistent atrial fibrillation: Secondary | ICD-10-CM

## 2021-07-03 NOTE — Telephone Encounter (Addendum)
Called patient to review pre- test instructions. Lmtcb.  Gxt appt 07/04/21 @ 10am.  No cardiac att signed. Will pend order and fwd to Christell Faith, PA to sign.    - you may eat a light breakfast/ lunch prior to your procedure - no caffeine for 24 hours prior to your test (coffee, tea, soft drinks, or chocolate)  - no smoking/ vaping for 4 hours prior to your test - you may take your regular medications the day of your test except for: DO NOT take Metoprolol for 24 hours prior - bring any inhalers with you to your test - wear comfortable clothing & tennis/ non-skid shoes to walk on the treadmill

## 2021-07-04 ENCOUNTER — Other Ambulatory Visit: Payer: Self-pay

## 2021-07-04 ENCOUNTER — Ambulatory Visit: Payer: Medicare Other

## 2021-07-04 DIAGNOSIS — Z79899 Other long term (current) drug therapy: Secondary | ICD-10-CM

## 2021-07-04 DIAGNOSIS — I493 Ventricular premature depolarization: Secondary | ICD-10-CM

## 2021-07-04 DIAGNOSIS — I4819 Other persistent atrial fibrillation: Secondary | ICD-10-CM

## 2021-07-04 NOTE — Telephone Encounter (Addendum)
Late entry 07/03/21 patient made aware of gxt instructions with verbalized understanding. Pt confirmed gxt scheduled date, time and location.  Confirmed cardiac testing att has been signed by the provider.

## 2021-07-12 ENCOUNTER — Ambulatory Visit: Payer: Self-pay | Admitting: Urology

## 2021-07-12 LAB — EXERCISE TOLERANCE TEST
Angina Index: 0
Base ST Depression (mm): 0 mm
Duke Treadmill Score: 5
Estimated workload: 7
Exercise duration (min): 5 min
Exercise duration (sec): 0 s
MPHR: 145 {beats}/min
Peak HR: 160 {beats}/min
Percent HR: 110 %
Rest HR: 116 {beats}/min
ST Depression (mm): 0 mm

## 2021-07-13 ENCOUNTER — Other Ambulatory Visit: Payer: Self-pay | Admitting: Physician Assistant

## 2021-08-06 ENCOUNTER — Encounter: Payer: Self-pay | Admitting: Physician Assistant

## 2021-08-06 ENCOUNTER — Other Ambulatory Visit: Payer: Self-pay

## 2021-08-06 ENCOUNTER — Ambulatory Visit: Payer: Medicare Other | Admitting: Physician Assistant

## 2021-08-06 VITALS — BP 158/80 | HR 71 | Ht 70.0 in | Wt 290.2 lb

## 2021-08-06 DIAGNOSIS — E785 Hyperlipidemia, unspecified: Secondary | ICD-10-CM

## 2021-08-06 DIAGNOSIS — G4733 Obstructive sleep apnea (adult) (pediatric): Secondary | ICD-10-CM

## 2021-08-06 DIAGNOSIS — Z6841 Body Mass Index (BMI) 40.0 and over, adult: Secondary | ICD-10-CM

## 2021-08-06 DIAGNOSIS — I10 Essential (primary) hypertension: Secondary | ICD-10-CM | POA: Diagnosis not present

## 2021-08-06 DIAGNOSIS — Z9989 Dependence on other enabling machines and devices: Secondary | ICD-10-CM

## 2021-08-06 DIAGNOSIS — I4819 Other persistent atrial fibrillation: Secondary | ICD-10-CM | POA: Diagnosis not present

## 2021-08-06 DIAGNOSIS — I493 Ventricular premature depolarization: Secondary | ICD-10-CM | POA: Diagnosis not present

## 2021-08-06 DIAGNOSIS — I2584 Coronary atherosclerosis due to calcified coronary lesion: Secondary | ICD-10-CM

## 2021-08-06 DIAGNOSIS — I700 Atherosclerosis of aorta: Secondary | ICD-10-CM

## 2021-08-06 DIAGNOSIS — I2510 Atherosclerotic heart disease of native coronary artery without angina pectoris: Secondary | ICD-10-CM

## 2021-08-06 MED ORDER — METOPROLOL TARTRATE 25 MG PO TABS: 12.5000 mg | ORAL_TABLET | Freq: Two times a day (BID) | ORAL | 3 refills | Status: DC

## 2021-08-06 NOTE — Progress Notes (Signed)
Cardiology Office Note    Date:  08/06/2021   ID:  [name] [name], DOB 1946/05/09, MRN [id number]  PCP:  [name] Bailey, MD  Cardiologist:  [name] Sacramento, MD  Electrophysiologist:  None   Chief Complaint: Follow up  History of Present Illness:   [name] [name] is a 76 y.o. male with history of mild coronary artery calcification and aortic atherosclerosis, persistent A. Fib status post DCCV on 02/19/2021, DM2, HTN, HLD, iron deficiency anemia requiring IV iron, osteoarthritis, diverticulosis, OSA on CPAP, BPH, and obesity who presents for follow-up of flecainide ETT.   He was seen as a new patient by [name] [name] on 01/02/2021 for preoperative cardiovascular risk stratification before total hip arthroplasty given preop EKG 12/28/2020 showed newly diagnosed A. fib.  At his visit with our office in 12/2020, he was not aware of any prior history of arrhythmia.  He had no previously known cardiac history.  He denied any symptoms of angina or decompensation.  He remained in A. fib with ventricular rates in the 90s bpm.  Given his CHA2DS2-VASc of 4, initiation of Eliquis was recommended following hip surgery.  He was not requiring rate controlling medications.  Echo on 01/03/2021 demonstrated an EF of 55 to 60%, no regional wall motion abnormalities, indeterminate LV diastolic function parameters, normal RV systolic function and ventricular cavity size, mildly dilated left atrium, trivial mitral regurgitation, and an estimated right atrial pressure of 3 mmHg.  He was seen in follow-up on 02/08/2021 and was doing well from a cardiac perspective.  He remained in A. fib with controlled ventricular response.  He was started on carvedilol 3.125 mg twice daily.  He had been adherent to anticoagulation.  He subsequently underwent successful DCCV on 02/19/2021.  He was seen in the office on 03/16/2021 and was doing reasonably well.  He did continue to note some fatigue, though his activities were slowly improving.  He was  maintaining sinus rhythm with frequent PVCs noted in a pattern of ventricular trigeminy.  Carvedilol was titrated to 6.25 mg twice daily.  Subsequent 3-day Zio patch showed a predominant rhythm of sinus with an average rate of 81 bpm (range 63 to 121 bpm), first-degree AV block, 1 run of SVT lasting 4 beats, rare PACs, and frequent PVCs with a burden of 9.6%.  Given underlying first-degree AV block, further titration of beta-blocker was deferred.  He was seen in the office on 04/09/2021 and continued to do well from a cardiac perspective.  He did note an improvement in his shortness of breath and exertional fatigue a couple of days prior to that visit.  He was maintained on carvedilol 6.25 mg twice daily with a noted stable first-degree AV block.  PVC burden was improved on twelve-lead EKG in the office.  Given exertional fatigue and shortness of breath symptoms, and in the context of frequent PVCs, he underwent Lexiscan MPI on 04/13/2021 which showed no significant ischemia with normal wall motion, EF estimated at 47% with GI uptake artifact noted (echo in 12/2020 demonstrated preserved LVSF), resting and recovery EKGs with frequent PVCs.  The study was overall low risk.  CT attenuated corrected images did show a mild coronary artery calcification and mild aortic atherosclerosis.  Given ectopy noted on EKG tracings during stress test, carvedilol was discontinued and he was started on Lopressor 25 mg twice daily.  He was last seen in the office on 06/11/2021 and was doing well from a cardiac perspective.  He had tolerated transition from carvedilol to metoprolol  without issue.  He felt like his energy was improving.  He was maintaining sinus rhythm with a first-degree AV block with frequent PVCs.  Metoprolol was decreased and he was initiated on flecainide.  Subsequent ETT on 07/04/2021 was normal and without evidence of arrhythmia as outlined below.  He comes in doing well from a cardiac perspective.  He is  without symptoms of angina or decompensation.  No palpitations.  He does feel like his fatigue is improving.  He attributes fatigue to underlying back pain with physical deconditioning.  His fatigue is less noticeable when he is ambulating with a walking stick.  He has had one fall since he was last seen, falling onto his buttock.  He did not hit his head or suffer LOC.  No hematochezia or melena.  He is tolerating all medications without issues.  His weight is down 6 pounds today when compared to his prior clinic visit.  He notes frequent fast food/takeout.  He plans on working on his diet.  He has follow-up with his PCP for a routine checkup and blood work next week.   Labs independently reviewed: 03/2021 - magnesium 2.0 03/2021 - potassium 4.3, BUN 14, serum creatinine 0.81, Hgb 11.0, PLT 394 01/2021 - A1c 8.0, albumin 4.2, AST/ALT normal, TC 144, TG 78, HDL 54, LDL 74, TSH normal  Past Medical History:  Diagnosis Date   Anemia    iron deficiency and b12 deficiency.  followed by oncology   Anxiety    Arthritis    Atrial fibrillation ([place]) 12/28/2020   BPH (benign prostatic hyperplasia)    Diabetes mellitus    Diverticulosis    Elevated platelet count    Hemorrhoids    HOH (hard of hearing)    wears hearing aides   Hypertension    OSA on CPAP    Prostate disorder     Past Surgical History:  Procedure Laterality Date   CARDIOVERSION N/A 02/19/2021   Procedure: CARDIOVERSION;  Surgeon: [name] Hampshire, MD;  Location: [facility] OR[facility];  Service: Cardiovascular;  Laterality: N/A;   CATARACT EXTRACTION W/PHACO Left 07/14/2018   Procedure: CATARACT EXTRACTION PHACO AND INTRAOCULAR LENS PLACEMENT ([place]) LEFT, DIABETIC;  Surgeon: Birder Robson, MD;  Location: [facility];  Service: Ophthalmology;  Laterality: Left;  [place] 00:50 CDE 8.93 Fluid pack lot # [id number]   CATARACT EXTRACTION W/PHACO Right 09/01/2018   Procedure: CATARACT EXTRACTION PHACO AND INTRAOCULAR LENS PLACEMENT ([facility])-RIGHT, DIABETIC;   Surgeon: Birder Robson, MD;  Location: [facility];  Service: Ophthalmology;  Laterality: Right;  [place] 00:50.9 CDE 6.80 Fluid Pack Lot # [id number]     COLONOSCOPY     CYSTOSCOPY WITH INSERTION OF UROLIFT     CYSTOSCOPY WITH INSERTION OF UROLIFT  08/2018   ESOPHAGOGASTRODUODENOSCOPY (EGD) WITH PROPOFOL N/A 03/17/2019   Procedure: ESOPHAGOGASTRODUODENOSCOPY (EGD) WITH PROPOFOL;  Surgeon: [place], Benay Pike, MD;  Location: [facility];  Service: Gastroenterology;  Laterality: N/A;   EYE SURGERY Bilateral    cataract surgery   JOINT REPLACEMENT Bilateral 2014   Total knee replacement.   REPLACEMENT TOTAL KNEE Bilateral    TOTAL HIP ARTHROPLASTY Left 02/23/2020   Procedure: TOTAL HIP ARTHROPLASTY;  Surgeon: Dereck Leep, MD;  Location: [facility];  Service: Orthopedics;  Laterality: Left;   TOTAL HIP ARTHROPLASTY Right 01/10/2021   Procedure: TOTAL HIP ARTHROPLASTY;  Surgeon: Dereck Leep, MD;  Location: [facility];  Service: Orthopedics;  Laterality: Right;    Current Medications: Current Meds  Medication Sig   acetaminophen (TYLENOL) 650  MG CR tablet Take 1,300 mg by mouth every 8 (eight) hours as needed for pain.   ALPRAZolam (XANAX) 0.5 MG tablet Take 1 mg by mouth at bedtime.   amLODipine (NORVASC) 10 MG tablet Take 1 tablet (10 mg total) by mouth daily.   amoxicillin (AMOXIL) 500 MG capsule Take 2,000 mg by mouth as needed (Prior to dental procedures).   apixaban (ELIQUIS) 5 MG TABS tablet Take 1 tablet (5 mg total) by mouth 2 (two) times daily.   atorvastatin (LIPITOR) 40 MG tablet Take 1 tablet (40 mg total) by mouth daily.   benazepril-hydrochlorthiazide (LOTENSIN HCT) 20-12.5 MG tablet Take 1 tablet by mouth every evening.   flecainide (TAMBOCOR) 50 MG tablet Take 1 tablet (50 mg total) by mouth 2 (two) times daily.   glimepiride (AMARYL) 1 MG tablet Take 1 mg by mouth daily with breakfast.   Iron-Vitamins (GERITOL PO) Take by mouth as needed.   metFORMIN (GLUCOPHAGE-XR) 500 MG  24 hr tablet Take 1,500 mg by mouth daily.   mirabegron ER (MYRBETRIQ) 50 MG TB24 tablet Take 1 tablet (50 mg total) by mouth daily.   naphazoline-glycerin (CLEAR EYES REDNESS) 0.012-0.25 % SOLN Place 1-2 drops into both eyes 4 (four) times daily as needed for eye irritation.   OVER THE COUNTER MEDICATION Blood builder-1 capsule by mouth daily   Semaglutide (RYBELSUS) 14 MG TABS Take 14 mg by mouth every morning.   sertraline (ZOLOFT) 50 MG tablet Take 50 mg by mouth every morning.   tamsulosin (FLOMAX) 0.4 MG CAPS capsule TAKE 2 CAPSULES BY MOUTH EVERY DAY AFTERBREAKFAST   vitamin B-12 (CYANOCOBALAMIN) 1000 MCG tablet Take 1,000 mcg by mouth daily.   zinc gluconate 50 MG tablet Take 50 mg by mouth daily.   [DISCONTINUED] metoprolol tartrate (LOPRESSOR) 25 MG tablet Take 0.5 tablets (12.5 mg total) by mouth 2 (two) times daily.    Allergies:   Patient has no known allergies.   Social History   Socioeconomic History   Marital status: Married    Spouse name: Bonnita Nasuti   Number of children: 3   Years of education: Not on file   Highest education level: Not on file  Occupational History   Occupation: Higher education careers adviser, Clinical biochemist    Comment: retired  Tobacco Use   Smoking status: Never   Smokeless tobacco: Never  Vaping Use   Vaping Use: Never used  Substance and Sexual Activity   Alcohol use: Yes    Comment: wine-occasionally Previously drank a shot of vodka in the evenings   Drug use: Never   Sexual activity: Not on file  Other Topics Concern   Not on file  Social History Narrative   Patient lives with wife.  Feels safe   Social Determinants of Radio broadcast assistant Strain: Not on file  Food Insecurity: Not on file  Transportation Needs: Not on file  Physical Activity: Not on file  Stress: Not on file  Social Connections: Not on file     Family History:  The patient's family history includes Alzheimer's disease in his mother; Colon cancer in his sister; Diabetes in his  mother.  ROS:   Review of Systems  Constitutional:  Positive for malaise/fatigue. Negative for chills, diaphoresis, fever and weight loss.  HENT:  Negative for congestion.   Eyes:  Negative for discharge and redness.  Respiratory:  Negative for cough, sputum production, shortness of breath and wheezing.   Cardiovascular:  Negative for chest pain, palpitations, orthopnea, claudication, leg swelling and PND.  Gastrointestinal:  Negative for blood in stool and melena.  Musculoskeletal:  Positive for back pain and falls. Negative for myalgias.  Skin:  Negative for rash.  Neurological:  Negative for dizziness, tingling, tremors, sensory change, speech change, focal weakness, loss of consciousness and weakness.  Endo/Heme/Allergies:  Does not bruise/bleed easily.  Psychiatric/Behavioral:  Negative for substance abuse. The patient is not nervous/anxious.   All other systems reviewed and are negative.   EKGs/Labs/Other Studies Reviewed:    Studies reviewed were summarized above. The additional studies were reviewed today:  ETT 07/04/2021:   Exercise capacity was normal. Patient exercised for 5 min and 0 sec. Maximum HR of 160 bpm. MPHR 110.0 %. Peak METS 7.0 .   No ST deviation was noted. There were no arrhythmias during stress. There were no arrhythmias during recovery. ECG was interpretable and without significant changes. The ECG was negative for ischemia.   Normal treadmill stress test with no evidence of arrhythmia. __________  Carlton Adam MPI 03/2021:   No ST deviation was noted.   Pharmacological myocardial perfusion imaging study with no significant  ischemia Normal wall motion, EF estimated at 47%, GI uptake artifact noted No EKG changes concerning for ischemia at peak stress or in recovery. Resting and recovery EKG with frequent PVCs CT attenuation correction images with mild coronary calcification, mild aortic atherosclerosis Low risk scan   Zio patch 03/2021: Patient had a min  HR of 63 bpm, max HR of 121 bpm, and avg HR of 81 bpm.  Predominant underlying rhythm was Sinus Rhythm. First Degree AV Block was present. 1  run of Supraventricular Tachycardia occurred lasting 4 beats with a max rate of 96 bpm (avg 92 bpm). Rare PACs. Frequent PVCs with a burden of 9.6%. __________   2D echo 01/03/2021: 1. Left ventricular ejection fraction, by estimation, is 55 to 60%. The  left ventricle has normal function. The left ventricle has no regional  wall motion abnormalities. Left ventricular diastolic parameters are  indeterminate.   2. Right ventricular systolic function is normal. The right ventricular  size is normal.   3. Left atrial size was mildly dilated.   4. The mitral valve is normal in structure. Trivial mitral valve  regurgitation.   5. The aortic valve is tricuspid. Aortic valve regurgitation is not  visualized.   6. The inferior vena cava is normal in size with greater than 50%  respiratory variability, suggesting right atrial pressure of 3 mmHg.   EKG:  EKG is ordered today.  The EKG ordered today demonstrates NSR 72, bpm, 1st degree AV block, poor R wave progression, no acute st/t changes  Recent Labs: 12/28/2020: ALT 12 02/08/2021: TSH 1.000 03/16/2021: BUN 14; Creatinine, Ser 0.81; Hemoglobin 11.0; Platelets 394; Potassium 4.3; Sodium 131 04/05/2021: Magnesium 2.0  Recent Lipid Panel    Component Value Date/Time   CHOL 170 12/20/2014 1238   TRIG 75 12/20/2014 1238   HDL 65 12/20/2014 1238   CHOLHDL 2.6 12/20/2014 1238   LDLCALC 90 12/20/2014 1238    PHYSICAL EXAM:    VS:  BP (!) 158/80 (BP Location: Left Arm, Patient Position: Sitting, Cuff Size: Normal)    Pulse 71    Ht 5\' 10"  (1.778 m)    Wt 290 lb 4 oz (131.7 kg)    SpO2 96%    BMI 41.65 kg/m   BMI: Body mass index is 41.65 kg/m.  Physical Exam Vitals reviewed.  Constitutional:      Appearance: He is well-developed.  HENT:  Head: Normocephalic and atraumatic.  Eyes:     General:         Right eye: No discharge.        Left eye: No discharge.  Neck:     Vascular: No JVD.  Cardiovascular:     Rate and Rhythm: Normal rate and regular rhythm.     Pulses:          Posterior tibial pulses are 2+ on the right side and 2+ on the left side.     Heart sounds: Normal heart sounds, S1 normal and S2 normal. Heart sounds not distant. No midsystolic click and no opening snap. No murmur heard.   No friction rub.  Pulmonary:     Effort: Pulmonary effort is normal. No respiratory distress.     Breath sounds: Normal breath sounds. No decreased breath sounds, wheezing or rales.  Chest:     Chest wall: No tenderness.  Abdominal:     General: There is no distension.     Palpations: Abdomen is soft.     Tenderness: There is no abdominal tenderness.  Musculoskeletal:     Cervical back: Normal range of motion.     Right lower leg: No edema.     Left lower leg: No edema.  Skin:    General: Skin is warm and dry.     Nails: There is no clubbing.  Neurological:     Mental Status: He is alert and oriented to person, place, and time.  Psychiatric:        Speech: Speech normal.        Behavior: Behavior normal.        Thought Content: Thought content normal.        Judgment: Judgment normal.    Wt Readings from Last 3 Encounters:  08/06/21 290 lb 4 oz (131.7 kg)  06/11/21 296 lb (134.3 kg)  04/09/21 287 lb (130.2 kg)     ASSESSMENT & PLAN:   Frequent PVCs: Quiescent.  He remains on Lopressor with stable first-degree AV block.  Persistent Afib: Maintaining sinus rhythm with Lopressor and flecainide.  Given a CHADS2VASc of 5 (HTN, age x 2, DM, vascular disease), he remains on indefinite Eliquis 5 mg bid (he does not meet reduced dosing criteria) and is without symptoms concerning for bleeding.  Blood work will be obtained through his PCPs office next week.  HTN: Blood pressure is mildly elevated at triage, typically this is well controlled.  No changes were made at this time.   He remains on amlodipine, Lotensin HCT, and Lopressor.  Low-sodium diet encouraged.  Coronary artery calcification/aortic atherosclerosis/HLD: LDL 74.  He was transitioned from pravastatin to atorvastatin 40 mg following his Lexiscan MPI in 03/2021.  He has blood work scheduled to be drawn through his PCPs office next week.  Obesity with OSA: His weight is down 6 pounds today when compared to his last clinic visit.  Continued weight loss is encouraged to heart of the diet and regular exercise.  CPAP.   Disposition: F/u with [name] [name] or an APP in 6 months.   Medication Adjustments/Labs and Tests Ordered: Current medicines are reviewed at length with the patient today.  Concerns regarding medicines are outlined above. Medication changes, Labs and Tests ordered today are summarized above and listed in the Patient Instructions accessible in Encounters.   Signed, Christell Faith, PA-C 08/06/2021 12:48 PM     [address] [place], [place] [address] [phone]

## 2021-08-06 NOTE — Patient Instructions (Signed)
Medication Instructions:  No changes at this time.   *If you need a refill on your cardiac medications before your next appointment, please call your pharmacy*   Lab Work: None  If you have labs (blood work) drawn today and your tests are completely normal, you will receive your results only by: [place] (if you have MyChart) OR A paper copy in the mail If you have any lab test that is abnormal or we need to change your treatment, we will call you to review the results.   Testing/Procedures: None   Follow-Up: At [facility], you and your health needs are our priority.  As part of our continuing mission to provide you with exceptional heart care, we have created designated Provider Care Teams.  These Care Teams include your primary Cardiologist (physician) and Advanced Practice Providers (APPs -  Physician Assistants and Nurse Practitioners) who all work together to provide you with the care you need, when you need it.   Your next appointment:   6 month(s)  The format for your next appointment:   In Person  Provider:   [name], MD or Christell Faith, PA-C

## 2021-08-10 ENCOUNTER — Other Ambulatory Visit: Payer: Self-pay | Admitting: Cardiovascular Disease

## 2021-08-10 NOTE — Telephone Encounter (Signed)
Refill request

## 2021-08-10 NOTE — Telephone Encounter (Signed)
Scheduled

## 2021-08-10 NOTE — Telephone Encounter (Signed)
Eliquis 5 mg refill request received. Patient is 76 years old, weight- 131.7 kg, Crea- 0.65 on 03/16/21, Diagnosis- PAF, and last seen by Christell Faith, PA on 06/11/21. Dose is appropriate based on dosing criteria. Will send in refill to requested pharmacy.

## 2021-08-10 NOTE — Telephone Encounter (Signed)
Attempted to contact.

## 2021-09-27 ENCOUNTER — Ambulatory Visit: Payer: Medicare Other | Admitting: Urology

## 2021-09-27 ENCOUNTER — Encounter: Payer: Self-pay | Admitting: Urology

## 2021-09-27 VITALS — BP 141/65 | HR 84 | Ht 70.0 in | Wt 290.0 lb

## 2021-09-27 DIAGNOSIS — R351 Nocturia: Secondary | ICD-10-CM | POA: Diagnosis not present

## 2021-09-27 DIAGNOSIS — N433 Hydrocele, unspecified: Secondary | ICD-10-CM

## 2021-09-27 DIAGNOSIS — N401 Enlarged prostate with lower urinary tract symptoms: Secondary | ICD-10-CM

## 2021-09-27 NOTE — Progress Notes (Signed)
? ?09/27/2021 ?2:40 PM  ? ?[name] [name] ?[birth year] ?[id number] ? ?Referring provider: Unice Bailey, MD ?[address], [address] ?[place],  [place] [address] ? ?Chief Complaint  ?Patient presents with  ? Follow-up  ? ? ?HPI: ?76 y.o. male presents for follow-up visit. ? ?Refer to my previous note 07/12/2020 for a clinical summary ?Underwent hydrocele aspiration 03/29/2021 ?States no significant hydrocele reaccumulation and he is asymptomatic ?He has nocturia x1-2 and feels his voiding symptoms have improved since discontinuing Diet Coke and drinking more water ? ? ?PMH: ?Past Medical History:  ?Diagnosis Date  ? Anemia   ? iron deficiency and b12 deficiency.  followed by oncology  ? Anxiety   ? Arthritis   ? Atrial fibrillation ([place]) 12/28/2020  ? BPH (benign prostatic hyperplasia)   ? Diabetes mellitus   ? Diverticulosis   ? Elevated platelet count   ? Hemorrhoids   ? HOH (hard of hearing)   ? wears hearing aides  ? Hypertension   ? OSA on CPAP   ? Prostate disorder   ? ? ?Surgical History: ?Past Surgical History:  ?Procedure Laterality Date  ? CARDIOVERSION N/A 02/19/2021  ? Procedure: CARDIOVERSION;  Surgeon: Wellington Hampshire, MD;  Location: [facility];  Service: Cardiovascular;  Laterality: N/A;  ? CATARACT EXTRACTION W/PHACO Left 07/14/2018  ? Procedure: CATARACT EXTRACTION PHACO AND INTRAOCULAR LENS PLACEMENT (IOC) LEFT, DIABETIC;  Surgeon: Birder Robson, MD;  Location: [facility];  Service: Ophthalmology;  Laterality: Left;  [place] 00:50 ?CDE 8.93 ?Fluid pack lot # [id number]  ? CATARACT EXTRACTION W/PHACO Right 09/01/2018  ? Procedure: CATARACT EXTRACTION PHACO AND INTRAOCULAR LENS PLACEMENT (IOC)-RIGHT, DIABETIC;  Surgeon: Birder Robson, MD;  Location: [facility];  Service: Ophthalmology;  Laterality: Right;  [place] 00:50.9 ?CDE 6.80 ?Fluid Pack Lot # [id number] ? ?  ? COLONOSCOPY    ? CYSTOSCOPY WITH INSERTION OF UROLIFT    ? CYSTOSCOPY WITH INSERTION OF UROLIFT  08/2018  ? ESOPHAGOGASTRODUODENOSCOPY (EGD) WITH  PROPOFOL N/A 03/17/2019  ? Procedure: ESOPHAGOGASTRODUODENOSCOPY (EGD) WITH PROPOFOL;  Surgeon: Toledo, Benay Pike, MD;  Location: [facility];  Service: Gastroenterology;  Laterality: N/A;  ? EYE SURGERY Bilateral   ? cataract surgery  ? JOINT REPLACEMENT Bilateral 2014  ? Total knee replacement.  ? REPLACEMENT TOTAL KNEE Bilateral   ? TOTAL HIP ARTHROPLASTY Left 02/23/2020  ? Procedure: TOTAL HIP ARTHROPLASTY;  Surgeon: Dereck Leep, MD;  Location: [facility];  Service: Orthopedics;  Laterality: Left;  ? TOTAL HIP ARTHROPLASTY Right 01/10/2021  ? Procedure: TOTAL HIP ARTHROPLASTY;  Surgeon: Dereck Leep, MD;  Location: [facility];  Service: Orthopedics;  Laterality: Right;  ? ? ?Home Medications:  ?Allergies as of 09/27/2021   ?No Known Allergies ?  ? ?  ?Medication List  ?  ? ?  ? Accurate as of September 27, 2021  2:40 PM. If you have any questions, ask your nurse or doctor.  ?  ?  ? ?  ? ?acetaminophen 650 MG CR tablet ?Commonly known as: TYLENOL ?Take 1,300 mg by mouth every 8 (eight) hours as needed for pain. ?  ?ALPRAZolam 0.5 MG tablet ?Commonly known as: Duanne Moron ?Take 1 mg by mouth at bedtime. ?  ?amLODipine 10 MG tablet ?Commonly known as: NORVASC ?Take 1 tablet (10 mg total) by mouth daily. ?  ?amoxicillin 500 MG capsule ?Commonly known as: AMOXIL ?Take 2,000 mg by mouth as needed (Prior to dental procedures). ?  ?atorvastatin 40 MG tablet ?Commonly known as: LIPITOR ?Take 1 tablet (40 mg  total) by mouth daily. ?  ?benazepril-hydrochlorthiazide 20-12.5 MG tablet ?Commonly known as: LOTENSIN HCT ?Take 1 tablet by mouth every evening. ?  ?Eliquis 5 MG Tabs tablet ?Generic drug: apixaban ?TAKE 1 TABLET BY MOUTH 2 TIMES DAILY. ?  ?flecainide 50 MG tablet ?Commonly known as: TAMBOCOR ?Take 1 tablet (50 mg total) by mouth 2 (two) times daily. ?  ?GERITOL PO ?Take by mouth as needed. ?  ?glimepiride 1 MG tablet ?Commonly known as: AMARYL ?Take 1 mg by mouth daily with breakfast. ?  ?metFORMIN 500 MG 24 hr  tablet ?Commonly known as: GLUCOPHAGE-XR ?Take 1,500 mg by mouth daily. ?  ?metoprolol tartrate 25 MG tablet ?Commonly known as: LOPRESSOR ?Take 0.5 tablets (12.5 mg total) by mouth 2 (two) times daily. ?  ?mirabegron ER 50 MG Tb24 tablet ?Commonly known as: MYRBETRIQ ?Take 1 tablet (50 mg total) by mouth daily. ?  ?naphazoline-glycerin 0.012-0.25 % Soln ?Commonly known as: CLEAR EYES REDNESS ?Place 1-2 drops into both eyes 4 (four) times daily as needed for eye irritation. ?  ?OVER THE COUNTER MEDICATION ?Blood builder-1 capsule by mouth daily ?  ?Rybelsus 14 MG Tabs ?Generic drug: Semaglutide ?Take 14 mg by mouth every morning. ?  ?sertraline 50 MG tablet ?Commonly known as: ZOLOFT ?Take 50 mg by mouth every morning. ?  ?tamsulosin 0.4 MG Caps capsule ?Commonly known as: FLOMAX ?TAKE 2 CAPSULES BY MOUTH EVERY DAY AFTERBREAKFAST ?  ?vitamin B-12 1000 MCG tablet ?Commonly known as: CYANOCOBALAMIN ?Take 1,000 mcg by mouth daily. ?  ?zinc gluconate 50 MG tablet ?Take 50 mg by mouth daily. ?  ? ?  ? ? ?Allergies: No Known Allergies ? ?Family History: ?Family History  ?Problem Relation Age of Onset  ? Diabetes Mother   ? Alzheimer's disease Mother   ? Colon cancer Sister   ? ? ?Social History:  reports that he has never smoked. He has never used smokeless tobacco. He reports current alcohol use. He reports that he does not use drugs. ? ? ?Physical Exam: ?BP (!) 141/65   Pulse 84   Ht '5\' 10"'$  (1.778 m)   Wt 290 lb (131.5 kg)   BMI 41.61 kg/m?   ?Constitutional:  Alert and oriented, No acute distress. ?HEENT: [place] AT, moist mucus membranes.  Trachea midline, no masses. ?Respiratory: Normal respiratory effort, no increased work of breathing. ?GU: Small left hydrocele ?Psychiatric: Normal mood and affect. ? ? ?Assessment & Plan:   ? ?1.  Left hydrocele ?Small left hydrocele, asymptomatic ? ?2. Nocturia ?Stable ? ?3.  BPH with LUTS ?Stable ? ? ?Return in about 1 year (around 09/28/2022). ? ?Abbie Sons, MD ? ?[place] ?[address] ?[place], [place] [address] ?[phone] ? ?

## 2021-10-10 ENCOUNTER — Other Ambulatory Visit: Payer: Self-pay | Admitting: Physician Assistant

## 2021-12-27 ENCOUNTER — Other Ambulatory Visit: Payer: Self-pay | Admitting: Physician Assistant

## 2022-01-07 ENCOUNTER — Other Ambulatory Visit: Payer: Self-pay | Admitting: Physician Assistant

## 2022-02-06 ENCOUNTER — Other Ambulatory Visit: Payer: Self-pay | Admitting: Urology

## 2022-02-08 ENCOUNTER — Ambulatory Visit: Payer: Medicare Other | Admitting: Cardiovascular Disease

## 2022-02-08 ENCOUNTER — Encounter: Payer: Self-pay | Admitting: Cardiovascular Disease

## 2022-02-08 VITALS — BP 110/60 | HR 73 | Ht 70.0 in | Wt 271.0 lb

## 2022-02-08 DIAGNOSIS — I493 Ventricular premature depolarization: Secondary | ICD-10-CM

## 2022-02-08 DIAGNOSIS — I4891 Unspecified atrial fibrillation: Secondary | ICD-10-CM | POA: Diagnosis not present

## 2022-02-08 DIAGNOSIS — E785 Hyperlipidemia, unspecified: Secondary | ICD-10-CM | POA: Diagnosis not present

## 2022-02-08 DIAGNOSIS — I10 Essential (primary) hypertension: Secondary | ICD-10-CM | POA: Diagnosis not present

## 2022-02-08 NOTE — Patient Instructions (Signed)
Medication Instructions:  Your physician recommends that you continue on your current medications as directed. Please refer to the Current Medication list given to you today.  *If you need a refill on your cardiac medications before your next appointment, please call your pharmacy*   Lab Work: None ordered If you have labs (blood work) drawn today and your tests are completely normal, you will receive your results only by: [place] (if you have MyChart) OR A paper copy in the mail If you have any lab test that is abnormal or we need to change your treatment, we will call you to review the results.   Testing/Procedures: None ordered   Follow-Up: At [facility], you and your health needs are our priority.  As part of our continuing mission to provide you with exceptional heart care, we have created designated Provider Care Teams.  These Care Teams include your primary Cardiologist (physician) and Advanced Practice Providers (APPs -  Physician Assistants and Nurse Practitioners) who all work together to provide you with the care you need, when you need it.  We recommend signing up for the patient portal called "MyChart".  Sign up information is provided on this After Visit Summary.  MyChart is used to connect with patients for Virtual Visits (Telemedicine).  Patients are able to view lab/test results, encounter notes, upcoming appointments, etc.  Non-urgent messages can be sent to your provider as well.   To learn more about what you can do with MyChart, go to [email or web address].    Your next appointment:   6 month(s)  The format for your next appointment:   In Person  Provider:   Christell Faith, PA-C{   Other Instructions N/A  Important Information About Sugar

## 2022-02-08 NOTE — Progress Notes (Signed)
Cardiology Office Note   Date:  02/08/2022   ID:  [name] [name], DOB 03-13-1946, MRN [id number]  PCP:  [name] Bailey, MD  Cardiologist:   [name] Sacramento, MD   Chief Complaint  Patient presents with   follow up    6 Month Follow up, no new cardiac concerns, working on A1C      History of Present Illness: [name] [name] is a 76 y.o. male who Is here today for follow-up visit regarding persistent atrial fibrillation. He has known history of coronary artery calcifications, essential hypertension, type 2 diabetes, hyperlipidemia, iron deficiency anemia, obstructive sleep apnea on CPAP and BPH.  He was diagnosed with atrial fibrillation in July 2022 before preoperative cardiovascular evaluation for hip replacement.  Echocardiogram showed normal LV systolic function with no significant valvular abnormalities.  He underwent successful cardioversion in August 2022.  He underwent a Lexiscan Myoview in October 2022 which showed no significant ischemia.  He was noted to have mild coronary and aortic calcifications.  He was noted to have frequent PVCs and was ultimately switched from carvedilol to small dose metoprolol and was started on flecainide.  Subsequent treadmill stress test showed no evidence of ischemia.  He has been doing well with no chest pain, shortness of breath or palpitations.  He continues to play golf on a regular basis.  No side effects with anticoagulation.   Past Medical History:  Diagnosis Date   Anemia    iron deficiency and b12 deficiency.  followed by oncology   Anxiety    Arthritis    Atrial fibrillation ([place]) 12/28/2020   BPH (benign prostatic hyperplasia)    Diabetes mellitus    Diverticulosis    Elevated platelet count    Hemorrhoids    HOH (hard of hearing)    wears hearing aides   Hypertension    OSA on CPAP    Prostate disorder     Past Surgical History:  Procedure Laterality Date   CARDIOVERSION N/A 02/19/2021   Procedure: CARDIOVERSION;  Surgeon:  Wellington Hampshire, MD;  Location: [facility];  Service: Cardiovascular;  Laterality: N/A;   CATARACT EXTRACTION W/PHACO Left 07/14/2018   Procedure: CATARACT EXTRACTION PHACO AND INTRAOCULAR LENS PLACEMENT ([place]) LEFT, DIABETIC;  Surgeon: Birder Robson, MD;  Location: [facility];  Service: Ophthalmology;  Laterality: Left;  [place] 00:50 CDE 8.93 Fluid pack lot # [id number]   CATARACT EXTRACTION W/PHACO Right 09/01/2018   Procedure: CATARACT EXTRACTION PHACO AND INTRAOCULAR LENS PLACEMENT (IOC)-RIGHT, DIABETIC;  Surgeon: Birder Robson, MD;  Location: [facility];  Service: Ophthalmology;  Laterality: Right;  [place] 00:50.9 CDE 6.80 Fluid Pack Lot # [id number]     COLONOSCOPY     CYSTOSCOPY WITH INSERTION OF UROLIFT     CYSTOSCOPY WITH INSERTION OF UROLIFT  08/2018   ESOPHAGOGASTRODUODENOSCOPY (EGD) WITH PROPOFOL N/A 03/17/2019   Procedure: ESOPHAGOGASTRODUODENOSCOPY (EGD) WITH PROPOFOL;  Surgeon: Toledo, Benay Pike, MD;  Location: [facility];  Service: Gastroenterology;  Laterality: N/A;   EYE SURGERY Bilateral    cataract surgery   JOINT REPLACEMENT Bilateral 2014   Total knee replacement.   REPLACEMENT TOTAL KNEE Bilateral    TOTAL HIP ARTHROPLASTY Left 02/23/2020   Procedure: TOTAL HIP ARTHROPLASTY;  Surgeon: Dereck Leep, MD;  Location: [facility];  Service: Orthopedics;  Laterality: Left;   TOTAL HIP ARTHROPLASTY Right 01/10/2021   Procedure: TOTAL HIP ARTHROPLASTY;  Surgeon: Dereck Leep, MD;  Location: [facility];  Service: Orthopedics;  Laterality: Right;     Current Outpatient Medications  Medication Sig Dispense Refill   acetaminophen (TYLENOL) 650 MG CR tablet Take 1,300 mg by mouth every 8 (eight) hours as needed for pain.     ALPRAZolam (XANAX) 0.5 MG tablet Take 1 mg by mouth at bedtime.     amLODipine (NORVASC) 10 MG tablet Take 1 tablet (10 mg total) by mouth daily. 90 tablet 0   atorvastatin (LIPITOR) 40 MG tablet TAKE 1 TABLET BY MOUTH DAILY 90 tablet 0    benazepril-hydrochlorthiazide (LOTENSIN HCT) 20-12.5 MG tablet Take 1 tablet by mouth every evening.     ELIQUIS 5 MG TABS tablet TAKE 1 TABLET BY MOUTH 2 TIMES DAILY. 60 tablet 5   flecainide (TAMBOCOR) 50 MG tablet Take 1 tablet (50 mg total) by mouth 2 (two) times daily. 180 tablet 3   Iron-Vitamins (GERITOL PO) Take by mouth as needed.     JARDIANCE 10 MG TABS tablet Take 10 mg by mouth daily.     metFORMIN (GLUCOPHAGE-XR) 500 MG 24 hr tablet Take 1,500 mg by mouth daily.     metoprolol tartrate (LOPRESSOR) 25 MG tablet Take 0.5 tablets (12.5 mg total) by mouth 2 (two) times daily. 90 tablet 3   mirabegron ER (MYRBETRIQ) 50 MG TB24 tablet Take 1 tablet (50 mg total) by mouth daily. 28 tablet 0   OZEMPIC, 0.25 OR 0.5 MG/DOSE, 2 MG/3ML SOPN Inject 0.5 mg into the skin once a week.     sertraline (ZOLOFT) 50 MG tablet Take 50 mg by mouth every morning.     tamsulosin (FLOMAX) 0.4 MG CAPS capsule TAKE 2 CAPSULES BY MOUTH DAILY AFTER BREAKFAST 180 capsule 3   vitamin B-12 (CYANOCOBALAMIN) 1000 MCG tablet Take 1,000 mcg by mouth daily.     zinc gluconate 50 MG tablet Take 50 mg by mouth daily.     amoxicillin (AMOXIL) 500 MG capsule Take 2,000 mg by mouth as needed (Prior to dental procedures). (Patient not taking: Reported on 02/08/2022)     glimepiride (AMARYL) 1 MG tablet Take 1 mg by mouth daily with breakfast. (Patient not taking: Reported on 02/08/2022)     naphazoline-glycerin (CLEAR EYES REDNESS) 0.012-0.25 % SOLN Place 1-2 drops into both eyes 4 (four) times daily as needed for eye irritation. (Patient not taking: Reported on 02/08/2022)     OVER THE COUNTER MEDICATION Blood builder-1 capsule by mouth daily (Patient not taking: Reported on 02/08/2022)     Semaglutide (RYBELSUS) 14 MG TABS Take 14 mg by mouth every morning. (Patient not taking: Reported on 02/08/2022)     No current facility-administered medications for this visit.    Allergies:   Patient has no known allergies.    Social  History:  The patient  reports that he has never smoked. He has never used smokeless tobacco. He reports current alcohol use. He reports that he does not use drugs.   Family History:  The patient's family history includes Alzheimer's disease in his mother; Colon cancer in his sister; Diabetes in his mother.    ROS:  Please see the history of present illness.   Otherwise, review of systems are positive for none.   All other systems are reviewed and negative.    PHYSICAL EXAM: VS:  BP 110/60 (BP Location: Left Arm, Patient Position: Sitting, Cuff Size: Large)   Pulse 73   Ht '5\' 10"'$  (1.778 m)   Wt 271 lb (122.9 kg)   SpO2 96%   BMI 38.88 kg/m  , BMI Body mass index is 38.88 kg/m. GEN:  Well nourished, well developed, in no acute distress  HEENT: normal  Neck: no JVD, carotid bruits, or masses Cardiac: Regular rate and rhythm; no murmurs, rubs, or gallops,no edema  Respiratory:  clear to auscultation bilaterally, normal work of breathing GI: soft, nontender, nondistended, + BS MS: no deformity or atrophy  Skin: warm and dry, no rash Neuro:  Strength and sensation are intact Psych: euthymic mood, full affect   EKG:  EKG is ordered today. The ekg ordered today demonstrates sinus rhythm with first-degree AV block and low voltage.   Recent Labs: 02/08/2021: TSH 1.000 03/16/2021: BUN 14; Creatinine, Ser 0.81; Hemoglobin 11.0; Platelets 394; Potassium 4.3; Sodium 131 04/05/2021: Magnesium 2.0    Lipid Panel    Component Value Date/Time   CHOL 170 12/20/2014 1238   TRIG 75 12/20/2014 1238   HDL 65 12/20/2014 1238   CHOLHDL 2.6 12/20/2014 1238   LDLCALC 90 12/20/2014 1238      Wt Readings from Last 3 Encounters:  02/08/22 271 lb (122.9 kg)  09/27/21 290 lb (131.5 kg)  08/06/21 290 lb 4 oz (131.7 kg)          01/02/2021    3:41 PM  PAD Screen  Previous PAD dx? No  Previous surgical procedure? No  Pain with walking? No  Feet/toe relief with dangling? No  Painful,  non-healing ulcers? No  Extremities discolored? No      ASSESSMENT AND PLAN:  1.  Frequent PVCs: No evidence of PVC today by exam or EKG.  These seem to be well-controlled with small dose metoprolol and flecainide.  Continue same medications.  2.  Persistent atrial fibrillation: Status post successful cardioversion and currently maintaining sinus rhythm with flecainide and metoprolol.  CHA2DS2-VASc score is 5.  Continue anticoagulation with Eliquis.  I reviewed recent labs which were stable.  3.  Essential hypertension: Blood pressure is well-controlled on current medications.  4.  Hyperlipidemia: He was switched from pravastatin to atorvastatin given coronary artery calcifications.  He is tolerating the medication.  I reviewed his recent lipid profile which showed an LDL of 57.    Disposition:   FU in 6 months.  Signed,  [name] Sacramento, MD  02/08/2022 8:55 AM    [place]

## 2022-02-14 ENCOUNTER — Other Ambulatory Visit: Payer: Self-pay | Admitting: Cardiovascular Disease

## 2022-02-14 NOTE — Telephone Encounter (Signed)
Prescription refill request for Eliquis received. Indication:Afib Last office visit:8/23 Scr:0.7 Age: 76 Weight:122.9 kg  Prescription refilled

## 2022-02-14 NOTE — Telephone Encounter (Signed)
Refill Request.  

## 2022-03-03 IMAGING — DX DG HIP (WITH OR WITHOUT PELVIS) 1V PORT*R*
2 series · 2 of 2 positions shown · non-contrast
Comparison: Radiographs 02/23/2020

CLINICAL DATA: Right total hip arthroplasty.

EXAM:
DG HIP (WITH OR WITHOUT PELVIS) 1V PORT RIGHT

[pelvis ap]
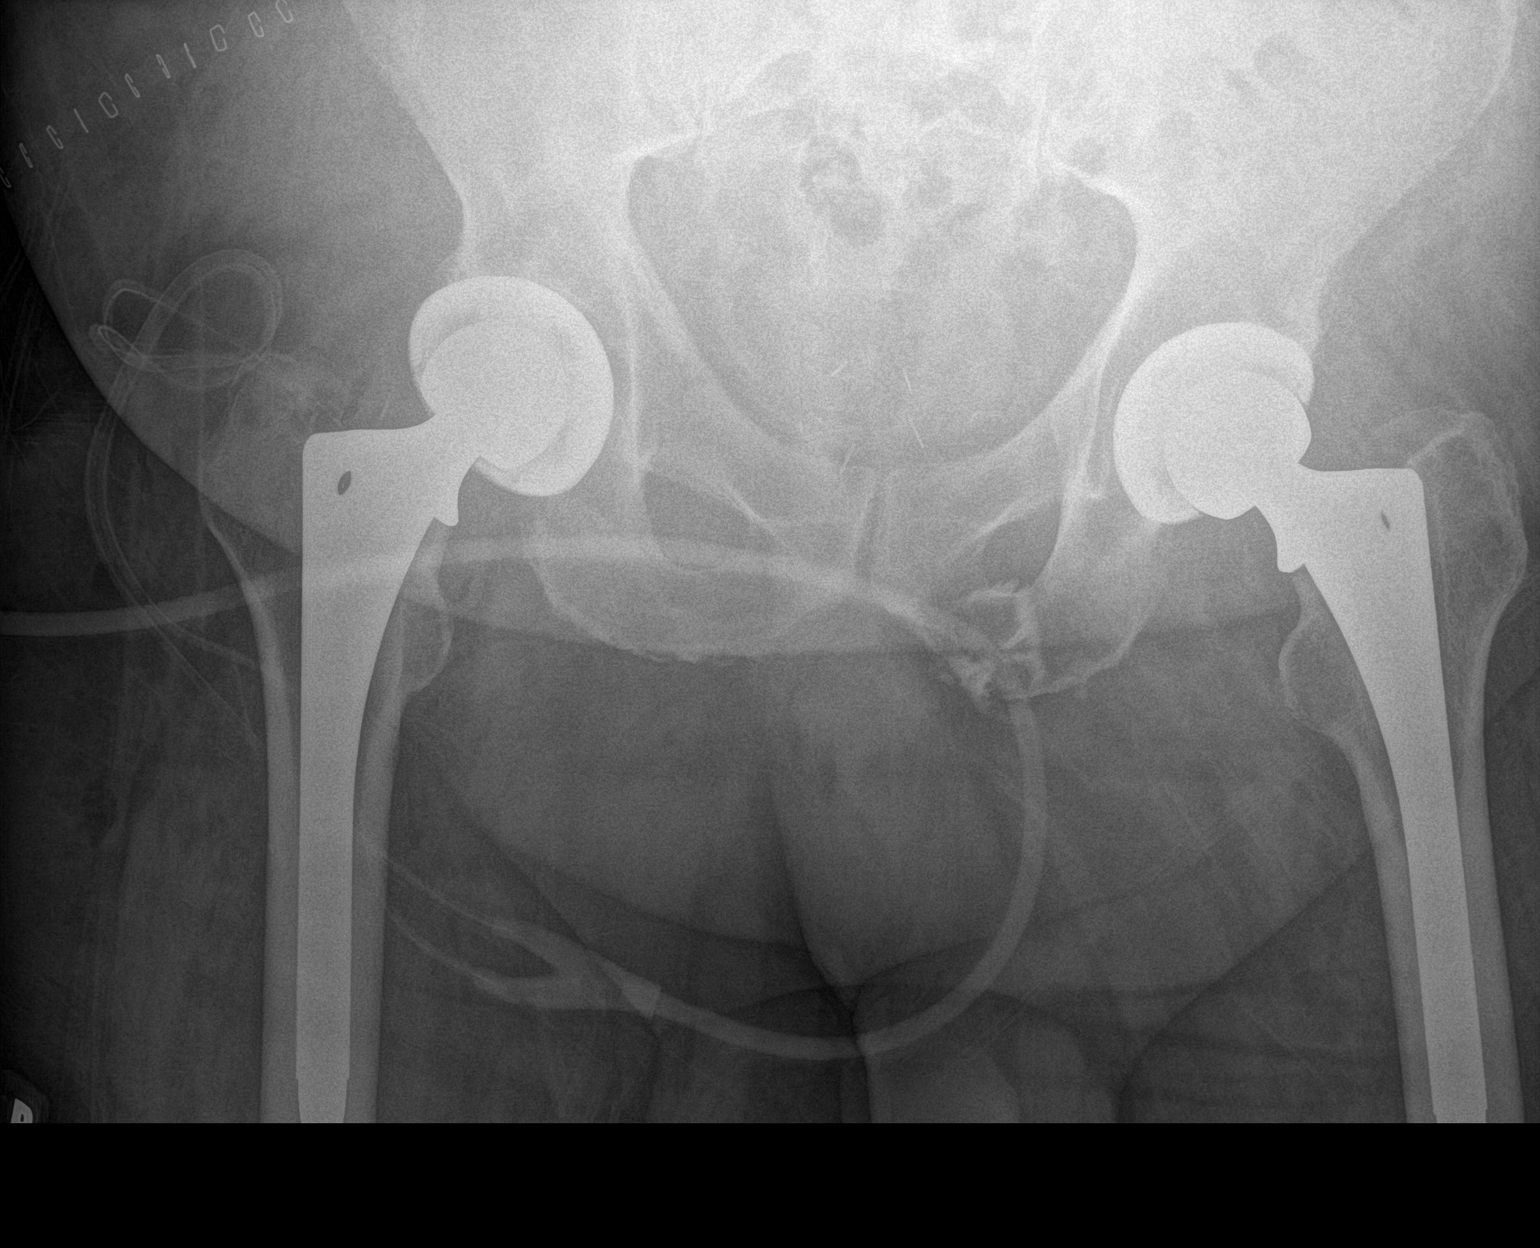

[hip lat]
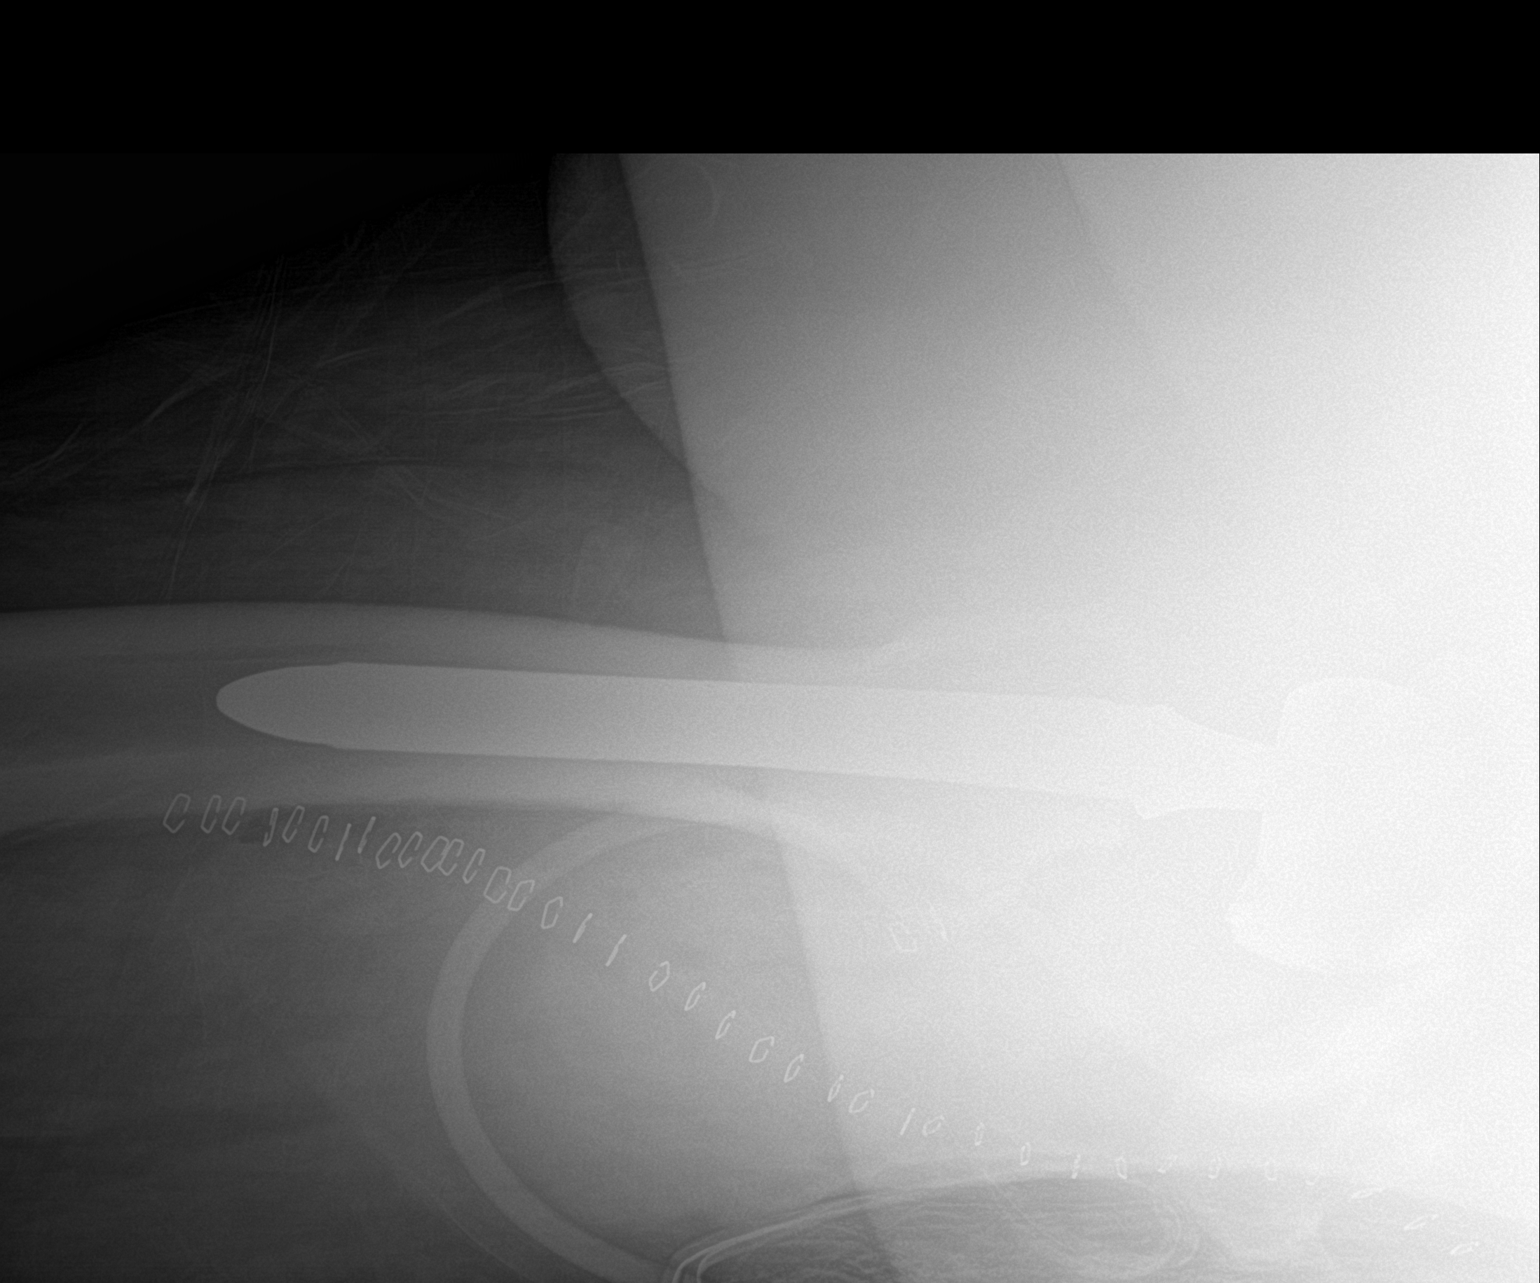

[2 of 2 positions shown; findings below may reference images not displayed]

FINDINGS: Portable AP pelvis and cross-table lateral view of the right hip.
The upper pelvis is excluded. Patient has undergone interval right
total hip arthroplasty without apparent complication. There is no
evidence of acute fracture or dislocation. Patient has undergone
previous left total hip arthroplasty. There is a small amount of gas
in the soft tissue surrounding the right hip. Skin staples are in
place.
IMPRESSION: No demonstrated complication following right total hip arthroplasty.

## 2022-03-16 ENCOUNTER — Other Ambulatory Visit: Payer: Self-pay | Admitting: Physician Assistant

## 2022-04-04 ENCOUNTER — Other Ambulatory Visit: Payer: Self-pay | Admitting: Physician Assistant

## 2022-04-19 ENCOUNTER — Encounter: Payer: Self-pay | Admitting: Urology

## 2022-06-15 ENCOUNTER — Other Ambulatory Visit: Payer: Self-pay | Admitting: Physician Assistant

## 2022-07-03 ENCOUNTER — Other Ambulatory Visit: Payer: Self-pay | Admitting: Orthopedic Surgery

## 2022-07-03 DIAGNOSIS — M4727 Other spondylosis with radiculopathy, lumbosacral region: Secondary | ICD-10-CM

## 2022-07-10 ENCOUNTER — Ambulatory Visit
Admission: RE | Admit: 2022-07-10 | Discharge: 2022-07-10 | Disposition: A | Discharge status: Home / Self Care | Payer: Medicare Other | Source: Ambulatory Visit | Attending: Orthopedic Surgery | Admitting: Orthopedic Surgery

## 2022-07-10 DIAGNOSIS — M4727 Other spondylosis with radiculopathy, lumbosacral region: Secondary | ICD-10-CM | POA: Insufficient documentation

## 2022-07-17 ENCOUNTER — Other Ambulatory Visit: Payer: Self-pay | Admitting: Internal Medicine

## 2022-07-17 DIAGNOSIS — R591 Generalized enlarged lymph nodes: Secondary | ICD-10-CM

## 2022-07-31 ENCOUNTER — Ambulatory Visit
Admission: RE | Admit: 2022-07-31 | Discharge: 2022-07-31 | Disposition: A | Discharge status: Home / Self Care | Payer: Medicare Other | Source: Ambulatory Visit | Attending: Internal Medicine | Admitting: Internal Medicine

## 2022-07-31 DIAGNOSIS — R591 Generalized enlarged lymph nodes: Secondary | ICD-10-CM | POA: Diagnosis present

## 2022-07-31 MED ORDER — IOHEXOL 300 MG/ML  SOLN
100.0000 mL | Freq: Once | INTRAMUSCULAR | Status: AC | PRN
  Administered 2022-07-31: 100 mL via INTRAVENOUS

## 2022-08-01 ENCOUNTER — Other Ambulatory Visit: Payer: Self-pay | Admitting: *Deleted

## 2022-08-01 DIAGNOSIS — R9389 Abnormal findings on diagnostic imaging of other specified body structures: Secondary | ICD-10-CM

## 2022-08-01 DIAGNOSIS — N5089 Other specified disorders of the male genital organs: Secondary | ICD-10-CM

## 2022-08-01 DIAGNOSIS — N400 Enlarged prostate without lower urinary tract symptoms: Secondary | ICD-10-CM

## 2022-08-02 ENCOUNTER — Other Ambulatory Visit: Payer: Self-pay | Admitting: *Deleted

## 2022-08-02 DIAGNOSIS — N5089 Other specified disorders of the male genital organs: Secondary | ICD-10-CM

## 2022-08-02 DIAGNOSIS — R9389 Abnormal findings on diagnostic imaging of other specified body structures: Secondary | ICD-10-CM

## 2022-08-05 ENCOUNTER — Encounter: Payer: Self-pay | Admitting: Oncology

## 2022-08-05 ENCOUNTER — Inpatient Hospital Stay: Payer: Medicare Other | Attending: Oncology | Admitting: Oncology

## 2022-08-05 ENCOUNTER — Inpatient Hospital Stay: Payer: Medicare Other

## 2022-08-05 VITALS — BP 125/69 | HR 81 | Temp 97.2°F | Resp 18 | Ht 69.0 in | Wt 267.0 lb

## 2022-08-05 DIAGNOSIS — R590 Localized enlarged lymph nodes: Secondary | ICD-10-CM | POA: Diagnosis present

## 2022-08-05 DIAGNOSIS — Z7901 Long term (current) use of anticoagulants: Secondary | ICD-10-CM | POA: Diagnosis not present

## 2022-08-05 DIAGNOSIS — I4891 Unspecified atrial fibrillation: Secondary | ICD-10-CM | POA: Insufficient documentation

## 2022-08-05 DIAGNOSIS — R9389 Abnormal findings on diagnostic imaging of other specified body structures: Secondary | ICD-10-CM | POA: Insufficient documentation

## 2022-08-05 DIAGNOSIS — Z800 Family history of malignant neoplasm of digestive organs: Secondary | ICD-10-CM | POA: Insufficient documentation

## 2022-08-05 DIAGNOSIS — M549 Dorsalgia, unspecified: Secondary | ICD-10-CM | POA: Diagnosis not present

## 2022-08-05 DIAGNOSIS — R599 Enlarged lymph nodes, unspecified: Secondary | ICD-10-CM

## 2022-08-05 DIAGNOSIS — I10 Essential (primary) hypertension: Secondary | ICD-10-CM | POA: Diagnosis not present

## 2022-08-05 DIAGNOSIS — N5089 Other specified disorders of the male genital organs: Secondary | ICD-10-CM | POA: Diagnosis present

## 2022-08-05 DIAGNOSIS — R5383 Other fatigue: Secondary | ICD-10-CM | POA: Insufficient documentation

## 2022-08-05 DIAGNOSIS — Z79899 Other long term (current) drug therapy: Secondary | ICD-10-CM | POA: Insufficient documentation

## 2022-08-05 DIAGNOSIS — Z8719 Personal history of other diseases of the digestive system: Secondary | ICD-10-CM | POA: Diagnosis not present

## 2022-08-05 DIAGNOSIS — M545 Low back pain, unspecified: Secondary | ICD-10-CM | POA: Diagnosis not present

## 2022-08-05 DIAGNOSIS — N400 Enlarged prostate without lower urinary tract symptoms: Secondary | ICD-10-CM | POA: Diagnosis not present

## 2022-08-05 DIAGNOSIS — M48061 Spinal stenosis, lumbar region without neurogenic claudication: Secondary | ICD-10-CM | POA: Diagnosis not present

## 2022-08-05 DIAGNOSIS — Z833 Family history of diabetes mellitus: Secondary | ICD-10-CM | POA: Diagnosis not present

## 2022-08-05 DIAGNOSIS — Z818 Family history of other mental and behavioral disorders: Secondary | ICD-10-CM | POA: Insufficient documentation

## 2022-08-05 LAB — CBC WITH DIFFERENTIAL/PLATELET
Abs Immature Granulocytes: 0.07 10*3/uL (ref 0.00–0.07)
Basophils Absolute: 0.1 10*3/uL (ref 0.0–0.1)
Basophils Relative: 1 %
Eosinophils Absolute: 0.2 10*3/uL (ref 0.0–0.5)
Eosinophils Relative: 2 %
HCT: 37.8 % — ABNORMAL LOW (ref 39.0–52.0)
Hemoglobin: 12.9 g/dL — ABNORMAL LOW (ref 13.0–17.0)
Immature Granulocytes: 1 %
Lymphocytes Relative: 17 %
Lymphs Abs: 2 10*3/uL (ref 0.7–4.0)
MCH: 31.6 pg (ref 26.0–34.0)
MCHC: 34.1 g/dL (ref 30.0–36.0)
MCV: 92.6 fL (ref 80.0–100.0)
Monocytes Absolute: 0.9 10*3/uL (ref 0.1–1.0)
Monocytes Relative: 8 %
Neutro Abs: 8.2 10*3/uL — ABNORMAL HIGH (ref 1.7–7.7)
Neutrophils Relative %: 71 %
Platelets: 371 10*3/uL (ref 150–400)
RBC: 4.08 MIL/uL — ABNORMAL LOW (ref 4.22–5.81)
RDW: 13 % (ref 11.5–15.5)
WBC: 11.4 10*3/uL — ABNORMAL HIGH (ref 4.0–10.5)
nRBC: 0 % (ref 0.0–0.2)

## 2022-08-05 LAB — COMPREHENSIVE METABOLIC PANEL
ALT: 17 U/L (ref 0–44)
AST: 15 U/L (ref 15–41)
Albumin: 3.8 g/dL (ref 3.5–5.0)
Alkaline Phosphatase: 99 U/L (ref 38–126)
Anion gap: 11 (ref 5–15)
BUN: 22 mg/dL (ref 8–23)
CO2: 24 mmol/L (ref 22–32)
Calcium: 8.6 mg/dL — ABNORMAL LOW (ref 8.9–10.3)
Chloride: 92 mmol/L — ABNORMAL LOW (ref 98–111)
Creatinine, Ser: 0.77 mg/dL (ref 0.61–1.24)
GFR, Estimated: >60 mL/min (ref >60)
Glucose, Bld: 125 mg/dL — ABNORMAL HIGH (ref 70–99)
Potassium: 3.8 mmol/L (ref 3.5–5.1)
Sodium: 127 mmol/L — ABNORMAL LOW (ref 135–145)
Total Bilirubin: 0.5 mg/dL (ref 0.3–1.2)
Total Protein: 7.6 g/dL (ref 6.5–8.1)

## 2022-08-05 LAB — LACTATE DEHYDROGENASE: LDH: 100 U/L (ref 98–192)

## 2022-08-05 NOTE — Progress Notes (Signed)
Patient has some questions about lymph nodes for the provider.

## 2022-08-05 NOTE — Progress Notes (Signed)
Hematology/Oncology Consult note [facility]  [phone] [phone]  Patient Care Team: Unice Bailey, MD as PCP - General (Rheumatology) Wellington Hampshire, MD as PCP - Cardiology (Cardiology) Marry Guan, Laurice Record, MD (Orthopedic Surgery) Nobie Putnam, MD (Hematology and Oncology)   Name of the patient: [name] [name]  [id number]  1946/04/03   Date of visit: 08/05/22  Diagnosis-intra-abdominal adenopathy  Chief complaint/ Reason for visit- patient seen for anemia in the past now referred for intra-abdominal adenopathy  Heme/Onc history: Patient is a 77 year old male who has seen me in the past for iron deficiency.  More recently patient underwent MRI lumbar spine in January 2024 for symptoms of low back pain which showed evidence of spinal stenosis but incidentally showed a prominent periaortic lymph node measuring 1.5 cm.  This was followed by a CT abdomen and pelvis with contrast which showed bilateral adrenal adenomas.  He was also noted to have external iliac lymphadenopathy measuring 2.4 x 1.9 cm and a common iliac lymph node measuring 3.3 x 1.5 cm.  Masslike area identified in the scrotum of on the left side measuring 6.6 x 6.1 cm.  11 mm cystic lesion in the uncinate process of pancreas for which MRI was recommended in 2 years.  Interval history-patient is presently doing well for his age.  He reports he has had hydrocele involving the left scrotum and has undergone drainage frequently at least 5-6 times in the past with the last drainage that was done about a year and a half ago.  He also has chronically enlarged prostate gland his PSA runs around 2.  ECOG PS- 1 Pain scale- 0   Review of systems- Review of Systems  Constitutional:  Positive for malaise/fatigue. Negative for chills, fever and weight loss.  HENT:  Negative for congestion, ear discharge and nosebleeds.   Eyes:  Negative for blurred vision.  Respiratory:  Negative for  cough, hemoptysis, sputum production, shortness of breath and wheezing.   Cardiovascular:  Negative for chest pain, palpitations, orthopnea and claudication.  Gastrointestinal:  Negative for abdominal pain, blood in stool, constipation, diarrhea, heartburn, melena, nausea and vomiting.  Genitourinary:  Negative for dysuria, flank pain, frequency, hematuria and urgency.  Musculoskeletal:  Positive for back pain. Negative for joint pain and myalgias.  Skin:  Negative for rash.  Neurological:  Negative for dizziness, tingling, focal weakness, seizures, weakness and headaches.  Endo/Heme/Allergies:  Does not bruise/bleed easily.  Psychiatric/Behavioral:  Negative for depression and suicidal ideas. The patient does not have insomnia.       No Known Allergies   Past Medical History:  Diagnosis Date   Anemia    iron deficiency and b12 deficiency.  followed by oncology   Anxiety    Arthritis    Atrial fibrillation ([place]) 12/28/2020   BPH (benign prostatic hyperplasia)    Diabetes mellitus    Diverticulosis    Elevated platelet count    Hemorrhoids    HOH (hard of hearing)    wears hearing aides   Hypertension    OSA on CPAP    Prostate disorder      Past Surgical History:  Procedure Laterality Date   CARDIOVERSION N/A 02/19/2021   Procedure: CARDIOVERSION;  Surgeon: Wellington Hampshire, MD;  Location: [facility] ORS;  Service: Cardiovascular;  Laterality: N/A;   CATARACT EXTRACTION W/PHACO Left 07/14/2018   Procedure: CATARACT EXTRACTION PHACO AND INTRAOCULAR LENS PLACEMENT ([place]) LEFT, DIABETIC;  Surgeon: Birder Robson, MD;  Location: [facility] OR[facility];  Service: Ophthalmology;  Laterality: Left;  [place] 00:50 CDE 8.93 Fluid pack lot # [id number]   CATARACT EXTRACTION W/PHACO Right 09/01/2018   Procedure: CATARACT EXTRACTION PHACO AND INTRAOCULAR LENS PLACEMENT (IOC)-RIGHT, DIABETIC;  Surgeon: Birder Robson, MD;  Location: [facility] OR[facility];  Service: Ophthalmology;  Laterality: Right;  [place] 00:50.9 CDE  6.80 Fluid Pack Lot # [id number]     COLONOSCOPY     CYSTOSCOPY WITH INSERTION OF UROLIFT     CYSTOSCOPY WITH INSERTION OF UROLIFT  08/2018   ESOPHAGOGASTRODUODENOSCOPY (EGD) WITH PROPOFOL N/A 03/17/2019   Procedure: ESOPHAGOGASTRODUODENOSCOPY (EGD) WITH PROPOFOL;  Surgeon: Toledo, Benay Pike, MD;  Location: [facility];  Service: Gastroenterology;  Laterality: N/A;   EYE SURGERY Bilateral    cataract surgery   JOINT REPLACEMENT Bilateral 2014   Total knee replacement.   REPLACEMENT TOTAL KNEE Bilateral    TOTAL HIP ARTHROPLASTY Left 02/23/2020   Procedure: TOTAL HIP ARTHROPLASTY;  Surgeon: Dereck Leep, MD;  Location: [facility] OR[facility];  Service: Orthopedics;  Laterality: Left;   TOTAL HIP ARTHROPLASTY Right 01/10/2021   Procedure: TOTAL HIP ARTHROPLASTY;  Surgeon: Dereck Leep, MD;  Location: [facility] OR[facility];  Service: Orthopedics;  Laterality: Right;    Social History   Socioeconomic History   Marital status: Married    Spouse name: Bonnita Nasuti   Number of children: 3   Years of education: Not on file   Highest education level: Not on file  Occupational History   Occupation: Higher education careers adviser, Clinical biochemist    Comment: retired  Tobacco Use   Smoking status: Never   Smokeless tobacco: Never  Vaping Use   Vaping Use: Never used  Substance and Sexual Activity   Alcohol use: Yes    Comment: wine-occasionally Previously drank a shot of vodka in the evenings   Drug use: Never   Sexual activity: Not on file  Other Topics Concern   Not on file  Social History Narrative   Patient lives with wife.  Feels safe   Social Determinants of Radio broadcast assistant Strain: Not on file  Food Insecurity: Not on file  Transportation Needs: Not on file  Physical Activity: Not on file  Stress: Not on file  Social Connections: Not on file  Intimate Partner Violence: Not on file    Family History  Problem Relation Age of Onset   Diabetes Mother    Alzheimer's disease Mother    Colon cancer Sister       Current Outpatient Medications:    acetaminophen (TYLENOL) 650 MG CR tablet, Take 1,300 mg by mouth every 8 (eight) hours as needed for pain., Disp: , Rfl:    ALPRAZolam (XANAX) 0.5 MG tablet, Take 1 mg by mouth at bedtime., Disp: , Rfl:    amLODipine (NORVASC) 10 MG tablet, Take 1 tablet (10 mg total) by mouth daily., Disp: 90 tablet, Rfl: 0   amoxicillin (AMOXIL) 500 MG capsule, Take 2,000 mg by mouth as needed (Prior to dental procedures)., Disp: , Rfl:    atorvastatin (LIPITOR) 40 MG tablet, TAKE 1 TABLET BY MOUTH DAILY, Disp: 90 tablet, Rfl: 3   benazepril-hydrochlorthiazide (LOTENSIN HCT) 20-12.5 MG tablet, Take 1 tablet by mouth every evening., Disp: , Rfl:    ELIQUIS 5 MG TABS tablet, TAKE 1 TABLET BY MOUTH 2 TIMES DAILY., Disp: 60 tablet, Rfl: 5   flecainide (TAMBOCOR) 50 MG tablet, TAKE ONE TABLET BY MOUTH TWICE DAILY, Disp: 180 tablet, Rfl: 0   glimepiride (AMARYL) 1 MG tablet, Take 1 mg by mouth daily with  breakfast., Disp: , Rfl:    Iron-Vitamins (GERITOL PO), Take by mouth as needed., Disp: , Rfl:    JARDIANCE 10 MG TABS tablet, Take 10 mg by mouth daily., Disp: , Rfl:    metFORMIN (GLUCOPHAGE-XR) 500 MG 24 hr tablet, Take 1,500 mg by mouth daily., Disp: , Rfl:    mirabegron ER (MYRBETRIQ) 50 MG TB24 tablet, Take 1 tablet (50 mg total) by mouth daily., Disp: 28 tablet, Rfl: 0   naphazoline-glycerin (CLEAR EYES REDNESS) 0.012-0.25 % SOLN, Place 1-2 drops into both eyes 4 (four) times daily as needed for eye irritation., Disp: , Rfl:    OVER THE COUNTER MEDICATION, Blood builder-1 capsule by mouth daily, Disp: , Rfl:    OZEMPIC, 0.25 OR 0.5 MG/DOSE, 2 MG/3ML SOPN, Inject 0.5 mg into the skin once a week., Disp: , Rfl:    Semaglutide (RYBELSUS) 14 MG TABS, Take 14 mg by mouth every morning., Disp: , Rfl:    sertraline (ZOLOFT) 50 MG tablet, Take 50 mg by mouth every morning., Disp: , Rfl:    tamsulosin (FLOMAX) 0.4 MG CAPS capsule, TAKE 2 CAPSULES BY MOUTH DAILY AFTER BREAKFAST,  Disp: 180 capsule, Rfl: 3   vitamin B-12 (CYANOCOBALAMIN) 1000 MCG tablet, Take 1,000 mcg by mouth daily., Disp: , Rfl:    zinc gluconate 50 MG tablet, Take 50 mg by mouth daily., Disp: , Rfl:    metoprolol tartrate (LOPRESSOR) 25 MG tablet, Take 0.5 tablets (12.5 mg total) by mouth 2 (two) times daily., Disp: 90 tablet, Rfl: 3  Physical exam:  Vitals:   08/05/22 1422  BP: 125/69  Pulse: 81  Resp: 18  Temp: (!) 97.2 F (36.2 C)  TempSrc: Tympanic  SpO2: 91%  Weight: 267 lb (121.1 kg)  Height: '5\' 9"'$  (1.753 m)   Physical Exam Cardiovascular:     Rate and Rhythm: Normal rate and regular rhythm.     Heart sounds: Normal heart sounds.  Pulmonary:     Effort: Pulmonary effort is normal.     Breath sounds: Normal breath sounds.  Abdominal:     General: Bowel sounds are normal.     Palpations: Abdomen is soft.     Comments: No palpable scrotal masses  Lymphadenopathy:     Comments: No palpable cervical, supraclavicular, axillary or inguinal adenopathy    Skin:    General: Skin is warm and dry.  Neurological:     Mental Status: He is alert and oriented to person, place, and time.         Latest Ref Rng & Units 08/05/2022    3:21 PM  CMP  Glucose 70 - 99 mg/dL 125   BUN 8 - 23 mg/dL 22   Creatinine 0.61 - 1.24 mg/dL 0.77   Sodium 135 - 145 mmol/L 127   Potassium 3.5 - 5.1 mmol/L 3.8   Chloride 98 - 111 mmol/L 92   CO2 22 - 32 mmol/L 24   Calcium 8.9 - 10.3 mg/dL 8.6   Total Protein 6.5 - 8.1 g/dL 7.6   Total Bilirubin 0.3 - 1.2 mg/dL 0.5   Alkaline Phos 38 - 126 U/L 99   AST 15 - 41 U/L 15   ALT 0 - 44 U/L 17       Latest Ref Rng & Units 08/05/2022    3:21 PM  CBC  WBC 4.0 - 10.5 K/uL 11.4   Hemoglobin 13.0 - 17.0 g/dL 12.9   Hematocrit 39.0 - 52.0 % 37.8   Platelets 150 -  400 K/uL 371     No images are attached to the encounter.  CT ABDOMEN PELVIS W CONTRAST  Result Date: 07/31/2022 CLINICAL DATA:  Occasional diarrhea.  Abnormal lymph node. EXAM: CT ABDOMEN  AND PELVIS WITH CONTRAST TECHNIQUE: Multidetector CT imaging of the abdomen and pelvis was performed using the standard protocol following bolus administration of intravenous contrast. RADIATION DOSE REDUCTION: This exam was performed according to the departmental dose-optimization program which includes automated exposure control, adjustment of the mA and/or kV according to patient size and/or use of iterative reconstruction technique. CONTRAST:  155m OMNIPAQUE IOHEXOL 300 MG/ML  SOLN COMPARISON:  Lumbar spine MRI 07/10/2022.  No prior CT FINDINGS: Lower chest: There is some linear opacity along bases likely scar or atelectasis. No pleural effusion. There is some breathing motion. Coronary artery calcifications are noted. Mildly patulous esophagus. Hepatobiliary: No enhancing liver mass. Preserved hepatic parenchyma. Patent portal vein. Gallbladder has a dependent stone is nondilated. Pancreas: Preserved pancreatic parenchyma without obvious mass. There is a cystic lesion however in the uncinate process measuring 11 mm on series 2, image 44. No pancreatic ductal dilatation. Spleen: Spleen is preserved. Adrenals/Urinary Tract: Bilateral adrenal nodules are seen. The left measures 3.1 by 1.6 cm on series 2, image 36 with Hounsfield unit on portal venous phase of 33 and delay 15 consistent with an adenoma with relative washout. Smaller nodule more inferior on the left also seen. On series 2, image 39 this measures 10 mm. Hounsfield unit on portal venous phase of 43 and delay 30. Not clearly an adenoma. Right-sided nodule also seen with diameter on series 2, image 35 measuring 2.2 by 1.2 cm. Hounsfield unit on portal venous phase of 23 and delay 16. With the low-level enhancement under 30, this is consistent with an adenoma. No enhancing renal mass or collecting system dilatation. There is an exophytic Bosniak 1 lateral cyst on the left with diameter of 9.7 cm and Hounsfield unit of 9. No specific imaging follow-up.  No aggressive features such as nodules or septa. Tiny exophytic lesion from the lower pole left kidney as well best seen on coronal image 49 of series 5 measuring 6 mm, too small to characterize but also has benign features. Ureters have normal course and caliber down to the bladder. Only slight ectasia of the distal ureters, nonspecific. The bladder wall slightly thickened and trabeculated. There is an enlarged prostate. Please correlate with patient's PSA. Stomach/Bowel: On this non oral contrast exam, the large bowel has a normal course and caliber with scattered diffuse colonic stool. Diffuse colonic diverticulosis. Normal retrocecal appendix in the right lower quadrant. The stomach is nondilated. Small bowel is nondilated. Vascular/Lymphatic: Tortuous course of the abdominal aorta but no dissection or aneurysm formation. Minimal atherosclerotic plaque. Preserved IVC. There are some enlarged left-sided pelvic sidewall nodes, external iliac. On image 81 of series 2 this node would measure 2.4 x 1.9 cm. Additional smaller but prominent nodes identified as well. The lesion seen by MRI corresponds to a left common iliac chain node measuring 3.3 x 1.5 cm. Some of these nodes have relatively preserved morphology but are clearly abnormally enlarged. Nodes in the upper abdomen are not enlarged nondilated. Reproductive: Enlarged prostate. There is a masslike area identified along the scrotum on the left measuring on the very last image, series 2, image 111 measuring 6.6 by 6.1 cm. Recommend further evaluation such as ultrasound. Mass lesion is possible. Other: Anasarca. Left-sided fat containing moderate inguinal hernia. Musculoskeletal: Curvature of the spine. Scattered degenerative  changes. Multilevel lumbar spine areas of stenosis. Please correlate with the previous MRI of the lumbar spine. Bilateral hip arthroplasties are seen which cause significant streak artifact obscuring significant portions of the pelvis.  IMPRESSION: 1. At the edge of the imaging field is a masslike area involving the left hemiscrotum. Neoplastic lesion is possible. Recommend further workup with scrotal ultrasound if there is no known history. 2. Abnormally enlarged left-sided hemipelvic and iliac chain lymph nodes. Recommend further workup. Please correlate with scrotal findings as well. 3. Bilateral adrenal nodules, consistent with adenomas. However there is 1 additional lesion along the inferior aspect of the left adrenal gland which is not clearly an adenoma on this examination. Please correlate with any prior to assess stability or dedicated workup when appropriate such as MRI 4. 11 mm cystic lesion in the uncinate process of the pancreas. Recommend follow up pre and post contrast MRI/MRCP or pancreatic protocol CT in 2 years. 5. Cholelithiasis without evidence of acute cholecystitis. 6. Diffuse colonic diverticulosis without evidence of acute diverticulitis. 7. Enlarged prostate.  Please correlate with patient's PSA The Radiology physician assistant team will call findings to the ordering service. Electronically Signed   By: Jill Side M.D.   On: 07/31/2022 13:41   MR LUMBAR SPINE WO CONTRAST  Result Date: 07/10/2022 CLINICAL DATA:  Low back pain.  Slight pain down legs to knee. EXAM: MRI LUMBAR SPINE WITHOUT CONTRAST TECHNIQUE: Multiplanar, multisequence MR imaging of the lumbar spine was performed. No intravenous contrast was administered. COMPARISON:  None Available. FINDINGS: Segmentation:  Standard. Alignment:  Physiologic. Vertebrae:  No fracture, evidence of discitis, or bone lesion. Conus medullaris and cauda equina: Conus extends to the L1 level. Conus and cauda equina appear normal. Paraspinal and other soft tissues: Fatty atrophy of the bilateral gluteal musculature. There is a prominent periaortic lymph node measuring up to 1.5 cm (series 8, image 27). Disc levels: In the visualized thoracic spine there are small disc bulges  at T10-T11 and T11-T12 without evidence of spinal canal stenosis on the sagittal sequences. T12-L1: No significant disc bulge. Mild bilateral facet degenerative change. No spinal canal stenosis. No neural foraminal stenosis. L1-L2: Mild-to-moderate bilateral facet degenerative change. Eccentric left disc bulge. Short pedicles. Mild-to-moderate spinal canal narrowing. Mild bilateral neural foraminal stenosis. L2-L3: Moderate bilateral facet degenerative change. Circumferential disc bulge. Moderate spinal canal narrowing. Short pedicles. Moderate right and mild left neural foraminal narrowing. L3-L4: Moderate to severe bilateral facet degenerative change. Ligamentum flavum hypertrophy. Epidural lipomatosis. Moderate bony spinal canal narrowing and severe thecal sac narrowing. Moderate bilateral foraminal narrowing. L4-L5: Severe bilateral facet degenerative change. Circumferential disc bulge, eccentric to the left. The extraforaminal component of the disc bulge likely contacts the exited left L4 nerve root (series 8, image 28). Severe spinal canal narrowing. Moderate right and severe left neural foraminal narrowing. L5-S1: Moderate bilateral facet degenerative change. Circumferential disc bulge. Moderate spinal canal narrowing. Moderate to severe left and mild-to-moderate right neural foraminal narrowing. IMPRESSION: 1. 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 2. Severe neuroforaminal narrowing at L4-L5 (left) and L5-S1 (left) 3. Additional degenerative changes, as above. 4. Prominent periaortic lymph node measuring up to 1.5 cm, nonspecific. Correlate with PSA and consider further evaluation with a dedicated CT of the abdomen and pelvis. Electronically Signed   By: Marin Roberts M.D.   On: 07/10/2022 09:20  Assessment and plan- Patient is a 77 y.o.  male referred for intra-abdominal adenopathy and possible scrotal mass  Patient is getting a dedicated ultrasound of the scrotum to assess for any scrotal mass although clinically I do not palpate any mass.  He was found to have external iliac and common iliac adenopathy measuring between 2.5 to 3.5 cm.  I will plan to discuss these findings at tumor board this week.  We may have to proceed with a CT-guided biopsy of the external iliac lymph nodes if they are accessible.  I will reach out to the patient after consensus from tumor board is obtained.  I am checking baseline tumor markers including LDH alpha-fetoprotein and beta hCG today.  Likelihood of a primary testicular tumor is presently low   Visit Diagnosis 1. Mass of scrotum   2. Adenopathy      Dr. Randa Evens, MD, MPH [facility] [phone] 08/05/2022 4:37 PM

## 2022-08-06 LAB — AFP TUMOR MARKER: AFP, Serum, Tumor Marker: <1.8 ng/mL (ref 0.0–8.4)

## 2022-08-06 LAB — BETA HCG QUANT (REF LAB): hCG Quant: <1 m[IU]/mL (ref 0–3)

## 2022-08-07 ENCOUNTER — Other Ambulatory Visit: Payer: Self-pay | Admitting: Physician Assistant

## 2022-08-07 ENCOUNTER — Ambulatory Visit
Admission: RE | Admit: 2022-08-07 | Discharge: 2022-08-07 | Disposition: A | Discharge status: Home / Self Care | Payer: Medicare Other | Source: Ambulatory Visit | Attending: Oncology | Admitting: Oncology

## 2022-08-07 DIAGNOSIS — N5089 Other specified disorders of the male genital organs: Secondary | ICD-10-CM | POA: Insufficient documentation

## 2022-08-07 DIAGNOSIS — R9389 Abnormal findings on diagnostic imaging of other specified body structures: Secondary | ICD-10-CM | POA: Diagnosis not present

## 2022-08-08 ENCOUNTER — Other Ambulatory Visit: Payer: Medicare Other

## 2022-08-08 ENCOUNTER — Other Ambulatory Visit: Payer: Self-pay | Admitting: Oncology

## 2022-08-08 ENCOUNTER — Other Ambulatory Visit: Payer: Self-pay | Admitting: *Deleted

## 2022-08-08 DIAGNOSIS — R599 Enlarged lymph nodes, unspecified: Secondary | ICD-10-CM

## 2022-08-08 NOTE — Progress Notes (Deleted)
Cardiology Office Note    Date:  08/08/2022   ID:  [name] [name], DOB Aug 22, 1945, MRN [id number]  PCP:  Unice Bailey, MD  Cardiologist:  [name], MD  Electrophysiologist:  None   Chief Complaint: Follow-up  History of Present Illness:   [name] [name] is a 77 y.o. male with history of ***  ***   Labs independently reviewed: 08/2022 - Hgb 12.9, PLT 371, potassium 3.8, BUN 22, serum creatinine 0.77 03/2022 - A1c 7.6, albumin 3.9, AST/ALT normal, TC 115, TG 95, HDL 43, LDL 52 01/2021 - TSH normal  Past Medical History:  Diagnosis Date   Anemia    iron deficiency and b12 deficiency.  followed by oncology   Anxiety    Arthritis    Atrial fibrillation ([place]) 12/28/2020   BPH (benign prostatic hyperplasia)    Diabetes mellitus    Diverticulosis    Elevated platelet count    Hemorrhoids    HOH (hard of hearing)    wears hearing aides   Hypertension    OSA on CPAP    Prostate disorder     Past Surgical History:  Procedure Laterality Date   CARDIOVERSION N/A 02/19/2021   Procedure: CARDIOVERSION;  Surgeon: Wellington Hampshire, MD;  Location: [facility];  Service: Cardiovascular;  Laterality: N/A;   CATARACT EXTRACTION W/PHACO Left 07/14/2018   Procedure: CATARACT EXTRACTION PHACO AND INTRAOCULAR LENS PLACEMENT ([place]) LEFT, DIABETIC;  Surgeon: Birder Robson, MD;  Location: [facility];  Service: Ophthalmology;  Laterality: Left;  [place] 00:50 CDE 8.93 Fluid pack lot # [id number]   CATARACT EXTRACTION W/PHACO Right 09/01/2018   Procedure: CATARACT EXTRACTION PHACO AND INTRAOCULAR LENS PLACEMENT ([facility])-RIGHT, DIABETIC;  Surgeon: Birder Robson, MD;  Location: [facility];  Service: Ophthalmology;  Laterality: Right;  [place] 00:50.9 CDE 6.80 Fluid Pack Lot # [id number]     COLONOSCOPY     CYSTOSCOPY WITH INSERTION OF UROLIFT     CYSTOSCOPY WITH INSERTION OF UROLIFT  08/2018   ESOPHAGOGASTRODUODENOSCOPY (EGD) WITH PROPOFOL N/A 03/17/2019   Procedure: ESOPHAGOGASTRODUODENOSCOPY (EGD)  WITH PROPOFOL;  Surgeon: Toledo, Benay Pike, MD;  Location: [facility];  Service: Gastroenterology;  Laterality: N/A;   EYE SURGERY Bilateral    cataract surgery   JOINT REPLACEMENT Bilateral 2014   Total knee replacement.   REPLACEMENT TOTAL KNEE Bilateral    TOTAL HIP ARTHROPLASTY Left 02/23/2020   Procedure: TOTAL HIP ARTHROPLASTY;  Surgeon: Dereck Leep, MD;  Location: [facility];  Service: Orthopedics;  Laterality: Left;   TOTAL HIP ARTHROPLASTY Right 01/10/2021   Procedure: TOTAL HIP ARTHROPLASTY;  Surgeon: Dereck Leep, MD;  Location: [facility];  Service: Orthopedics;  Laterality: Right;    Current Medications: No outpatient medications have been marked as taking for the 08/12/22 encounter (Appointment) with Rise Mu, PA-C.    Allergies:   Patient has no known allergies.   Social History   Socioeconomic History   Marital status: Married    Spouse name: Bonnita Nasuti   Number of children: 3   Years of education: Not on file   Highest education level: Not on file  Occupational History   Occupation: Higher education careers adviser, Clinical biochemist    Comment: retired  Tobacco Use   Smoking status: Never   Smokeless tobacco: Never  Vaping Use   Vaping Use: Never used  Substance and Sexual Activity   Alcohol use: Yes    Comment: wine-occasionally Previously drank a shot of vodka in the evenings   Drug use: Never   Sexual activity:  Not on file  Other Topics Concern   Not on file  Social History Narrative   Patient lives with wife.  Feels safe   Social Determinants of Radio broadcast assistant Strain: Not on file  Food Insecurity: Not on file  Transportation Needs: Not on file  Physical Activity: Not on file  Stress: Not on file  Social Connections: Not on file     Family History:  The patient's family history includes Alzheimer's disease in his mother; Colon cancer in his sister; Diabetes in his mother.  ROS:   12-point review of systems is negative unless otherwise noted in  HPI.   EKGs/Labs/Other Studies Reviewed:    Studies reviewed were summarized above. The additional studies were reviewed today:  ETT 07/04/2021:   Exercise capacity was normal. Patient exercised for 5 min and 0 sec. Maximum HR of 160 bpm. MPHR 110.0 %. Peak METS 7.0 .   No ST deviation was noted. There were no arrhythmias during stress. There were no arrhythmias during recovery. ECG was interpretable and without significant changes. The ECG was negative for ischemia.   Normal treadmill stress test with no evidence of arrhythmia. __________   Carlton Adam MPI 03/2021:   No ST deviation was noted.   Pharmacological myocardial perfusion imaging study with no significant  ischemia Normal wall motion, EF estimated at 47%, GI uptake artifact noted No EKG changes concerning for ischemia at peak stress or in recovery. Resting and recovery EKG with frequent PVCs CT attenuation correction images with mild coronary calcification, mild aortic atherosclerosis Low risk scan   Zio patch 03/2021: Patient had a min HR of 63 bpm, max HR of 121 bpm, and avg HR of 81 bpm.  Predominant underlying rhythm was Sinus Rhythm. First Degree AV Block was present. 1  run of Supraventricular Tachycardia occurred lasting 4 beats with a max rate of 96 bpm (avg 92 bpm). Rare PACs. Frequent PVCs with a burden of 9.6%. __________   2D echo 01/03/2021: 1. Left ventricular ejection fraction, by estimation, is 55 to 60%. The  left ventricle has normal function. The left ventricle has no regional  wall motion abnormalities. Left ventricular diastolic parameters are  indeterminate.   2. Right ventricular systolic function is normal. The right ventricular  size is normal.   3. Left atrial size was mildly dilated.   4. The mitral valve is normal in structure. Trivial mitral valve  regurgitation.   5. The aortic valve is tricuspid. Aortic valve regurgitation is not  visualized.   6. The inferior vena cava is normal in size  with greater than 50%  respiratory variability, suggesting right atrial pressure of 3 mmHg.   EKG:  EKG is ordered today.  The EKG ordered today demonstrates ***  Recent Labs: 08/05/2022: ALT 17; BUN 22; Creatinine, Ser 0.77; Hemoglobin 12.9; Platelets 371; Potassium 3.8; Sodium 127  Recent Lipid Panel    Component Value Date/Time   CHOL 170 12/20/2014 1238   TRIG 75 12/20/2014 1238   HDL 65 12/20/2014 1238   CHOLHDL 2.6 12/20/2014 1238   LDLCALC 90 12/20/2014 1238    PHYSICAL EXAM:    VS:  There were no vitals taken for this visit.  BMI: There is no height or weight on file to calculate BMI.  Physical Exam  Wt Readings from Last 3 Encounters:  08/05/22 267 lb (121.1 kg)  02/08/22 271 lb (122.9 kg)  09/27/21 290 lb (131.5 kg)     ASSESSMENT & PLAN:  Persistent A-fib:  Frequent PVCs:  Coronary calcification/aortic atherosclerosis/HLD: LDL  HTN: Blood pressure  Obesity with OSA:   {Are you ordering a CV Procedure (e.g. stress test, cath, DCCV, TEE, etc)?   Press F2        :[id number]}     Disposition: F/u with Dr. Fletcher Anon or an APP in ***.   Medication Adjustments/Labs and Tests Ordered: Current medicines are reviewed at length with the patient today.  Concerns regarding medicines are outlined above. Medication changes, Labs and Tests ordered today are summarized above and listed in the Patient Instructions accessible in Encounters.   Signed, Christell Faith, PA-C 08/08/2022 9:50 AM     [facility] [address] [place], [place] [address] [phone]

## 2022-08-09 ENCOUNTER — Encounter: Payer: Self-pay | Admitting: Oncology

## 2022-08-12 ENCOUNTER — Ambulatory Visit: Payer: Medicare Other | Admitting: Physician Assistant

## 2022-08-13 ENCOUNTER — Ambulatory Visit: Payer: Medicare Other | Attending: Physician Assistant | Admitting: Physician Assistant

## 2022-08-13 ENCOUNTER — Encounter: Payer: Self-pay | Admitting: Physician Assistant

## 2022-08-13 VITALS — BP 130/74 | HR 77 | Ht 69.5 in | Wt 268.8 lb

## 2022-08-13 DIAGNOSIS — I10 Essential (primary) hypertension: Secondary | ICD-10-CM | POA: Diagnosis not present

## 2022-08-13 DIAGNOSIS — I2584 Coronary atherosclerosis due to calcified coronary lesion: Secondary | ICD-10-CM

## 2022-08-13 DIAGNOSIS — R0602 Shortness of breath: Secondary | ICD-10-CM

## 2022-08-13 DIAGNOSIS — I4819 Other persistent atrial fibrillation: Secondary | ICD-10-CM | POA: Diagnosis not present

## 2022-08-13 DIAGNOSIS — I2510 Atherosclerotic heart disease of native coronary artery without angina pectoris: Secondary | ICD-10-CM

## 2022-08-13 DIAGNOSIS — I493 Ventricular premature depolarization: Secondary | ICD-10-CM | POA: Diagnosis not present

## 2022-08-13 DIAGNOSIS — I700 Atherosclerosis of aorta: Secondary | ICD-10-CM

## 2022-08-13 DIAGNOSIS — E785 Hyperlipidemia, unspecified: Secondary | ICD-10-CM

## 2022-08-13 NOTE — Progress Notes (Signed)
Cardiology Office Note    Date:  08/13/2022   ID:  [name] [name], DOB 06/19/1946, MRN [id number]  PCP:  Unice Bailey, MD  Cardiologist:  [name], MD  Electrophysiologist:  None   Chief Complaint: Follow-up  History of Present Illness:   [name] [name] is a 77 y.o. male with history of coronary artery calcification and aortic atherosclerosis, persistent A. Fib status post DCCV on 02/19/2021, PVCs, DM2, HTN, HLD, iron deficiency anemia requiring IV iron, osteoarthritis, diverticulosis, OSA on CPAP, BPH, and obesity who presents for follow-up of coronary calcification, A-fib, and PVCs.   He was seen as a new patient by Dr. Fletcher Anon on 01/02/2021 for preoperative cardiovascular risk stratification before total hip arthroplasty given preop EKG 12/28/2020 showed newly diagnosed A. fib.  At his visit with our office in 12/2020, he was not aware of any prior history of arrhythmia.  He had no previously known cardiac history.  He denied any symptoms of angina or decompensation.  He remained in A. fib with ventricular rates in the 90s bpm.  Given his CHA2DS2-VASc of 4, initiation of Eliquis was recommended following hip surgery.  He was not requiring rate controlling medications.  Echo on 01/03/2021 demonstrated an EF of 55 to 60%, no regional wall motion abnormalities, indeterminate LV diastolic function parameters, normal RV systolic function and ventricular cavity size, mildly dilated left atrium, trivial mitral regurgitation, and an estimated right atrial pressure of 3 mmHg.  He was seen in follow-up on 02/08/2021 and was doing well from a cardiac perspective.  He remained in A. fib with controlled ventricular response.  He was started on carvedilol 3.125 mg twice daily.  He had been adherent to anticoagulation.  He subsequently underwent successful DCCV on 02/19/2021.  He was seen in the office on 03/16/2021 and was doing reasonably well.  He did continue to note some fatigue, though his activities were  slowly improving.  He was maintaining sinus rhythm with frequent PVCs noted in a pattern of ventricular trigeminy.  Carvedilol was titrated to 6.25 mg twice daily.  Subsequent 3-day Zio patch showed a predominant rhythm of sinus with an average rate of 81 bpm (range 63 to 121 bpm), first-degree AV block, 1 run of SVT lasting 4 beats, rare PACs, and frequent PVCs with a burden of 9.6%.  Given underlying first-degree AV block, further titration of beta-blocker was deferred.  He was seen in the office on 04/09/2021 and continued to do well from a cardiac perspective.  He did note an improvement in his shortness of breath and exertional fatigue a couple of days prior to that visit.  He was maintained on carvedilol 6.25 mg twice daily with a noted stable first-degree AV block.  PVC burden was improved on twelve-lead EKG in the office.  Given exertional fatigue and shortness of breath symptoms, and in the context of frequent PVCs, he underwent Lexiscan MPI on 04/13/2021 which showed no significant ischemia with normal wall motion, EF estimated at 47% with GI uptake artifact noted (echo in 12/2020 demonstrated preserved LVSF), resting and recovery EKGs with frequent PVCs.  The study was overall low risk.  CT attenuated corrected images did show a mild coronary artery calcification and mild aortic atherosclerosis.  Given ectopy noted on EKG tracings during stress test, carvedilol was discontinued and he was started on Lopressor 25 mg twice daily.   He was seen in the office on 06/11/2021 and was doing well from a cardiac perspective.  He had tolerated transition  from carvedilol to metoprolol without issue.  He felt like his energy was improving.  He was maintaining sinus rhythm with a first-degree AV block with frequent PVCs.  Metoprolol was decreased and he was initiated on flecainide.  Subsequent ETT on 07/04/2021 was normal and without evidence of arrhythmia as outlined below.   He was last seen in the office in 01/2022  and remained without symptoms of angina or decompensation.  No changes were indicated.  Since we last saw him, he has undergone extensive evaluation regarding back pain and spinal stenosis with incidental findings of enlarged lymph nodes and scrotal mass felt to be a large fat-containing left inguinal hernia as well as a large hydrocele, bilateral adrenal nodules, cystic lesion of the pancreas, cholelithiasis without evidence of cholecystitis, diffuse colonic diverticulosis, and enlarged prostate.  He comes in to well from a cardiac perspective and is without symptoms of angina or decompensation.  No palpitations, dizziness, presyncope, or syncope.  He does feel like he may be a little more short of breath when compared to prior visits.  However, he is uncertain if this is just some of the stress weighing on him as he navigates his above imaging and evaluation.  No significant lower extremity swelling, abdominal tension, or progressive orthopnea.  No falls or symptoms concerning for bleeding.  His main limiting factor at this point is spinal stenosis, and in this setting he has not been able to play golf over the past 2 to 3 months.   Labs independently reviewed: 08/2022 - Hgb 12.9, PLT 371, potassium 3.8, BUN 22, serum creatinine 0.77, albumin 3.8, AST/ALT normal 03/2022 - A1c 7.6, TC 115, TG 95, HDL 43, LDL 52 03/2021 - magnesium 2.0 01/2021 - TSH normal  Past Medical History:  Diagnosis Date   Anemia    iron deficiency and b12 deficiency.  followed by oncology   Anxiety    Arthritis    Atrial fibrillation ([place]) 12/28/2020   BPH (benign prostatic hyperplasia)    Diabetes mellitus    Diverticulosis    Elevated platelet count    Hemorrhoids    HOH (hard of hearing)    wears hearing aides   Hypertension    OSA on CPAP    Prostate disorder    Spinal stenosis     Past Surgical History:  Procedure Laterality Date   CARDIOVERSION N/A 02/19/2021   Procedure: CARDIOVERSION;  Surgeon: Wellington Hampshire, MD;  Location: [facility];  Service: Cardiovascular;  Laterality: N/A;   CATARACT EXTRACTION W/PHACO Left 07/14/2018   Procedure: CATARACT EXTRACTION PHACO AND INTRAOCULAR LENS PLACEMENT ([place]) LEFT, DIABETIC;  Surgeon: Birder Robson, MD;  Location: [facility];  Service: Ophthalmology;  Laterality: Left;  [place] 00:50 CDE 8.93 Fluid pack lot # [id number]   CATARACT EXTRACTION W/PHACO Right 09/01/2018   Procedure: CATARACT EXTRACTION PHACO AND INTRAOCULAR LENS PLACEMENT ([facility])-RIGHT, DIABETIC;  Surgeon: Birder Robson, MD;  Location: [facility];  Service: Ophthalmology;  Laterality: Right;  [place] 00:50.9 CDE 6.80 Fluid Pack Lot # [id number]     COLONOSCOPY     CYSTOSCOPY WITH INSERTION OF UROLIFT     CYSTOSCOPY WITH INSERTION OF UROLIFT  08/2018   ESOPHAGOGASTRODUODENOSCOPY (EGD) WITH PROPOFOL N/A 03/17/2019   Procedure: ESOPHAGOGASTRODUODENOSCOPY (EGD) WITH PROPOFOL;  Surgeon: Toledo, Benay Pike, MD;  Location: [facility];  Service: Gastroenterology;  Laterality: N/A;   EYE SURGERY Bilateral    cataract surgery   JOINT REPLACEMENT Bilateral 2014   Total knee replacement.   REPLACEMENT TOTAL KNEE Bilateral  TOTAL HIP ARTHROPLASTY Left 02/23/2020   Procedure: TOTAL HIP ARTHROPLASTY;  Surgeon: Dereck Leep, MD;  Location: [facility];  Service: Orthopedics;  Laterality: Left;   TOTAL HIP ARTHROPLASTY Right 01/10/2021   Procedure: TOTAL HIP ARTHROPLASTY;  Surgeon: Dereck Leep, MD;  Location: [facility];  Service: Orthopedics;  Laterality: Right;    Current Medications: Current Meds  Medication Sig   acetaminophen (TYLENOL) 650 MG CR tablet Take 1,300 mg by mouth every 8 (eight) hours as needed for pain.   ALPRAZolam (XANAX) 0.5 MG tablet Take 1 mg by mouth at bedtime.   amLODipine (NORVASC) 10 MG tablet Take 1 tablet (10 mg total) by mouth daily.   amoxicillin (AMOXIL) 500 MG capsule Take 2,000 mg by mouth as needed (Prior to dental procedures).   atorvastatin (LIPITOR) 40 MG tablet  TAKE 1 TABLET BY MOUTH DAILY   benazepril-hydrochlorthiazide (LOTENSIN HCT) 20-12.5 MG tablet Take 1 tablet by mouth every evening.   ELIQUIS 5 MG TABS tablet TAKE 1 TABLET BY MOUTH 2 TIMES DAILY.   flecainide (TAMBOCOR) 50 MG tablet TAKE ONE TABLET BY MOUTH TWICE DAILY   glimepiride (AMARYL) 1 MG tablet Take 1 mg by mouth daily with breakfast.   Iron-Vitamins (GERITOL PO) Take by mouth as needed.   JARDIANCE 10 MG TABS tablet Take 10 mg by mouth daily.   metFORMIN (GLUCOPHAGE-XR) 500 MG 24 hr tablet Take 1,500 mg by mouth daily.   metoprolol tartrate (LOPRESSOR) 25 MG tablet ONE-HALF TABLET BY MOUTH TWICE DAILY   naphazoline-glycerin (CLEAR EYES REDNESS) 0.012-0.25 % SOLN Place 1-2 drops into both eyes 4 (four) times daily as needed for eye irritation.   OVER THE COUNTER MEDICATION Blood builder-1 capsule by mouth daily   OZEMPIC, 0.25 OR 0.5 MG/DOSE, 2 MG/3ML SOPN Inject 0.5 mg into the skin once a week.   sertraline (ZOLOFT) 50 MG tablet Take 50 mg by mouth every morning.   tamsulosin (FLOMAX) 0.4 MG CAPS capsule TAKE 2 CAPSULES BY MOUTH DAILY AFTER BREAKFAST   zinc gluconate 50 MG tablet Take 50 mg by mouth daily.    Allergies:   Patient has no known allergies.   Social History   Socioeconomic History   Marital status: Married    Spouse name: [name] [name]   Number of children: 3   Years of education: Not on file   Highest education level: Not on file  Occupational History   Occupation: Higher education careers adviser, Clinical biochemist    Comment: retired  Tobacco Use   Smoking status: Never   Smokeless tobacco: Never  Vaping Use   Vaping Use: Never used  Substance and Sexual Activity   Alcohol use: Yes    Comment: wine-occasionally Previously drank a shot of vodka in the evenings   Drug use: Never   Sexual activity: Not on file  Other Topics Concern   Not on file  Social History Narrative   Patient lives with wife.  Feels safe   Social Determinants of Radio broadcast assistant Strain: Not on file   Food Insecurity: Not on file  Transportation Needs: Not on file  Physical Activity: Not on file  Stress: Not on file  Social Connections: Not on file     Family History:  The patient's family history includes Alzheimer's disease in his mother; Colon cancer in his sister; Diabetes in his mother.  ROS:   12-point review of systems is negative unless otherwise noted in HPI.   EKGs/Labs/Other Studies Reviewed:    Studies reviewed were summarized  above. The additional studies were reviewed today:  ETT 07/04/2021:   Exercise capacity was normal. Patient exercised for 5 min and 0 sec. Maximum HR of 160 bpm. MPHR 110.0 %. Peak METS 7.0 .   No ST deviation was noted. There were no arrhythmias during stress. There were no arrhythmias during recovery. ECG was interpretable and without significant changes. The ECG was negative for ischemia.   Normal treadmill stress test with no evidence of arrhythmia. __________   Carlton Adam MPI 03/2021:   No ST deviation was noted.   Pharmacological myocardial perfusion imaging study with no significant  ischemia Normal wall motion, EF estimated at 47%, GI uptake artifact noted No EKG changes concerning for ischemia at peak stress or in recovery. Resting and recovery EKG with frequent PVCs CT attenuation correction images with mild coronary calcification, mild aortic atherosclerosis Low risk scan   Zio patch 03/2021: Patient had a min HR of 63 bpm, max HR of 121 bpm, and avg HR of 81 bpm.  Predominant underlying rhythm was Sinus Rhythm. First Degree AV Block was present. 1  run of Supraventricular Tachycardia occurred lasting 4 beats with a max rate of 96 bpm (avg 92 bpm). Rare PACs. Frequent PVCs with a burden of 9.6%. __________   2D echo 01/03/2021: 1. Left ventricular ejection fraction, by estimation, is 55 to 60%. The  left ventricle has normal function. The left ventricle has no regional  wall motion abnormalities. Left ventricular diastolic  parameters are  indeterminate.   2. Right ventricular systolic function is normal. The right ventricular  size is normal.   3. Left atrial size was mildly dilated.   4. The mitral valve is normal in structure. Trivial mitral valve  regurgitation.   5. The aortic valve is tricuspid. Aortic valve regurgitation is not  visualized.   6. The inferior vena cava is normal in size with greater than 50%  respiratory variability, suggesting right atrial pressure of 3 mmHg.   EKG:  EKG is ordered today.  The EKG ordered today demonstrates NSR, 77 bpm, first-degree AV block, no acute ST-T changes, consistent with prior tracing  Recent Labs: 08/05/2022: ALT 17; BUN 22; Creatinine, Ser 0.77; Hemoglobin 12.9; Platelets 371; Potassium 3.8; Sodium 127  Recent Lipid Panel    Component Value Date/Time   CHOL 170 12/20/2014 1238   TRIG 75 12/20/2014 1238   HDL 65 12/20/2014 1238   CHOLHDL 2.6 12/20/2014 1238   LDLCALC 90 12/20/2014 1238    PHYSICAL EXAM:    VS:  BP 130/74 (BP Location: Left Arm, Patient Position: Sitting, Cuff Size: Normal)   Pulse 77   Ht 5' 9.5" (1.765 m)   Wt 268 lb 12.8 oz (121.9 kg)   SpO2 100%   BMI 39.13 kg/m   BMI: Body mass index is 39.13 kg/m.  Physical Exam Vitals reviewed.  Constitutional:      Appearance: He is well-developed.  HENT:     Head: Normocephalic and atraumatic.  Eyes:     General:        Right eye: No discharge.        Left eye: No discharge.  Neck:     Vascular: No JVD.  Cardiovascular:     Rate and Rhythm: Normal rate and regular rhythm.     Heart sounds: Normal heart sounds, S1 normal and S2 normal. Heart sounds not distant. No midsystolic click and no opening snap. No murmur heard.    No friction rub.  Pulmonary:  Effort: Pulmonary effort is normal. No respiratory distress.     Breath sounds: Normal breath sounds. No decreased breath sounds, wheezing or rales.  Chest:     Chest wall: No tenderness.  Abdominal:     General: There  is no distension.  Musculoskeletal:     Cervical back: Normal range of motion.     Right lower leg: No edema.     Left lower leg: No edema.  Skin:    General: Skin is warm and dry.     Nails: There is no clubbing.  Neurological:     Mental Status: He is alert and oriented to person, place, and time.  Psychiatric:        Speech: Speech normal.        Behavior: Behavior normal.        Thought Content: Thought content normal.        Judgment: Judgment normal.     Wt Readings from Last 3 Encounters:  08/13/22 268 lb 12.8 oz (121.9 kg)  08/05/22 267 lb (121.1 kg)  02/08/22 271 lb (122.9 kg)     ASSESSMENT & PLAN:   Persistent A-fib: Maintaining sinus rhythm on tartrate 12.5 mg twice daily as well as flecainide 50 mg twice daily.  CHA2DS2-VASc 5 (HTN, age x 2, DM, vascular disease).  Is on apixaban 5 mg twice daily and is without symptoms of bleeding.  He does not meet reduced dosing criteria.  Recent labs showed stable hemoglobin, renal function, and electrolytes.  He does note some difficulty in cutting Lopressor tabs.  When he is running out of his current supply, he will contact our office with plans to transition him to Toprol-XL 25 mg daily at that time.  Frequent PVCs: Quiescent.  He remains on metoprolol.  HTN: Blood pressure is reasonably controlled in the office today.  He remains on amlodipine, benazepril/HCTZ, and metoprolol tartrate.  Coronary calcification/aortic atherosclerosis/HLD: No symptoms suggestive of angina.  LDL 52 in 03/2022 with normal AST/ALT earlier this month.  He remains on atorvastatin 40 mg.  Dyspnea: Obtain echo.  Recent stress test showed no evidence of ischemia.    Disposition: F/u with Dr. Fletcher Anon or an APP in 6 months.   Medication Adjustments/Labs and Tests Ordered: Current medicines are reviewed at length with the patient today.  Concerns regarding medicines are outlined above. Medication changes, Labs and Tests ordered today are summarized  above and listed in the Patient Instructions accessible in Encounters.   Signed, Christell Faith, PA-C 08/13/2022 10:55 AM     [facility] [address] [place] [address] [place], [place] [address] [phone]

## 2022-08-13 NOTE — Patient Instructions (Signed)
Medication Instructions:  Your physician recommends that you continue on your current medications as directed. Please refer to the Current Medication list given to you today.  *If you need a refill on your cardiac medications before your next appointment, please call your pharmacy*   Lab Work: No labs ordered  If you have labs (blood work) drawn today and your tests are completely normal, you will receive your results only by: [place] (if you have MyChart) OR A paper copy in the mail If you have any lab test that is abnormal or we need to change your treatment, we will call you to review the results.   Testing/Procedures: Your physician has requested that you have an echocardiogram. Echocardiography is a painless test that uses sound waves to create images of your heart. It provides your doctor with information about the size and shape of your heart and how well your heart's chambers and valves are working. This procedure takes approximately one hour. There are no restrictions for this procedure. Please do NOT wear cologne, perfume, aftershave, or lotions (deodorant is allowed). Please arrive 15 minutes prior to your appointment time.  Follow-Up: At [facility], you and your health needs are our priority.  As part of our continuing mission to provide you with exceptional heart care, we have created designated Provider Care Teams.  These Care Teams include your primary Cardiologist (physician) and Advanced Practice Providers (APPs -  Physician Assistants and Nurse Practitioners) who all work together to provide you with the care you need, when you need it.  We recommend signing up for the patient portal called "MyChart".  Sign up information is provided on this After Visit Summary.  MyChart is used to connect with patients for Virtual Visits (Telemedicine).  Patients are able to view lab/test results, encounter notes, upcoming appointments, etc.  Non-urgent messages can be  sent to your provider as well.   To learn more about what you can do with MyChart, go to [email or web address].    Your next appointment:   6 month(s)  Provider:   You may see [name], MD or one of the following Advanced Practice Providers on your designated Care Team:   Murray Hodgkins, NP Christell Faith, PA-C Cadence Kathlen Mody, PA-C Gerrie Nordmann, NP

## 2022-08-14 LAB — POCT I-STAT CREATININE: Creatinine, Ser: 0.7 mg/dL (ref 0.61–1.24)

## 2022-08-15 ENCOUNTER — Encounter: Payer: Self-pay | Admitting: Oncology

## 2022-08-20 ENCOUNTER — Other Ambulatory Visit: Payer: Self-pay | Admitting: Cardiovascular Disease

## 2022-08-20 DIAGNOSIS — I4819 Other persistent atrial fibrillation: Secondary | ICD-10-CM

## 2022-08-21 NOTE — Telephone Encounter (Signed)
Please assist patient with Eliquis refill request.  Thank you

## 2022-08-21 NOTE — Telephone Encounter (Signed)
Eliquis 37m refill request received. Patient is 77years old, weight-121.9kg, Crea-0.77 on 08/05/22, Diagnosis-Afib, and last seen by RChristell Faithon 08/13/22. Dose is appropriate based on dosing criteria. Will send in refill to requested pharmacy.

## 2022-09-17 ENCOUNTER — Other Ambulatory Visit: Payer: Self-pay | Admitting: Physician Assistant

## 2022-09-18 ENCOUNTER — Other Ambulatory Visit: Payer: Self-pay | Admitting: Physician Assistant

## 2022-09-26 ENCOUNTER — Ambulatory Visit: Payer: Medicare Other | Attending: Physician Assistant

## 2022-09-26 DIAGNOSIS — R0602 Shortness of breath: Secondary | ICD-10-CM | POA: Diagnosis not present

## 2022-09-26 DIAGNOSIS — I77810 Thoracic aortic ectasia: Secondary | ICD-10-CM

## 2022-09-26 HISTORY — DX: Thoracic aortic ectasia: I77.810

## 2022-09-26 LAB — ECHOCARDIOGRAM COMPLETE
AR max vel: 4.65 cm2
AV Area VTI: 4.47 cm2
AV Area mean vel: 4.52 cm2
AV Mean grad: 2 mmHg
AV Peak grad: 2.9 mmHg
Ao pk vel: 0.85 m/s
Area-P 1/2: 3.03 cm2
S' Lateral: 2.9 cm

## 2022-09-26 MED ORDER — PERFLUTREN LIPID MICROSPHERE
1.0000 mL | INTRAVENOUS | Status: AC | PRN
  Administered 2022-09-26: 2 mL via INTRAVENOUS

## 2022-09-27 ENCOUNTER — Ambulatory Visit: Payer: Medicare Other | Admitting: Urology

## 2022-10-03 ENCOUNTER — Ambulatory Visit: Payer: Medicare Other | Admitting: Urology

## 2022-10-03 ENCOUNTER — Encounter: Payer: Self-pay | Admitting: Urology

## 2022-10-03 VITALS — BP 121/74 | HR 74 | Ht 68.0 in | Wt 261.0 lb

## 2022-10-03 DIAGNOSIS — R3912 Poor urinary stream: Secondary | ICD-10-CM

## 2022-10-03 DIAGNOSIS — R3915 Urgency of urination: Secondary | ICD-10-CM

## 2022-10-03 DIAGNOSIS — N401 Enlarged prostate with lower urinary tract symptoms: Secondary | ICD-10-CM

## 2022-10-03 DIAGNOSIS — R350 Frequency of micturition: Secondary | ICD-10-CM

## 2022-10-03 LAB — MICROSCOPIC EXAMINATION

## 2022-10-03 LAB — URINALYSIS, COMPLETE
Bilirubin, UA: NEGATIVE
Ketones, UA: NEGATIVE
Leukocytes,UA: NEGATIVE
Nitrite, UA: NEGATIVE
Protein,UA: NEGATIVE
Specific Gravity, UA: 1.01 (ref 1.005–1.030)
Urobilinogen, Ur: 0.2 mg/dL (ref 0.2–1.0)
pH, UA: 6 (ref 5.0–7.5)

## 2022-10-03 MED ORDER — SILODOSIN 8 MG PO CAPS: 8.0000 mg | ORAL_CAPSULE | Freq: Every day | ORAL | 1 refills | Status: DC

## 2022-10-03 MED ORDER — GEMTESA 75 MG PO TABS: 75.0000 mg | ORAL_TABLET | Freq: Every day | ORAL | 0 refills | Status: DC

## 2022-10-03 NOTE — Progress Notes (Signed)
I, DeAsia L Maxie,acting as a scribe for Abbie Sons, MD.,have documented all relevant documentation on the behalf of Abbie Sons, MD,as directed by  Abbie Sons, MD while in the presence of Abbie Sons, MD.   I, Amy L Pierron,acting as a scribe for Abbie Sons, MD.,have documented all relevant documentation on the behalf of Abbie Sons, MD,as directed by  Abbie Sons, MD while in the presence of Abbie Sons, MD.  10/03/2022 12:22 PM   [name] [name] 1946/02/27 [id number]  Referring provider: Unice Bailey, MD [address], [place],  [place] [address]  Chief Complaint  Patient presents with   Benign Prostatic Hypertrophy   Urologic History: BPH with LUTS On Tamsulosin  2. Left hydrocele Underwent hydrocele aspiration 03/29/2021  HPI: 77 y.o. male presents for annual follow-up.  He had a CT abdomen/pelvis ordered by PCP January 2024 which showed a possible scrotal mass. He was seen by oncology and a scrotal ultrasound was ordered, which showed a large hydrocele and a large fat-containing left inguinal hernia extending into the superior aspect of the scrotum. No longer taking Myrbetriq. He only tried the samples and does not remember efficacy. He has been diagnosed with spinal stenosis. Does complain of frequency, urgency, and a weak urinary stream. Presently taking Tamsulosin 0.8 mg and was asking if he could titrate up to 1.2 mg. His hydrocele is not bothersome.  PMH: Past Medical History:  Diagnosis Date   Anemia    iron deficiency and b12 deficiency.  followed by oncology   Anxiety    Arthritis    Atrial fibrillation 12/28/2020   BPH (benign prostatic hyperplasia)    Diabetes mellitus    Diverticulosis    Elevated platelet count    Hemorrhoids    HOH (hard of hearing)    wears hearing aides   Hypertension    OSA on CPAP    Prostate disorder    Spinal stenosis     Surgical History: Past Surgical History:   Procedure Laterality Date   CARDIOVERSION N/A 02/19/2021   Procedure: CARDIOVERSION;  Surgeon: Wellington Hampshire, MD;  Location: [facility] OR[facility];  Service: Cardiovascular;  Laterality: N/A;   CATARACT EXTRACTION W/PHACO Left 07/14/2018   Procedure: CATARACT EXTRACTION PHACO AND INTRAOCULAR LENS PLACEMENT ([place]) LEFT, DIABETIC;  Surgeon: Birder Robson, MD;  Location: [facility] OR[facility];  Service: Ophthalmology;  Laterality: Left;  [place] 00:50 CDE 8.93 Fluid pack lot # [id number]   CATARACT EXTRACTION W/PHACO Right 09/01/2018   Procedure: CATARACT EXTRACTION PHACO AND INTRAOCULAR LENS PLACEMENT ([facility])-RIGHT, DIABETIC;  Surgeon: Birder Robson, MD;  Location: [facility] OR[facility];  Service: Ophthalmology;  Laterality: Right;  [place] 00:50.9 CDE 6.80 Fluid Pack Lot # [id number]     COLONOSCOPY     CYSTOSCOPY WITH INSERTION OF UROLIFT     CYSTOSCOPY WITH INSERTION OF UROLIFT  08/2018   ESOPHAGOGASTRODUODENOSCOPY (EGD) WITH PROPOFOL N/A 03/17/2019   Procedure: ESOPHAGOGASTRODUODENOSCOPY (EGD) WITH PROPOFOL;  Surgeon: Toledo, Benay Pike, MD;  Location: [facility];  Service: Gastroenterology;  Laterality: N/A;   EYE SURGERY Bilateral    cataract surgery   JOINT REPLACEMENT Bilateral 2014   Total knee replacement.   REPLACEMENT TOTAL KNEE Bilateral    TOTAL HIP ARTHROPLASTY Left 02/23/2020   Procedure: TOTAL HIP ARTHROPLASTY;  Surgeon: Dereck Leep, MD;  Location: [facility];  Service: Orthopedics;  Laterality: Left;   TOTAL HIP ARTHROPLASTY Right 01/10/2021   Procedure: TOTAL HIP ARTHROPLASTY;  Surgeon: Dereck Leep, MD;  Location: [facility] OR[facility];  Service: Orthopedics;  Laterality: Right;    Home Medications:  Allergies as of 10/03/2022   No Known Allergies      Medication List        Accurate as of October 03, 2022 12:22 PM. If you have any questions, ask your nurse or doctor.          STOP taking these medications    GERITOL PO Stopped by: Abbie Sons, MD   mirabegron ER 50 MG Tb24 tablet Commonly known as:  MYRBETRIQ Stopped by: Abbie Sons, MD   tamsulosin 0.4 MG Caps capsule Commonly known as: FLOMAX Stopped by: Abbie Sons, MD       TAKE these medications    acetaminophen 650 MG CR tablet Commonly known as: TYLENOL Take 1,300 mg by mouth every 8 (eight) hours as needed for pain.   ALPRAZolam 0.5 MG tablet Commonly known as: XANAX Take 1 mg by mouth at bedtime.   amLODipine 10 MG tablet Commonly known as: NORVASC Take 1 tablet (10 mg total) by mouth daily.   amoxicillin 500 MG capsule Commonly known as: AMOXIL Take 2,000 mg by mouth as needed (Prior to dental procedures).   atorvastatin 40 MG tablet Commonly known as: LIPITOR TAKE 1 TABLET BY MOUTH DAILY   benazepril-hydrochlorthiazide 20-12.5 MG tablet Commonly known as: LOTENSIN HCT Take 1 tablet by mouth every evening.   cyanocobalamin 1000 MCG tablet Commonly known as: VITAMIN B12 Take 1,000 mcg by mouth daily.   Eliquis 5 MG Tabs tablet Generic drug: apixaban TAKE 1 TABLET BY MOUTH 2 TIMES DAILY.   flecainide 50 MG tablet Commonly known as: TAMBOCOR TAKE ONE TABLET BY MOUTH TWICE DAILY   Gemtesa 75 MG Tabs Generic drug: Vibegron Take 1 tablet (75 mg total) by mouth daily. Started by: Abbie Sons, MD   glimepiride 1 MG tablet Commonly known as: AMARYL Take 1 mg by mouth daily with breakfast.   Jardiance 10 MG Tabs tablet Generic drug: empagliflozin Take 10 mg by mouth daily.   metFORMIN 500 MG 24 hr tablet Commonly known as: GLUCOPHAGE-XR Take 1,500 mg by mouth daily.   metoprolol tartrate 25 MG tablet Commonly known as: LOPRESSOR ONE-HALF TABLET BY MOUTH TWICE DAILY   naphazoline-glycerin 0.012-0.25 % Soln Commonly known as: CLEAR EYES REDNESS Place 1-2 drops into both eyes 4 (four) times daily as needed for eye irritation.   OVER THE COUNTER MEDICATION Blood builder-1 capsule by mouth daily   Ozempic (0.25 or 0.5 MG/DOSE) 2 MG/3ML Sopn Generic drug: Semaglutide(0.25 or  0.5MG /DOS) Inject 0.5 mg into the skin once a week. What changed: Another medication with the same name was removed. Continue taking this medication, and follow the directions you see here. Changed by: Abbie Sons, MD   sertraline 50 MG tablet Commonly known as: ZOLOFT Take 50 mg by mouth every morning.   silodosin 8 MG Caps capsule Commonly known as: RAPAFLO Take 1 capsule (8 mg total) by mouth daily with breakfast. Started by: Abbie Sons, MD   zinc gluconate 50 MG tablet Take 50 mg by mouth daily.        Family History: Family History  Problem Relation Age of Onset   Diabetes Mother    Alzheimer's disease Mother    Colon cancer Sister     Social History:  reports that he has never smoked. He has never used smokeless tobacco. He reports current alcohol use. He reports that he does not use drugs.  Physical Exam: BP 121/74   Pulse 74   Ht 5\' 8"  (1.727 m)   Wt 261 lb (118.4 kg)   BMI 39.68 kg/m   Constitutional:  Alert and oriented, No acute distress. HEENT: [place] AT, moist mucus membranes.  Trachea midline, no masses. Respiratory: Normal respiratory effort, no increased work of breathing. Psychiatric: Normal mood and affect.  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 Did not recommend titrating Tamsulosin to 1.2 mg due to increased fall risk. Recommend a trial of Silodosin 8 mg daily in lieu of Tamsulosin. Gemtesa samples daily x 28 days. Call back regarding efficacy. Continue annual follow up and instructed to call earlier for worsening urinary symptoms.  I have reviewed the above documentation for accuracy and completeness, and I agree with the above.   Abbie Sons, [place] [address], [place], [place] [address] [phone]

## 2022-10-04 ENCOUNTER — Telehealth: Payer: Self-pay | Admitting: *Deleted

## 2022-10-04 NOTE — Telephone Encounter (Signed)
Notified patient as instructed, patient pleased °

## 2022-10-04 NOTE — Telephone Encounter (Signed)
-----   Message from [name], MD sent at 10/03/2022  5:36 PM EDT ----- Urinalysis today did show microscopic blood in the urine.  Recent CT did not show any renal abnormalities recommend scheduling cystoscopy to make sure there are no significant abnormalities in the bladder.  Schedule in approximately 1 month and can also reassess his urinary symptoms after the addition of the silodosin and Gemtesa which were started today

## 2022-10-28 ENCOUNTER — Other Ambulatory Visit: Payer: Self-pay | Admitting: Urology

## 2022-11-05 MED ORDER — METOPROLOL SUCCINATE ER 25 MG PO TB24: 25.0000 mg | ORAL_TABLET | Freq: Every day | ORAL | 3 refills | Status: DC

## 2022-11-05 NOTE — Telephone Encounter (Signed)
Message sent to patient with instructions. Will monitor to make sure he reads that message.

## 2022-11-05 NOTE — Telephone Encounter (Signed)
-----   Message from Sondra Barges, PA-C sent at 11/05/2022  1:22 PM EDT ----- Please discontinue Lopressor. Start Toprol XL 25 mg daily. He will need a prescription sent to his pharmacy.

## 2022-11-06 ENCOUNTER — Ambulatory Visit
Admission: RE | Admit: 2022-11-06 | Discharge: 2022-11-06 | Disposition: A | Discharge status: Home / Self Care | Payer: Medicare Other | Source: Ambulatory Visit | Attending: Oncology | Admitting: Oncology

## 2022-11-06 DIAGNOSIS — R599 Enlarged lymph nodes, unspecified: Secondary | ICD-10-CM | POA: Insufficient documentation

## 2022-11-06 LAB — POCT I-STAT CREATININE: Creatinine, Ser: 0.7 mg/dL (ref 0.61–1.24)

## 2022-11-06 MED ORDER — IOHEXOL 300 MG/ML  SOLN
100.0000 mL | Freq: Once | INTRAMUSCULAR | Status: AC | PRN
  Administered 2022-11-06: 100 mL via INTRAVENOUS

## 2022-11-07 ENCOUNTER — Ambulatory Visit: Payer: Medicare Other | Admitting: Urology

## 2022-11-07 ENCOUNTER — Encounter: Payer: Self-pay | Admitting: Urology

## 2022-11-07 VITALS — BP 130/77 | HR 91 | Ht 68.0 in | Wt 258.0 lb

## 2022-11-07 DIAGNOSIS — R3129 Other microscopic hematuria: Secondary | ICD-10-CM

## 2022-11-07 DIAGNOSIS — N401 Enlarged prostate with lower urinary tract symptoms: Secondary | ICD-10-CM | POA: Diagnosis not present

## 2022-11-07 LAB — URINALYSIS, COMPLETE
Bilirubin, UA: NEGATIVE
Ketones, UA: NEGATIVE
Leukocytes,UA: NEGATIVE
Nitrite, UA: NEGATIVE
Protein,UA: NEGATIVE
Specific Gravity, UA: 1.01 (ref 1.005–1.030)
Urobilinogen, Ur: 1 mg/dL (ref 0.2–1.0)
pH, UA: 6 (ref 5.0–7.5)

## 2022-11-07 LAB — MICROSCOPIC EXAMINATION

## 2022-11-07 NOTE — Progress Notes (Signed)
   11/07/22  CC:  Chief Complaint  Patient presents with   Cysto    HPI: Refer to prior office note 10/03/22.  Urinalysis at that visit showed 3-10 RBCs.  He was switched from tamsulosin to silodosin for lower urinary tract symptoms and also given a trial of Gemtesa.  He does feel his voiding symptoms have improved on silodosin and Gemtesa other Leslye Peer is going to cost $130 a month  There were no vitals taken for this visit. NED. A&Ox3.   No respiratory distress   Abd soft, NT, ND Normal phallus with bilateral descended testicles  Cystoscopy Procedure Note  Patient identification was confirmed, informed consent was obtained, and patient was prepped using Betadine solution.  Lidocaine jelly was administered per urethral meatus.     Pre-Procedure: - Inspection reveals a normal caliber urethral meatus.  Procedure: The flexible cystoscope was introduced without difficulty - No urethral strictures/lesions are present. - Prominent lateral lobe enlargement prostate with hypervascularity - Mild elevation bladder neck - Bilateral ureteral orifices identified - Bladder mucosa  reveals no ulcers, tumors, or lesions - No bladder stones - No trabeculation  Retroflexion shows no abnormalities or intravesical median lobe   Post-Procedure: - Patient tolerated the procedure well  Assessment/ Plan: No bladder mucosal abnormalities/tumor Prominent prostate enlargement with hypervascularity which is the most likely etiology of his microhematuria He will reassess his voiding pattern once he runs out of Gemtesa samples.  Will continue silodosin We discussed outlet procedures for BPH and he states his symptoms are not bothersome enough presently that he desires surgery 73-month follow-up with PVR, IPSS and UA CT urogram for increasing hematuria   [name], MD

## 2022-11-13 ENCOUNTER — Inpatient Hospital Stay: Payer: Medicare Other | Attending: Oncology | Admitting: Oncology

## 2022-11-13 ENCOUNTER — Encounter: Payer: Self-pay | Admitting: Oncology

## 2022-11-13 VITALS — BP 143/85 | HR 94 | Temp 98.1°F | Wt 264.8 lb

## 2022-11-13 DIAGNOSIS — G8929 Other chronic pain: Secondary | ICD-10-CM | POA: Insufficient documentation

## 2022-11-13 DIAGNOSIS — R590 Localized enlarged lymph nodes: Secondary | ICD-10-CM | POA: Insufficient documentation

## 2022-11-13 DIAGNOSIS — R599 Enlarged lymph nodes, unspecified: Secondary | ICD-10-CM | POA: Diagnosis not present

## 2022-11-13 DIAGNOSIS — M25511 Pain in right shoulder: Secondary | ICD-10-CM | POA: Diagnosis not present

## 2022-11-13 DIAGNOSIS — M545 Low back pain, unspecified: Secondary | ICD-10-CM | POA: Diagnosis not present

## 2022-11-13 DIAGNOSIS — Z833 Family history of diabetes mellitus: Secondary | ICD-10-CM | POA: Insufficient documentation

## 2022-11-13 DIAGNOSIS — G4733 Obstructive sleep apnea (adult) (pediatric): Secondary | ICD-10-CM | POA: Diagnosis not present

## 2022-11-13 DIAGNOSIS — Z800 Family history of malignant neoplasm of digestive organs: Secondary | ICD-10-CM | POA: Insufficient documentation

## 2022-11-13 DIAGNOSIS — I4891 Unspecified atrial fibrillation: Secondary | ICD-10-CM | POA: Insufficient documentation

## 2022-11-13 DIAGNOSIS — Z79899 Other long term (current) drug therapy: Secondary | ICD-10-CM | POA: Insufficient documentation

## 2022-11-13 DIAGNOSIS — Z8719 Personal history of other diseases of the digestive system: Secondary | ICD-10-CM | POA: Insufficient documentation

## 2022-11-13 DIAGNOSIS — N5089 Other specified disorders of the male genital organs: Secondary | ICD-10-CM | POA: Diagnosis present

## 2022-11-13 DIAGNOSIS — F419 Anxiety disorder, unspecified: Secondary | ICD-10-CM | POA: Insufficient documentation

## 2022-11-13 DIAGNOSIS — E119 Type 2 diabetes mellitus without complications: Secondary | ICD-10-CM | POA: Insufficient documentation

## 2022-11-13 DIAGNOSIS — Z818 Family history of other mental and behavioral disorders: Secondary | ICD-10-CM | POA: Insufficient documentation

## 2022-11-13 DIAGNOSIS — Z7901 Long term (current) use of anticoagulants: Secondary | ICD-10-CM | POA: Insufficient documentation

## 2022-11-13 NOTE — Progress Notes (Signed)
Hematology/Oncology Consult note [facility]  Telephone:([phone] [phone]  Patient Care Team: Lurena Nida, MD as PCP - General (Rheumatology) Iran Ouch, MD as PCP - Cardiology (Cardiology) Ernest Pine, Illene Labrador, MD (Orthopedic Surgery) Exie Parody, MD (Hematology and Oncology)   Name of the patient: [name] [name]  [id number]  [birth year]   Date of visit: 11/13/22  Diagnosis- intra-abdominal adenopathy   Chief complaint/ Reason for visit-discuss CT scan results and further management  Heme/Onc history: Patient is a 76 year old male who has seen me in the past for iron deficiency. More recently patient underwent MRI lumbar spine in January 2024 for symptoms of low back pain which showed evidence of spinal stenosis but incidentally showed a prominent periaortic lymph node measuring 1.5 cm. This was followed by a CT abdomen and pelvis with contrast which showed bilateral adrenal adenomas. He was also noted to have external iliac lymphadenopathy measuring 2.4 x 1.9 cm and a common iliac lymph node measuring 3.3 x 1.5 cm. Masslike area identified in the scrotum of on the left side measuring 6.6 x 6.1 cm. 11 mm cystic lesion in the uncinate process of pancreas for which MRI was recommended in 2 years.   Interval history-patient has chronic back pain due to spinal stenosis.  Also reports right shoulder pain.  He is followingWith urology for urinary complaints as well.  ECOG PS- 1 Pain scale- 0   Review of systems- Review of Systems  Constitutional:  Negative for chills, fever, malaise/fatigue and weight loss.  HENT:  Negative for congestion, ear discharge and nosebleeds.   Eyes:  Negative for blurred vision.  Respiratory:  Negative for cough, hemoptysis, sputum production, shortness of breath and wheezing.   Cardiovascular:  Negative for chest pain, palpitations, orthopnea and claudication.  Gastrointestinal:  Negative for abdominal pain, blood  in stool, constipation, diarrhea, heartburn, melena, nausea and vomiting.  Genitourinary:  Negative for dysuria, flank pain, frequency, hematuria and urgency.  Musculoskeletal:  Negative for back pain, joint pain and myalgias.  Skin:  Negative for rash.  Neurological:  Negative for dizziness, tingling, focal weakness, seizures, weakness and headaches.  Endo/Heme/Allergies:  Does not bruise/bleed easily.  Psychiatric/Behavioral:  Negative for depression and suicidal ideas. The patient does not have insomnia.       No Known Allergies   Past Medical History:  Diagnosis Date   Anemia    iron deficiency and b12 deficiency.  followed by oncology   Anxiety    Arthritis    Atrial fibrillation (HCC) 12/28/2020   BPH (benign prostatic hyperplasia)    Diabetes mellitus    Diverticulosis    Elevated platelet count    Hemorrhoids    HOH (hard of hearing)    wears hearing aides   Hypertension    OSA on CPAP    Prostate disorder    Spinal stenosis      Past Surgical History:  Procedure Laterality Date   CARDIOVERSION N/A 02/19/2021   Procedure: CARDIOVERSION;  Surgeon: Iran Ouch, MD;  Location: [facility];  Service: Cardiovascular;  Laterality: N/A;   CATARACT EXTRACTION W/PHACO Left 07/14/2018   Procedure: CATARACT EXTRACTION PHACO AND INTRAOCULAR LENS PLACEMENT (IOC) LEFT, DIABETIC;  Surgeon: Galen Manila, MD;  Location: [facility];  Service: Ophthalmology;  Laterality: Left;  [place] 00:50 CDE 8.93 Fluid pack lot # [id number]   CATARACT EXTRACTION W/PHACO Right 09/01/2018   Procedure: CATARACT EXTRACTION PHACO AND INTRAOCULAR LENS PLACEMENT (IOC)-RIGHT, DIABETIC;  Surgeon: Galen Manila, MD;  Location: [facility];  Service: Ophthalmology;  Laterality: Right;  [place] 00:50.9 CDE 6.80 Fluid Pack Lot # [id number]     COLONOSCOPY     CYSTOSCOPY WITH INSERTION OF UROLIFT     CYSTOSCOPY WITH INSERTION OF UROLIFT  08/2018   ESOPHAGOGASTRODUODENOSCOPY (EGD) WITH PROPOFOL N/A 03/17/2019    Procedure: ESOPHAGOGASTRODUODENOSCOPY (EGD) WITH PROPOFOL;  Surgeon: Toledo, Boykin Nearing, MD;  Location: [facility];  Service: Gastroenterology;  Laterality: N/A;   EYE SURGERY Bilateral    cataract surgery   JOINT REPLACEMENT Bilateral 2014   Total knee replacement.   REPLACEMENT TOTAL KNEE Bilateral    TOTAL HIP ARTHROPLASTY Left 02/23/2020   Procedure: TOTAL HIP ARTHROPLASTY;  Surgeon: Donato Heinz, MD;  Location: [facility];  Service: Orthopedics;  Laterality: Left;   TOTAL HIP ARTHROPLASTY Right 01/10/2021   Procedure: TOTAL HIP ARTHROPLASTY;  Surgeon: Donato Heinz, MD;  Location: [facility];  Service: Orthopedics;  Laterality: Right;    Social History   Socioeconomic History   Marital status: Married    Spouse name: Myriam Jacobson   Number of children: 3   Years of education: Not on file   Highest education level: Not on file  Occupational History   Occupation: Nurse, adult, Personnel officer    Comment: retired  Tobacco Use   Smoking status: Never   Smokeless tobacco: Never  Vaping Use   Vaping Use: Never used  Substance and Sexual Activity   Alcohol use: Yes    Comment: wine-occasionally Previously drank a shot of vodka in the evenings   Drug use: Never   Sexual activity: Not on file  Other Topics Concern   Not on file  Social History Narrative   Patient lives with wife.  Feels safe   Social Determinants of Corporate investment banker Strain: Not on file  Food Insecurity: Not on file  Transportation Needs: Not on file  Physical Activity: Not on file  Stress: Not on file  Social Connections: Not on file  Intimate Partner Violence: Not on file    Family History  Problem Relation Age of Onset   Diabetes Mother    Alzheimer's disease Mother    Colon cancer Sister      Current Outpatient Medications:    acetaminophen (TYLENOL) 650 MG CR tablet, Take 1,300 mg by mouth every 8 (eight) hours as needed for pain., Disp: , Rfl:    ALPRAZolam (XANAX) 0.5 MG tablet, Take 1 mg by  mouth at bedtime., Disp: , Rfl:    amLODipine (NORVASC) 5 MG tablet, Take 1 tablet by mouth 1 day or 1 dose., Disp: , Rfl:    amoxicillin (AMOXIL) 500 MG capsule, Take 2,000 mg by mouth as needed (Prior to dental procedures)., Disp: , Rfl:    atorvastatin (LIPITOR) 40 MG tablet, TAKE 1 TABLET BY MOUTH DAILY, Disp: 90 tablet, Rfl: 3   benazepril-hydrochlorthiazide (LOTENSIN HCT) 20-12.5 MG tablet, Take 1 tablet by mouth every evening., Disp: , Rfl:    ELIQUIS 5 MG TABS tablet, TAKE 1 TABLET BY MOUTH 2 TIMES DAILY., Disp: 60 tablet, Rfl: 5   flecainide (TAMBOCOR) 50 MG tablet, TAKE ONE TABLET BY MOUTH TWICE DAILY, Disp: 180 tablet, Rfl: 0   glimepiride (AMARYL) 1 MG tablet, Take 1 mg by mouth daily with breakfast., Disp: , Rfl:    JARDIANCE 10 MG TABS tablet, Take 10 mg by mouth daily., Disp: , Rfl:    metFORMIN (GLUCOPHAGE-XR) 500 MG 24 hr tablet, Take 1,500 mg by mouth daily., Disp: , Rfl:  metoprolol succinate (TOPROL XL) 25 MG 24 hr tablet, Take 1 tablet (25 mg total) by mouth daily., Disp: 90 tablet, Rfl: 3   metoprolol tartrate (LOPRESSOR) 25 MG tablet, ONE-HALF TABLET BY MOUTH TWICE DAILY, Disp: 90 tablet, Rfl: 0   naphazoline-glycerin (CLEAR EYES REDNESS) 0.012-0.25 % SOLN, Place 1-2 drops into both eyes 4 (four) times daily as needed for eye irritation., Disp: , Rfl:    OVER THE COUNTER MEDICATION, Blood builder-1 capsule by mouth daily, Disp: , Rfl:    OZEMPIC, 0.25 OR 0.5 MG/DOSE, 2 MG/3ML SOPN, Inject 0.5 mg into the skin once a week., Disp: , Rfl:    sertraline (ZOLOFT) 50 MG tablet, Take 50 mg by mouth every morning., Disp: , Rfl:    silodosin (RAPAFLO) 8 MG CAPS capsule, TAKE 1 CAPSULE BY MOUTH DAILY WITH BREAKFAST., Disp: 30 capsule, Rfl: 1   Vibegron (GEMTESA) 75 MG TABS, Take 1 tablet (75 mg total) by mouth daily., Disp: 28 tablet, Rfl: 0   vitamin B-12 (CYANOCOBALAMIN) 1000 MCG tablet, Take 1,000 mcg by mouth daily., Disp: , Rfl:    zinc gluconate 50 MG tablet, Take 50 mg by  mouth daily., Disp: , Rfl:   Physical exam:  Vitals:   11/13/22 1514  BP: (!) 143/85  Pulse: 94  Temp: 98.1 F (36.7 C)  TempSrc: Tympanic  SpO2: 98%  Weight: 264 lb 12.8 oz (120.1 kg)   Physical Exam Cardiovascular:     Rate and Rhythm: Normal rate and regular rhythm.     Heart sounds: Normal heart sounds.  Pulmonary:     Effort: Pulmonary effort is normal.     Breath sounds: Normal breath sounds.  Abdominal:     General: Bowel sounds are normal.     Palpations: Abdomen is soft.  Skin:    General: Skin is warm and dry.  Neurological:     Mental Status: He is alert and oriented to person, place, and time.         Latest Ref Rng & Units 11/06/2022    8:03 AM  CMP  Creatinine 0.61 - 1.24 mg/dL 1.61       Latest Ref Rng & Units 08/05/2022    3:21 PM  CBC  WBC 4.0 - 10.5 K/uL 11.4   Hemoglobin 13.0 - 17.0 g/dL 09.6   Hematocrit 04.5 - 52.0 % 37.8   Platelets 150 - 400 K/uL 371     No images are attached to the encounter.  CT Abdomen Pelvis W Contrast  Result Date: 11/09/2022 CLINICAL DATA:  Groin lymphadenopathy. EXAM: CT ABDOMEN AND PELVIS WITH CONTRAST TECHNIQUE: Multidetector CT imaging of the abdomen and pelvis was performed using the standard protocol following bolus administration of intravenous contrast. RADIATION DOSE REDUCTION: This exam was performed according to the departmental dose-optimization program which includes automated exposure control, adjustment of the mA and/or kV according to patient size and/or use of iterative reconstruction technique. CONTRAST:  OMNIPAQUE IOHEXOL 300 MG/ML  SOLN COMPARISON:  CT 07/31/2022. FINDINGS: Lower chest: Lung bases are clear. No pleural effusion. Coronary artery calcifications are seen. Hepatobiliary: No space-occupying liver lesion. Patent portal vein. Gallbladder is nondilated. Stone in the gallbladder. Pancreas: Stable small cystic lesion in the uncinate process of the pancreas. Spleen: Normal in size without  focal abnormality. Adrenals/urinary tract: No enhancing renal mass. Stable exophytic left-sided simple appearing Bosniak 1 renal cyst measuring 10.2 cm. Hounsfield unit of 3. Bosniak 1 lesion. No specific imaging follow-up. Bilateral adrenal nodules are stable. The right  is consistent with an adenoma based on Hounsfield unit on delayed imaging in the left also had previous relative washout of an adenoma as today. Again however there is a smaller lesion along the inferior aspect of the left adrenal gland which is more indeterminate measuring 10 mm on series 2, image 31. The ureters have normal course and caliber down to the bladder. Preserved contours of the urinary bladder. Stomach/bowel: On this non oral contrast exam, large bowel has a normal course and caliber with diffuse colonic stool. Diffuse colonic diverticulosis. Normal appendix. Visualized small bowel is nondilated. Stomach is nondilated. Vascular/Lymphatic: Normal caliber aorta and IVC with mild vascular calcifications. Prominent left common iliac artery chain node again seen which previously measured 3.3 x 1.5 cm and today 3.2 by 1.3 cm, similar. There also some prominent nodes along the left external iliac chain. Previously measuring 2.4 x 1.9 cm and today on series 2, image 72 2.6 by 1.8 cm. Similar. No new lymph node enlargement identified in the abdomen and pelvis. Reproductive: Enlarged prostate with some speckled calcification. Left-sided scrotal hydrocele seen at the very edge of the imaging field. Other: Moderate fat containing left inguinal hernia. No free air or free fluid. Anasarca. Musculoskeletal: Significant streak artifact from the bilateral hip arthroplasties obscuring portions of the pelvis. There are scattered degenerative changes of the spine and pelvis. Curvature of the spine as well. IMPRESSION: Stable enlarged left iliac chain lymph nodes. No new lymph node enlargement. Recommend continued surveillance. Left inguinal hernia with  scrotal hydroceles at the edge of the imaging field. Please correlate with prior scrotal ultrasound of 08/07/2022. Bilateral adrenal adenomas.  Benign left-sided renal cysts. Separate inferior margin left adrenal nodule not clearly an adenoma on this examination. Please correlate with any remote prior dedicated workup when appropriate Colonic diverticulosis. Gallstone Stable small cystic lesion in the uncinate process of the pancreas. Recommend follow up in 2 years. Enlarged prostate. Electronically Signed   By: Karen Kays M.D.   On: 11/09/2022 16:43     Assessment and plan- Patient is a 77 y.o. male with intra-abdominal adenopathy here to Discuss CT scan results and further management  I have reviewed CT abdomen pelvis images independently and discussed findings with the patient.  He was known to have common iliac chain adenopathy which has remained stable over the last 4 months.  He is to lymph nodes of concern 1 which measured 3.2 cm previously and now 3.2 cm.  Another lymph node which previously measured 2.4 is now 1.8 cm.  Overall this appears to be stable.  Cystic lesion in the uncinate process of the pancreas also stable.  I plan to repeat his CT abdomen and pelvis with contrast in 6 months and see him thereafter.  Although these lymph nodes are abnormal the location of the lymph nodes makes it difficult to biopsy.   Visit Diagnosis 1. Adenopathy      Dr. Owens Shark, MD, MPH [facility] [phone] 11/13/2022 4:22 PM

## 2022-11-14 ENCOUNTER — Other Ambulatory Visit: Payer: Self-pay | Admitting: *Deleted

## 2022-11-14 DIAGNOSIS — R599 Enlarged lymph nodes, unspecified: Secondary | ICD-10-CM

## 2022-11-19 ENCOUNTER — Other Ambulatory Visit: Payer: Self-pay | Admitting: *Deleted

## 2022-11-19 ENCOUNTER — Encounter: Payer: Self-pay | Admitting: Urology

## 2022-11-19 MED ORDER — GEMTESA 75 MG PO TABS: 75.0000 mg | ORAL_TABLET | Freq: Every day | ORAL | 11 refills | Status: DC

## 2022-12-11 ENCOUNTER — Other Ambulatory Visit: Payer: Self-pay | Admitting: Cardiovascular Disease

## 2023-01-24 ENCOUNTER — Other Ambulatory Visit: Payer: Self-pay | Admitting: Urology

## 2023-02-21 ENCOUNTER — Other Ambulatory Visit: Payer: Self-pay | Admitting: Cardiovascular Disease

## 2023-02-21 DIAGNOSIS — I4819 Other persistent atrial fibrillation: Secondary | ICD-10-CM

## 2023-02-21 NOTE — Telephone Encounter (Signed)
Refill request

## 2023-02-21 NOTE — Telephone Encounter (Signed)
Prescription refill request for Eliquis received. Indication: Afib Last office visit: 08/13/22 ([place])  Scr: 0.7 (12/20/22)  Age: 77 Weight: 120.1kg  Appropriate dose. Refill sent.

## 2023-03-25 ENCOUNTER — Other Ambulatory Visit: Payer: Self-pay | Admitting: Urology

## 2023-04-19 ENCOUNTER — Other Ambulatory Visit: Payer: Self-pay | Admitting: Physician Assistant

## 2023-04-21 NOTE — Telephone Encounter (Signed)
last visit on 08/13/22 with plan to f/u in 6 months, please schedule.  Thanks!

## 2023-04-23 ENCOUNTER — Other Ambulatory Visit: Payer: Self-pay | Admitting: Urology

## 2023-04-25 NOTE — Telephone Encounter (Signed)
Patient is scheduled for 11/20

## 2023-05-07 ENCOUNTER — Encounter: Payer: Self-pay | Admitting: Urology

## 2023-05-07 ENCOUNTER — Ambulatory Visit: Payer: Medicare Other | Admitting: Urology

## 2023-05-07 VITALS — BP 114/78 | HR 90 | Ht 69.0 in | Wt 256.0 lb

## 2023-05-07 DIAGNOSIS — N401 Enlarged prostate with lower urinary tract symptoms: Secondary | ICD-10-CM

## 2023-05-07 DIAGNOSIS — R3129 Other microscopic hematuria: Secondary | ICD-10-CM | POA: Diagnosis not present

## 2023-05-07 LAB — URINALYSIS, COMPLETE
Bilirubin, UA: NEGATIVE
Ketones, UA: NEGATIVE
Leukocytes,UA: NEGATIVE
Nitrite, UA: NEGATIVE
Protein,UA: NEGATIVE
Specific Gravity, UA: 1.015 (ref 1.005–1.030)
Urobilinogen, Ur: 0.2 mg/dL (ref 0.2–1.0)
pH, UA: 6 (ref 5.0–7.5)

## 2023-05-07 LAB — MICROSCOPIC EXAMINATION: Bacteria, UA: NONE SEEN

## 2023-05-07 LAB — BLADDER SCAN AMB NON-IMAGING: Scan Result: 223

## 2023-05-07 NOTE — Progress Notes (Signed)
I, [name] [name], acting as a scribe for [name] Altes, MD., have documented all relevant documentation on the behalf of [name] Altes, MD, as directed by [name] Altes, MD while in the presence of [name] Altes, MD.  05/07/2023 12:56 PM   [name] [name] 04-10-1946 [id number]  Referring provider: Lurena Nida, MD [address], [address] [place],  [place] [address]  Chief Complaint  Patient presents with   Benign Prostatic Hypertrophy   Urologic history 1. BPH with LUTS   2. Microhematuria CT abdomen and pelvis, no uptrack abnormalities.  Cystoscopy 10/2022 lateral lobe enlargement/hypervascularity Prostate volume calculated on CT approximately 120 cc.  HPI: [name] [name] is a 77 y.o. male presents for 6 month follow-up.   Presently on silodosin and Gemtesa and is currently satisfied with his voiding pattern.  He will occasionally have "spells" where his symptoms are worse but overall he is doing well.  Denies dysuria, gross hematuria.  Denies flank, abdominal pain.   PMH: Past Medical History:  Diagnosis Date   Anemia    iron deficiency and b12 deficiency.  followed by oncology   Anxiety    Arthritis    Atrial fibrillation (HCC) 12/28/2020   BPH (benign prostatic hyperplasia)    Diabetes mellitus    Diverticulosis    Elevated platelet count    Hemorrhoids    HOH (hard of hearing)    wears hearing aides   Hypertension    OSA on CPAP    Prostate disorder    Spinal stenosis     Surgical History: Past Surgical History:  Procedure Laterality Date   CARDIOVERSION N/A 02/19/2021   Procedure: CARDIOVERSION;  Surgeon: Iran Ouch, MD;  Location: [facility];  Service: Cardiovascular;  Laterality: N/A;   CATARACT EXTRACTION W/PHACO Left 07/14/2018   Procedure: CATARACT EXTRACTION PHACO AND INTRAOCULAR LENS PLACEMENT (IOC) LEFT, DIABETIC;  Surgeon: Galen Manila, MD;  Location: [facility];  Service: Ophthalmology;  Laterality: Left;  [place] 00:50 CDE  8.93 Fluid pack lot # [id number]   CATARACT EXTRACTION W/PHACO Right 09/01/2018   Procedure: CATARACT EXTRACTION PHACO AND INTRAOCULAR LENS PLACEMENT (IOC)-RIGHT, DIABETIC;  Surgeon: Galen Manila, MD;  Location: [facility];  Service: Ophthalmology;  Laterality: Right;  [place] 00:50.9 CDE 6.80 Fluid Pack Lot # [id number]     COLONOSCOPY     CYSTOSCOPY WITH INSERTION OF UROLIFT     CYSTOSCOPY WITH INSERTION OF UROLIFT  08/2018   ESOPHAGOGASTRODUODENOSCOPY (EGD) WITH PROPOFOL N/A 03/17/2019   Procedure: ESOPHAGOGASTRODUODENOSCOPY (EGD) WITH PROPOFOL;  Surgeon: Toledo, Boykin Nearing, MD;  Location: [facility];  Service: Gastroenterology;  Laterality: N/A;   EYE SURGERY Bilateral    cataract surgery   JOINT REPLACEMENT Bilateral 2014   Total knee replacement.   REPLACEMENT TOTAL KNEE Bilateral    TOTAL HIP ARTHROPLASTY Left 02/23/2020   Procedure: TOTAL HIP ARTHROPLASTY;  Surgeon: Donato Heinz, MD;  Location: [facility];  Service: Orthopedics;  Laterality: Left;   TOTAL HIP ARTHROPLASTY Right 01/10/2021   Procedure: TOTAL HIP ARTHROPLASTY;  Surgeon: Donato Heinz, MD;  Location: [facility];  Service: Orthopedics;  Laterality: Right;    Home Medications:  Allergies as of 05/07/2023   No Known Allergies      Medication List        Accurate as of May 07, 2023 12:56 PM. If you have any questions, ask your nurse or doctor.          acetaminophen 650 MG CR tablet Commonly known  as: TYLENOL Take 1,300 mg by mouth every 8 (eight) hours as needed for pain.   ALPRAZolam 0.5 MG tablet Commonly known as: XANAX Take 1 mg by mouth at bedtime.   amLODipine 5 MG tablet Commonly known as: NORVASC Take 1 tablet by mouth 1 day or 1 dose.   amoxicillin 500 MG capsule Commonly known as: AMOXIL Take 2,000 mg by mouth as needed (Prior to dental procedures).   atorvastatin 40 MG tablet Commonly known as: LIPITOR Take 1 tablet (40 mg total) by mouth daily. Due for follow up visit.  Please call  to schedule appointment prior to next refill   benazepril-hydrochlorthiazide 20-12.5 MG tablet Commonly known as: LOTENSIN HCT Take 1 tablet by mouth every evening.   cyanocobalamin 1000 MCG tablet Commonly known as: VITAMIN B12 Take 1,000 mcg by mouth daily.   Eliquis 5 MG Tabs tablet Generic drug: apixaban TAKE 1 TABLET BY MOUTH 2 TIMES DAILY.   flecainide 50 MG tablet Commonly known as: TAMBOCOR TAKE ONE TABLET BY MOUTH TWICE DAILY   Gemtesa 75 MG Tabs Generic drug: Vibegron Take 1 tablet (75 mg total) by mouth daily.   glimepiride 1 MG tablet Commonly known as: AMARYL Take 1 mg by mouth daily with breakfast.   Jardiance 10 MG Tabs tablet Generic drug: empagliflozin Take 10 mg by mouth daily.   metFORMIN 500 MG 24 hr tablet Commonly known as: GLUCOPHAGE-XR Take 1,500 mg by mouth daily.   metoprolol succinate 25 MG 24 hr tablet Commonly known as: Toprol XL Take 1 tablet (25 mg total) by mouth daily.   metoprolol tartrate 25 MG tablet Commonly known as: LOPRESSOR ONE-HALF TABLET BY MOUTH TWICE DAILY   naphazoline-glycerin 0.012-0.25 % Soln Commonly known as: CLEAR EYES REDNESS Place 1-2 drops into both eyes 4 (four) times daily as needed for eye irritation.   OVER THE COUNTER MEDICATION Blood builder-1 capsule by mouth daily   Ozempic (0.25 or 0.5 MG/DOSE) 2 MG/3ML Sopn Generic drug: Semaglutide(0.25 or 0.5MG /DOS) Inject 0.5 mg into the skin once a week.   sertraline 50 MG tablet Commonly known as: ZOLOFT Take 50 mg by mouth every morning.   silodosin 8 MG Caps capsule Commonly known as: RAPAFLO TAKE 1 CAPSULE BY MOUTH ONCE DAILY WITH BREAKFAST   zinc gluconate 50 MG tablet Take 50 mg by mouth daily.        Allergies: No Known Allergies  Family History: Family History  Problem Relation Age of Onset   Diabetes Mother    Alzheimer's disease Mother    Colon cancer Sister     Social History:  reports that he has never smoked. He has never  used smokeless tobacco. He reports current alcohol use. He reports that he does not use drugs.   Physical Exam: BP 114/78   Pulse 90   Ht 5\' 9"  (1.753 m)   Wt 256 lb (116.1 kg)   BMI 37.80 kg/m   Constitutional:  Alert, No acute distress. HEENT: [place] AT Respiratory: Normal respiratory effort, no increased work of breathing. Psychiatric: Normal mood and affect.  Urinalysis  Dipstick 1+ blood/2+ glucose, microscopy 3-10 RBC  Assessment & Plan:    1. BPH with LUTS Stable on silodosin/Gemtesa.  PVR today 223 mL  2. Microhematuria UA today stable 3-10 RBC 1 year follow-up with P[facility]   I have reviewed the above documentation for accuracy and completeness, and I agree with the above.   [name] Altes, MD  [facility] [address]  [address] [place], [place] [address] [phone]

## 2023-05-12 ENCOUNTER — Encounter: Payer: Self-pay | Admitting: Oncology

## 2023-05-14 ENCOUNTER — Ambulatory Visit
Admission: RE | Admit: 2023-05-14 | Discharge: 2023-05-14 | Disposition: A | Discharge status: Home / Self Care | Payer: Medicare Other | Source: Ambulatory Visit | Attending: Oncology | Admitting: Oncology

## 2023-05-14 DIAGNOSIS — R599 Enlarged lymph nodes, unspecified: Secondary | ICD-10-CM | POA: Insufficient documentation

## 2023-05-14 MED ORDER — IOHEXOL 300 MG/ML  SOLN
100.0000 mL | Freq: Once | INTRAMUSCULAR | Status: AC | PRN
  Administered 2023-05-14: 100 mL via INTRAVENOUS

## 2023-05-16 ENCOUNTER — Encounter: Payer: Self-pay | Admitting: Oncology

## 2023-05-16 ENCOUNTER — Inpatient Hospital Stay: Payer: Medicare Other | Attending: Oncology | Admitting: Oncology

## 2023-05-16 VITALS — BP 131/79 | HR 88 | Temp 96.1°F | Resp 17 | Wt 264.1 lb

## 2023-05-16 DIAGNOSIS — M47817 Spondylosis without myelopathy or radiculopathy, lumbosacral region: Secondary | ICD-10-CM | POA: Diagnosis not present

## 2023-05-16 DIAGNOSIS — R599 Enlarged lymph nodes, unspecified: Secondary | ICD-10-CM | POA: Diagnosis not present

## 2023-05-16 DIAGNOSIS — Z7901 Long term (current) use of anticoagulants: Secondary | ICD-10-CM | POA: Insufficient documentation

## 2023-05-16 DIAGNOSIS — G4733 Obstructive sleep apnea (adult) (pediatric): Secondary | ICD-10-CM | POA: Diagnosis not present

## 2023-05-16 DIAGNOSIS — I2510 Atherosclerotic heart disease of native coronary artery without angina pectoris: Secondary | ICD-10-CM | POA: Diagnosis not present

## 2023-05-16 DIAGNOSIS — Z8719 Personal history of other diseases of the digestive system: Secondary | ICD-10-CM | POA: Insufficient documentation

## 2023-05-16 DIAGNOSIS — Z818 Family history of other mental and behavioral disorders: Secondary | ICD-10-CM | POA: Diagnosis not present

## 2023-05-16 DIAGNOSIS — K5730 Diverticulosis of large intestine without perforation or abscess without bleeding: Secondary | ICD-10-CM | POA: Diagnosis not present

## 2023-05-16 DIAGNOSIS — R590 Localized enlarged lymph nodes: Secondary | ICD-10-CM | POA: Insufficient documentation

## 2023-05-16 DIAGNOSIS — F419 Anxiety disorder, unspecified: Secondary | ICD-10-CM | POA: Diagnosis not present

## 2023-05-16 DIAGNOSIS — Z833 Family history of diabetes mellitus: Secondary | ICD-10-CM | POA: Diagnosis not present

## 2023-05-16 DIAGNOSIS — M2550 Pain in unspecified joint: Secondary | ICD-10-CM | POA: Insufficient documentation

## 2023-05-16 DIAGNOSIS — N400 Enlarged prostate without lower urinary tract symptoms: Secondary | ICD-10-CM | POA: Diagnosis not present

## 2023-05-16 DIAGNOSIS — K8020 Calculus of gallbladder without cholecystitis without obstruction: Secondary | ICD-10-CM | POA: Insufficient documentation

## 2023-05-16 DIAGNOSIS — I700 Atherosclerosis of aorta: Secondary | ICD-10-CM | POA: Insufficient documentation

## 2023-05-16 DIAGNOSIS — Z79899 Other long term (current) drug therapy: Secondary | ICD-10-CM | POA: Insufficient documentation

## 2023-05-16 DIAGNOSIS — Z800 Family history of malignant neoplasm of digestive organs: Secondary | ICD-10-CM | POA: Insufficient documentation

## 2023-05-16 DIAGNOSIS — K4090 Unilateral inguinal hernia, without obstruction or gangrene, not specified as recurrent: Secondary | ICD-10-CM | POA: Diagnosis not present

## 2023-05-16 DIAGNOSIS — I4891 Unspecified atrial fibrillation: Secondary | ICD-10-CM | POA: Diagnosis not present

## 2023-05-17 ENCOUNTER — Encounter: Payer: Self-pay | Admitting: Oncology

## 2023-05-17 NOTE — Progress Notes (Signed)
Hematology/Oncology Consult note [facility]  Telephone:([phone] [phone]  Patient Care Team: Lurena Nida, MD as PCP - General (Rheumatology) Iran Ouch, MD as PCP - Cardiology (Cardiology) Ernest Pine, Illene Labrador, MD (Orthopedic Surgery) Exie Parody, MD (Hematology and Oncology)   Name of the patient: [name] [name]  [id number]  12-Nov-1945   Date of visit: 05/17/23  Diagnosis- intrabadominal adenopathy likely benign  Chief complaint/ Reason for visit- discuss ct scan results and further management  Heme/Onc history:  Patient is a 77 year old male who has seen me in the past for iron deficiency. More recently patient underwent MRI lumbar spine in January 2024 for symptoms of low back pain which showed evidence of spinal stenosis but incidentally showed a prominent periaortic lymph node measuring 1.5 cm. This was followed by a CT abdomen and pelvis with contrast which showed bilateral adrenal adenomas. He was also noted to have external iliac lymphadenopathy measuring 2.4 x 1.9 cm and a common iliac lymph node measuring 3.3 x 1.5 cm. Masslike area identified in the scrotum of on the left side measuring 6.6 x 6.1 cm. 11 mm cystic lesion in the uncinate process of pancreas for which MRI was recommended in 2 years.     Interval history- patient will be undergoing right reverse shoulder surgery soon. Denies any abdominal pain   ECOG PS- 1 Pain scale- 4   Review of systems- Review of Systems  Constitutional:  Negative for chills, fever, malaise/fatigue and weight loss.  HENT:  Negative for congestion, ear discharge and nosebleeds.   Eyes:  Negative for blurred vision.  Respiratory:  Negative for cough, hemoptysis, sputum production, shortness of breath and wheezing.   Cardiovascular:  Negative for chest pain, palpitations, orthopnea and claudication.  Gastrointestinal:  Negative for abdominal pain, blood in stool, constipation, diarrhea,  heartburn, melena, nausea and vomiting.  Genitourinary:  Negative for dysuria, flank pain, frequency, hematuria and urgency.  Musculoskeletal:  Positive for joint pain. Negative for back pain and myalgias.  Skin:  Negative for rash.  Neurological:  Negative for dizziness, tingling, focal weakness, seizures, weakness and headaches.  Endo/Heme/Allergies:  Does not bruise/bleed easily.  Psychiatric/Behavioral:  Negative for depression and suicidal ideas. The patient does not have insomnia.       No Known Allergies   Past Medical History:  Diagnosis Date   Anemia    iron deficiency and b12 deficiency.  followed by oncology   Anxiety    Arthritis    Atrial fibrillation (HCC) 12/28/2020   BPH (benign prostatic hyperplasia)    Diabetes mellitus    Diverticulosis    Elevated platelet count    Hemorrhoids    HOH (hard of hearing)    wears hearing aides   Hypertension    OSA on CPAP    Prostate disorder    Spinal stenosis      Past Surgical History:  Procedure Laterality Date   CARDIOVERSION N/A 02/19/2021   Procedure: CARDIOVERSION;  Surgeon: Iran Ouch, MD;  Location: [facility];  Service: Cardiovascular;  Laterality: N/A;   CATARACT EXTRACTION W/PHACO Left 07/14/2018   Procedure: CATARACT EXTRACTION PHACO AND INTRAOCULAR LENS PLACEMENT (IOC) LEFT, DIABETIC;  Surgeon: Galen Manila, MD;  Location: [facility];  Service: Ophthalmology;  Laterality: Left;  [place] 00:50 CDE 8.93 Fluid pack lot # [id number]   CATARACT EXTRACTION W/PHACO Right 09/01/2018   Procedure: CATARACT EXTRACTION PHACO AND INTRAOCULAR LENS PLACEMENT (IOC)-RIGHT, DIABETIC;  Surgeon: Galen Manila, MD;  Location: [facility];  Service: Ophthalmology;  Laterality: Right;  [place] 00:50.9 CDE 6.80 Fluid Pack Lot # [id number]     COLONOSCOPY     CYSTOSCOPY WITH INSERTION OF UROLIFT     CYSTOSCOPY WITH INSERTION OF UROLIFT  08/2018   ESOPHAGOGASTRODUODENOSCOPY (EGD) WITH PROPOFOL N/A 03/17/2019   Procedure:  ESOPHAGOGASTRODUODENOSCOPY (EGD) WITH PROPOFOL;  Surgeon: Toledo, Boykin Nearing, MD;  Location: [facility];  Service: Gastroenterology;  Laterality: N/A;   EYE SURGERY Bilateral    cataract surgery   JOINT REPLACEMENT Bilateral 2014   Total knee replacement.   REPLACEMENT TOTAL KNEE Bilateral    TOTAL HIP ARTHROPLASTY Left 02/23/2020   Procedure: TOTAL HIP ARTHROPLASTY;  Surgeon: Donato Heinz, MD;  Location: [facility];  Service: Orthopedics;  Laterality: Left;   TOTAL HIP ARTHROPLASTY Right 01/10/2021   Procedure: TOTAL HIP ARTHROPLASTY;  Surgeon: Donato Heinz, MD;  Location: [facility];  Service: Orthopedics;  Laterality: Right;    Social History   Socioeconomic History   Marital status: Married    Spouse name: Myriam Jacobson   Number of children: 3   Years of education: Not on file   Highest education level: Not on file  Occupational History   Occupation: Nurse, adult, Personnel officer    Comment: retired  Tobacco Use   Smoking status: Never   Smokeless tobacco: Never  Vaping Use   Vaping status: Never Used  Substance and Sexual Activity   Alcohol use: Yes    Comment: wine-occasionally Previously drank a shot of vodka in the evenings   Drug use: Never   Sexual activity: Not on file  Other Topics Concern   Not on file  Social History Narrative   Patient lives with wife.  Feels safe   Social Determinants of Health   Financial Resource Strain: Low Risk  (08/26/2022)   Received from [facility], [facility]   Overall Financial Resource Strain (CARDIA)    Difficulty of Paying Living Expenses: Not hard at all  Food Insecurity: No Food Insecurity (08/26/2022)   Received from [facility], [facility]   Hunger Vital Sign    Worried About Running Out of Food in the Last Year: Never true    Ran Out of Food in the Last Year: Never true  Transportation Needs: No Transportation Needs (08/26/2022)   Received from [facility], [facility]   [facility] - Transportation    In the past 12 months, has lack of transportation kept you from medical appointments or from getting medications?: No    Lack of Transportation (Non-Medical): No  Physical Activity: Not on file  Stress: Not on file  Social Connections: Not on file  Intimate Partner Violence: Not on file    Family History  Problem Relation Age of Onset   Diabetes Mother    Alzheimer's disease Mother    Colon cancer Sister      Current Outpatient Medications:    acetaminophen (TYLENOL) 650 MG CR tablet, Take 1,300 mg by mouth every 8 (eight) hours as needed for pain., Disp: , Rfl:    ALPRAZolam (XANAX) 0.5 MG tablet, Take 1 mg by mouth at bedtime., Disp: , Rfl:    amLODipine (NORVASC) 5 MG tablet, Take 1 tablet by mouth 1 day or 1 dose., Disp: , Rfl:    amoxicillin (AMOXIL) 500 MG capsule, Take 2,000 mg by mouth as needed (Prior to dental procedures)., Disp: , Rfl:    atorvastatin (LIPITOR) 40 MG tablet, Take  1 tablet (40 mg total) by mouth daily. Due for follow up visit.  Please call to schedule appointment prior to next refill, Disp: 90 tablet, Rfl: 0   benazepril-hydrochlorthiazide (LOTENSIN HCT) 20-12.5 MG tablet, Take 1 tablet by mouth every evening., Disp: , Rfl:    ELIQUIS 5 MG TABS tablet, TAKE 1 TABLET BY MOUTH 2 TIMES DAILY., Disp: 60 tablet, Rfl: 5   flecainide (TAMBOCOR) 50 MG tablet, TAKE ONE TABLET BY MOUTH TWICE DAILY, Disp: 180 tablet, Rfl: 0   glimepiride (AMARYL) 1 MG tablet, Take 1 mg by mouth daily with breakfast., Disp: , Rfl:    JARDIANCE 10 MG TABS tablet, Take 10 mg by mouth daily., Disp: , Rfl:    metFORMIN (GLUCOPHAGE-XR) 500 MG 24 hr tablet, Take 1,500 mg by mouth daily., Disp: , Rfl:    metoprolol succinate (TOPROL XL) 25 MG 24 hr tablet, Take 1 tablet (25 mg total) by mouth daily., Disp: 90 tablet, Rfl: 3   metoprolol tartrate (LOPRESSOR) 25 MG tablet, ONE-HALF TABLET BY MOUTH TWICE DAILY,  Disp: 90 tablet, Rfl: 0   naphazoline-glycerin (CLEAR EYES REDNESS) 0.012-0.25 % SOLN, Place 1-2 drops into both eyes 4 (four) times daily as needed for eye irritation., Disp: , Rfl:    OVER THE COUNTER MEDICATION, Blood builder-1 capsule by mouth daily, Disp: , Rfl:    OZEMPIC, 0.25 OR 0.5 MG/DOSE, 2 MG/3ML SOPN, Inject 0.5 mg into the skin once a week., Disp: , Rfl:    sertraline (ZOLOFT) 50 MG tablet, Take 50 mg by mouth every morning., Disp: , Rfl:    silodosin (RAPAFLO) 8 MG CAPS capsule, TAKE 1 CAPSULE BY MOUTH ONCE DAILY WITH BREAKFAST, Disp: 30 capsule, Rfl: 1   Vibegron (GEMTESA) 75 MG TABS, Take 1 tablet (75 mg total) by mouth daily., Disp: 30 tablet, Rfl: 11   vitamin B-12 (CYANOCOBALAMIN) 1000 MCG tablet, Take 1,000 mcg by mouth daily., Disp: , Rfl:    zinc gluconate 50 MG tablet, Take 50 mg by mouth daily., Disp: , Rfl:   Physical exam:  Vitals:   05/16/23 1329  BP: 131/79  Pulse: 88  Resp: 17  Temp: (!) 96.1 F (35.6 C)  TempSrc: Tympanic  SpO2: 97%  Weight: 264 lb 1.6 oz (119.8 kg)   Physical Exam Cardiovascular:     Rate and Rhythm: Normal rate and regular rhythm.     Heart sounds: Normal heart sounds.  Pulmonary:     Effort: Pulmonary effort is normal.  Skin:    General: Skin is warm and dry.  Neurological:     Mental Status: He is alert and oriented to person, place, and time.         Latest Ref Rng & Units 11/06/2022    8:03 AM  CMP  Creatinine 0.61 - 1.24 mg/dL 1.61       Latest Ref Rng & Units 08/05/2022    3:21 PM  CBC  WBC 4.0 - 10.5 K/uL 11.4   Hemoglobin 13.0 - 17.0 g/dL 09.6   Hematocrit 04.5 - 52.0 % 37.8   Platelets 150 - 400 K/uL 371     No images are attached to the encounter.  CT ABDOMEN PELVIS W CONTRAST  Result Date: 05/16/2023 CLINICAL DATA:  Follow-up of abdominal adenopathy. EXAM: CT ABDOMEN AND PELVIS WITH CONTRAST TECHNIQUE: Multidetector CT imaging of the abdomen and pelvis was performed using the standard protocol following  bolus administration of intravenous contrast. RADIATION DOSE REDUCTION: This exam was performed according to the departmental  dose-optimization program which includes automated exposure control, adjustment of the mA and/or kV according to patient size and/or use of iterative reconstruction technique. CONTRAST:  OMNIPAQUE IOHEXOL 300 MG/ML  SOLN COMPARISON:  11/06/2022 and 07/31/2022. FINDINGS: Lower chest: Clear lung bases. Normal heart size without pericardial or pleural effusion. Multivessel coronary artery atherosclerosis. Hepatobiliary: Normal liver. Noncalcified 12 mm gallstone without acute cholecystitis or biliary duct dilatation. Pancreas: Normal, without mass or ductal dilatation. Spleen: Normal in size, without focal abnormality. Adrenals/Urinary Tract: Similar left-greater-than-right adrenal nodularity, previously evaluated as adenomas. Example on the left at up to 3.8 cm. The indeterminate inferior left adrenal nodule is unchanged at 11 mm, strongly favoring an adenoma. No specific follow-up indicated. Lower pole left renal dominant cyst of 8.0 cm. Normal right kidney, without hydronephrosis. Degraded evaluation of the pelvis, secondary to beam hardening artifact from bilateral hip arthroplasty. Grossly normal urinary bladder. Stomach/Bowel: Normal stomach, without wall thickening. Possible mild rectal wall thickening including on 88/2. Scattered colonic diverticula. Lipoma within the transverse colon at 3.0 cm on 46/2. Normal terminal ileum and appendix. Normal small bowel. Vascular/Lymphatic: Aortic atherosclerosis. No abdominal adenopathy. Index left external iliac node measures 1.6 x 2.3 cm on 86/2 versus 1.8 x 2.6 cm on the prior exam. Left common iliac node measures 3.0 x 1.3 cm on 69/2 versus 3.2 x 1.3 cm on the prior Reproductive: Poorly evaluated secondary to beam hardening artifact. Probable mild prostatomegaly. Other: No significant free fluid. Small to moderate fat containing left  inguinal hernia. Musculoskeletal: Bilateral hip arthroplasty. Lumbosacral spondylosis. IMPRESSION: 1. Similar to minimal decrease in left pelvic sidewall adenopathy. Relative stability back to 07/31/2022 favors a benign etiology. Depending on clinical concern, 1 further follow-up at 6 months with pelvic CT could be performed. 2.  No acute process in the abdomen or pelvis. 3. Degraded evaluation of the pelvis, secondary to beam hardening artifact from bilateral hip arthroplasty 4. Rectal wall thickening is relatively mild and felt to be similar on prior exams. Correlate with colon cancer screening history and if not up-to-date, consider colonoscopy or sigmoidoscopy. 5. Incidental findings, including: Cholelithiasis. Aortic Atherosclerosis (ICD10-I70.0). Electronically Signed   By: Jeronimo Greaves M.D.   On: 05/16/2023 14:05     Assessment and plan- Patient is a 77 y.o. male with h/o intra abdominal adenopathy here to discuss further management  I have reviewed CT abdomen pelvis images independently and discussed findings patient.  Official CT scan results were not available at the time of my visit.  Patient noted to have intra-abdominal adenopathy at least since January 2024 with lymph nodes ranging between 2 to 3 cm in size.  No other areas of adenopathy and these were not easily amenable to biopsy.  CT scan does not show any increase in the size of these lymph nodes.  Given no change over the last 11 months I am inclined to get 1 more CT scan in 6 months and if that is also stable he does not require any further follow-up   Visit Diagnosis 1. Adenopathy      Dr. Owens Shark, MD, MPH [facility] [phone] 05/17/2023 2:44 PM

## 2023-05-20 ENCOUNTER — Encounter: Payer: Self-pay | Admitting: Oncology

## 2023-05-20 NOTE — Progress Notes (Unsigned)
Cardiology Office Note    Date:  05/21/2023   ID:  [name] [name], DOB 09/08/1945, MRN [id number]  PCP:  Lurena Nida, MD  Cardiologist:  Lorine Bears, MD  Electrophysiologist:  None   Chief Complaint: Follow up  History of Present Illness:   [name] [name] is a 77 y.o. male with history of coronary artery calcification and aortic atherosclerosis, persistent A. Fib status post DCCV on 02/19/2021, PVCs, DM2, HTN, HLD, iron deficiency anemia requiring IV iron, osteoarthritis, diverticulosis, OSA on CPAP, BPH, and obesity who presents for follow-up of coronary calcification, A-fib, and PVCs.   He was seen as a new patient by Dr. Kirke Corin on 01/02/2021 for preoperative cardiovascular risk stratification before total hip arthroplasty given preop EKG 12/28/2020 showed newly diagnosed A. fib.  At his visit with our office in 12/2020, he was not aware of any prior history of arrhythmia.  He had no previously known cardiac history.  He denied any symptoms of angina or decompensation.  He remained in A. fib with ventricular rates in the 90s bpm.  Given his CHA2DS2-VASc of 4, initiation of Eliquis was recommended following hip surgery.  He was not requiring rate controlling medications.  Echo on 01/03/2021 demonstrated an EF of 55 to 60%, no regional wall motion abnormalities, indeterminate LV diastolic function parameters, normal RV systolic function and ventricular cavity size, mildly dilated left atrium, trivial mitral regurgitation, and an estimated right atrial pressure of 3 mmHg.  He was seen in follow-up on 02/08/2021 and was doing well from a cardiac perspective.  He remained in A. fib with controlled ventricular response.  He was started on carvedilol 3.125 mg twice daily.  He had been adherent to anticoagulation.  He subsequently underwent successful DCCV on 02/19/2021.  He was seen in the office on 03/16/2021 and was maintaining sinus rhythm with frequent PVCs noted in a pattern of ventricular trigeminy.   Carvedilol was titrated to 6.25 mg twice daily.  Subsequent 3-day Zio patch showed a predominant rhythm of sinus with an average rate of 81 bpm (range 63 to 121 bpm), first-degree AV block, 1 run of SVT lasting 4 beats, rare PACs, and frequent PVCs with a burden of 9.6%.  Given underlying first-degree AV block, further titration of beta-blocker was deferred.  For exertional shortness of breath and fatigue he underwent Lexiscan MPI on 04/13/2021 which showed no significant ischemia with normal wall motion, EF estimated at 47% with GI uptake artifact noted (echo in 12/2020 demonstrated preserved LVSF), resting and recovery EKGs with frequent PVCs.  The study was overall low risk.  CT attenuated corrected images did show a mild coronary artery calcification and mild aortic atherosclerosis.  Given ectopy noted on EKG tracings during stress test, carvedilol was discontinued and he was started on Lopressor 25 mg twice daily.  In follow-up he continued to have frequent PVCs and first-degree AV block leading metoprolol to be decreased and flecainide initiated with subsequent ETT stable.   He was last seen in the office in 08/2022 and was without symptoms of angina or cardiac decompensation.  He did feel like he was a little more short of breath when compared to prior visits, however he was uncertain if this was just a stress weighing on him as he was undergoing evaluation for incidentally found enlarged lymph nodes and scrotal mass.  Subsequent echo in 08/2022 showed an EF of 60 to 65%, no regional wall motion abnormalities, grade 1 diastolic dysfunction, normal RV systolic function and ventricular cavity  size, mildly dilated left atrium, no significant valvular abnormalities, and borderline dilatation of the aortic root and ascending aorta measuring 39 and 36 mm respectively.  He comes in doing well from a cardiac perspective and is without symptoms of angina or cardiac decompensation.  No palpitations, dizziness,  presyncope, or syncope.  No falls or symptoms concerning for hematochezia or melena.  He does note 2 episodes of mild epistaxis (previously had frequent nosebleeds younger in life).  Main limiting factors at this time continue to be lumbar spinal stenosis and right shoulder pain.  He is planning to undergo right shoulder surgery.  Remains active at baseline.   Labs independently reviewed: 04/2023 - potassium 4.4, BUN 17, serum creatinine 0.7, A1c 7.9, albumin 3.9, AST/ALT normal, TC 134, TG 118, HDL 46, LDL 64, Hgb 13.3, PLT 487 09/979 - TSH normal   Past Medical History:  Diagnosis Date   Anemia    iron deficiency and b12 deficiency.  followed by oncology   Anxiety    Arthritis    Atrial fibrillation (HCC) 12/28/2020   BPH (benign prostatic hyperplasia)    Diabetes mellitus    Diverticulosis    Elevated platelet count    Hemorrhoids    HOH (hard of hearing)    wears hearing aides   Hypertension    OSA on CPAP    Prostate disorder    Spinal stenosis     Past Surgical History:  Procedure Laterality Date   CARDIOVERSION N/A 02/19/2021   Procedure: CARDIOVERSION;  Surgeon: Iran Ouch, MD;  Location: [facility] ORS;  Service: Cardiovascular;  Laterality: N/A;   CATARACT EXTRACTION W/PHACO Left 07/14/2018   Procedure: CATARACT EXTRACTION PHACO AND INTRAOCULAR LENS PLACEMENT (IOC) LEFT, DIABETIC;  Surgeon: Galen Manila, MD;  Location: [facility];  Service: Ophthalmology;  Laterality: Left;  [place] 00:50 CDE 8.93 Fluid pack lot # [id number]   CATARACT EXTRACTION W/PHACO Right 09/01/2018   Procedure: CATARACT EXTRACTION PHACO AND INTRAOCULAR LENS PLACEMENT (IOC)-RIGHT, DIABETIC;  Surgeon: Galen Manila, MD;  Location: [facility];  Service: Ophthalmology;  Laterality: Right;  [place] 00:50.9 CDE 6.80 Fluid Pack Lot # [id number]     COLONOSCOPY     CYSTOSCOPY WITH INSERTION OF UROLIFT     CYSTOSCOPY WITH INSERTION OF UROLIFT  08/2018   ESOPHAGOGASTRODUODENOSCOPY (EGD) WITH PROPOFOL N/A  03/17/2019   Procedure: ESOPHAGOGASTRODUODENOSCOPY (EGD) WITH PROPOFOL;  Surgeon: Toledo, Boykin Nearing, MD;  Location: [facility];  Service: Gastroenterology;  Laterality: N/A;   EYE SURGERY Bilateral    cataract surgery   JOINT REPLACEMENT Bilateral 2014   Total knee replacement.   REPLACEMENT TOTAL KNEE Bilateral    TOTAL HIP ARTHROPLASTY Left 02/23/2020   Procedure: TOTAL HIP ARTHROPLASTY;  Surgeon: Donato Heinz, MD;  Location: [facility];  Service: Orthopedics;  Laterality: Left;   TOTAL HIP ARTHROPLASTY Right 01/10/2021   Procedure: TOTAL HIP ARTHROPLASTY;  Surgeon: Donato Heinz, MD;  Location: [facility];  Service: Orthopedics;  Laterality: Right;    Current Medications: Current Meds  Medication Sig   acetaminophen (TYLENOL) 650 MG CR tablet Take 1,300 mg by mouth every 8 (eight) hours as needed for pain.   ALPRAZolam (XANAX) 0.5 MG tablet Take 1 mg by mouth at bedtime.   amLODipine (NORVASC) 5 MG tablet Take 1 tablet by mouth 1 day or 1 dose.   amoxicillin (AMOXIL) 500 MG capsule Take 2,000 mg by mouth as needed (Prior to dental procedures).   atorvastatin (LIPITOR) 40 MG tablet Take 1 tablet (40 mg total)  by mouth daily. Due for follow up visit.  Please call to schedule appointment prior to next refill   benazepril-hydrochlorthiazide (LOTENSIN HCT) 20-12.5 MG tablet Take 1 tablet by mouth every evening.   ELIQUIS 5 MG TABS tablet TAKE 1 TABLET BY MOUTH 2 TIMES DAILY.   flecainide (TAMBOCOR) 50 MG tablet TAKE ONE TABLET BY MOUTH TWICE DAILY   glimepiride (AMARYL) 1 MG tablet Take 1 mg by mouth daily with breakfast.   JARDIANCE 10 MG TABS tablet Take 10 mg by mouth daily.   metFORMIN (GLUCOPHAGE-XR) 500 MG 24 hr tablet Take 1,500 mg by mouth daily.   metoprolol succinate (TOPROL XL) 25 MG 24 hr tablet Take 1 tablet (25 mg total) by mouth daily.   naphazoline-glycerin (CLEAR EYES REDNESS) 0.012-0.25 % SOLN Place 1-2 drops into both eyes 4 (four) times daily as needed for eye  irritation.   OVER THE COUNTER MEDICATION Blood builder-1 capsule by mouth daily   OZEMPIC, 0.25 OR 0.5 MG/DOSE, 2 MG/3ML SOPN Inject 0.5 mg into the skin once a week.   sertraline (ZOLOFT) 50 MG tablet Take 50 mg by mouth every morning.   silodosin (RAPAFLO) 8 MG CAPS capsule TAKE 1 CAPSULE BY MOUTH ONCE DAILY WITH BREAKFAST   Vibegron (GEMTESA) 75 MG TABS Take 1 tablet (75 mg total) by mouth daily.   vitamin B-12 (CYANOCOBALAMIN) 1000 MCG tablet Take 1,000 mcg by mouth daily.   zinc gluconate 50 MG tablet Take 50 mg by mouth daily.   [DISCONTINUED] metoprolol tartrate (LOPRESSOR) 25 MG tablet ONE-HALF TABLET BY MOUTH TWICE DAILY    Allergies:   Patient has no known allergies.   Social History   Socioeconomic History   Marital status: Married    Spouse name: Myriam Jacobson   Number of children: 3   Years of education: Not on file   Highest education level: Not on file  Occupational History   Occupation: Nurse, adult, Personnel officer    Comment: retired  Tobacco Use   Smoking status: Never   Smokeless tobacco: Never  Vaping Use   Vaping status: Never Used  Substance and Sexual Activity   Alcohol use: Yes    Comment: wine-occasionally Previously drank a shot of vodka in the evenings   Drug use: Never   Sexual activity: Not on file  Other Topics Concern   Not on file  Social History Narrative   Patient lives with wife.  Feels safe   Social Determinants of Health   Financial Resource Strain: Low Risk  (08/26/2022)   Received from [facility], [facility]   Overall Financial Resource Strain (CARDIA)    Difficulty of Paying Living Expenses: Not hard at all  Food Insecurity: No Food Insecurity (08/26/2022)   Received from [facility], [facility]   Hunger Vital Sign    Worried About Running Out of Food in the Last Year: Never true    Ran Out of Food in the Last Year: Never true  Transportation Needs: No Transportation  Needs (08/26/2022)   Received from [facility], [facility]   [facility] - Transportation    In the past 12 months, has lack of transportation kept you from medical appointments or from getting medications?: No    Lack of Transportation (Non-Medical): No  Physical Activity: Not on file  Stress: Not on file  Social Connections: Not on file     Family History:  The patient's family history includes Alzheimer's disease in  his mother; Colon cancer in his sister; Diabetes in his mother.  ROS:   12-point review of systems is negative unless otherwise noted in the HPI.   EKGs/Labs/Other Studies Reviewed:    Studies reviewed were summarized above. The additional studies were reviewed today:  2D echo 09/26/2022: 1. Left ventricular ejection fraction, by estimation, is 60 to 65%. The  left ventricle has normal function. The left ventricle has no regional  wall motion abnormalities. Left ventricular diastolic parameters are  consistent with Grade I diastolic  dysfunction (impaired relaxation).   2. Right ventricular systolic function is normal. The right ventricular  size is normal. Tricuspid regurgitation signal is inadequate for assessing  PA pressure.   3. Left atrial size was mildly dilated.   4. The mitral valve is normal in structure. No evidence of mitral valve  regurgitation. No evidence of mitral stenosis.   5. The aortic valve is normal in structure. Aortic valve regurgitation is  not visualized. No aortic stenosis is present.   6. There is borderline dilatation of the aortic root, measuring 39 mm.  There is borderline dilatation of the ascending aorta, measuring 36 mm.   7. The inferior vena cava is normal in size with greater than 50%  respiratory variability, suggesting right atrial pressure of 3 mmHg.   8. Rhythm is normal sinus  __________  ETT 07/04/2021:   Exercise capacity was normal. Patient exercised for 5 min and 0 sec. Maximum HR of  160 bpm. MPHR 110.0 %. Peak METS 7.0 .   No ST deviation was noted. There were no arrhythmias during stress. There were no arrhythmias during recovery. ECG was interpretable and without significant changes. The ECG was negative for ischemia.   Normal treadmill stress test with no evidence of arrhythmia. __________   Eugenie Birks MPI 03/2021:   No ST deviation was noted.   Pharmacological myocardial perfusion imaging study with no significant  ischemia Normal wall motion, EF estimated at 47%, GI uptake artifact noted No EKG changes concerning for ischemia at peak stress or in recovery. Resting and recovery EKG with frequent PVCs CT attenuation correction images with mild coronary calcification, mild aortic atherosclerosis Low risk scan   Zio patch 03/2021: Patient had a min HR of 63 bpm, max HR of 121 bpm, and avg HR of 81 bpm.  Predominant underlying rhythm was Sinus Rhythm. First Degree AV Block was present. 1  run of Supraventricular Tachycardia occurred lasting 4 beats with a max rate of 96 bpm (avg 92 bpm). Rare PACs. Frequent PVCs with a burden of 9.6%. __________   2D echo 01/03/2021: 1. Left ventricular ejection fraction, by estimation, is 55 to 60%. The  left ventricle has normal function. The left ventricle has no regional  wall motion abnormalities. Left ventricular diastolic parameters are  indeterminate.   2. Right ventricular systolic function is normal. The right ventricular  size is normal.   3. Left atrial size was mildly dilated.   4. The mitral valve is normal in structure. Trivial mitral valve  regurgitation.   5. The aortic valve is tricuspid. Aortic valve regurgitation is not  visualized.   6. The inferior vena cava is normal in size with greater than 50%  respiratory variability, suggesting right atrial pressure of 3 mmHg.   EKG:  EKG is ordered today.  The EKG ordered today demonstrates NSR, 85 bpm, 1st degree AV block, no acute ST-T changes, consistent with  prior tracing  Recent Labs: 08/05/2022: ALT 17; BUN 22; Hemoglobin  12.9; Platelets 371; Potassium 3.8; Sodium 127 11/06/2022: Creatinine, Ser 0.70  Recent Lipid Panel    Component Value Date/Time   CHOL 170 12/20/2014 1238   TRIG 75 12/20/2014 1238   HDL 65 12/20/2014 1238   CHOLHDL 2.6 12/20/2014 1238   LDLCALC 90 12/20/2014 1238    PHYSICAL EXAM:    VS:  BP 114/60 (BP Location: Left Arm, Patient Position: Sitting, Cuff Size: Normal)   Pulse 85   Ht 5' 9.5" (1.765 m)   Wt 259 lb (117.5 kg)   SpO2 98%   BMI 37.70 kg/m   BMI: Body mass index is 37.7 kg/m.  Physical Exam Vitals reviewed.  Constitutional:      Appearance: He is well-developed.  HENT:     Head: Normocephalic and atraumatic.  Eyes:     General:        Right eye: No discharge.        Left eye: No discharge.  Neck:     Vascular: No JVD.  Cardiovascular:     Rate and Rhythm: Normal rate and regular rhythm.     Heart sounds: Normal heart sounds, S1 normal and S2 normal. Heart sounds not distant. No midsystolic click and no opening snap. No murmur heard.    No friction rub.  Pulmonary:     Effort: Pulmonary effort is normal. No respiratory distress.     Breath sounds: Normal breath sounds. No decreased breath sounds, wheezing, rhonchi or rales.  Chest:     Chest wall: No tenderness.  Abdominal:     General: There is no distension.  Musculoskeletal:     Cervical back: Normal range of motion.     Right lower leg: No edema.     Left lower leg: No edema.  Skin:    General: Skin is warm and dry.     Nails: There is no clubbing.  Neurological:     Mental Status: He is alert and oriented to person, place, and time.  Psychiatric:        Speech: Speech normal.        Behavior: Behavior normal.        Thought Content: Thought content normal.        Judgment: Judgment normal.     Wt Readings from Last 3 Encounters:  05/21/23 259 lb (117.5 kg)  05/16/23 264 lb 1.6 oz (119.8 kg)  05/07/23 256 lb (116.1 kg)      ASSESSMENT & PLAN:   Persistent A-fib: Maintaining sinus rhythm with Toprol XL 25 mg daily and flecainide 50 mg twice daily.  CHA2DS2-VASc 5 (HTN, age x 2, DM, vascular disease).  Remains on apixaban 5 mg twice daily and does not meet reduced dosing criteria.  No falls or symptoms concerning for hematochezia or melena.  He does have a history of nosebleeds, though not in many years until recently when he had 2 mild nosebleeds.  If bleeds return recommend ENT evaluation.  Frequent PVCs: Quiescent.  He remains on metoprolol.  HTN: Blood pressure is well-controlled in the office today on amlodipine 5 mg, benazepril/HCTZ 20/12.5 mg, and metoprolol 25 mg daily.  Coronary calcification/aortic atherosclerosis/HLD: No symptoms suggestive of angina or cardiac decompensation.  LDL 64 in 04/2023 with normal AST/ALT at that time.  Remains on apixaban in place of aspirin given history of A-fib.  Continue atorvastatin 40 mg.  Borderline dilatation of the aortic root and ascending aorta: Follow-up echo at next visit.   Disposition: F/u with Dr. Kirke Corin or an  APP in 6 months.   Medication Adjustments/Labs and Tests Ordered: Current medicines are reviewed at length with the patient today.  Concerns regarding medicines are outlined above. Medication changes, Labs and Tests ordered today are summarized above and listed in the Patient Instructions accessible in Encounters.   Signed, Eula Listen, PA-C 05/21/2023 9:01 AM     [facility] [place] [address] [place], [place] [address] [phone]

## 2023-05-21 ENCOUNTER — Encounter: Payer: Self-pay | Admitting: Physician Assistant

## 2023-05-21 ENCOUNTER — Other Ambulatory Visit: Payer: Self-pay | Admitting: Internal Medicine

## 2023-05-21 ENCOUNTER — Ambulatory Visit: Payer: Medicare Other | Attending: Physician Assistant | Admitting: Physician Assistant

## 2023-05-21 VITALS — BP 114/60 | HR 85 | Ht 69.5 in | Wt 259.0 lb

## 2023-05-21 DIAGNOSIS — I10 Essential (primary) hypertension: Secondary | ICD-10-CM | POA: Diagnosis not present

## 2023-05-21 DIAGNOSIS — I493 Ventricular premature depolarization: Secondary | ICD-10-CM

## 2023-05-21 DIAGNOSIS — E785 Hyperlipidemia, unspecified: Secondary | ICD-10-CM

## 2023-05-21 DIAGNOSIS — I4819 Other persistent atrial fibrillation: Secondary | ICD-10-CM

## 2023-05-21 DIAGNOSIS — I2510 Atherosclerotic heart disease of native coronary artery without angina pectoris: Secondary | ICD-10-CM | POA: Diagnosis not present

## 2023-05-21 DIAGNOSIS — R591 Generalized enlarged lymph nodes: Secondary | ICD-10-CM

## 2023-05-21 DIAGNOSIS — I700 Atherosclerosis of aorta: Secondary | ICD-10-CM

## 2023-05-21 NOTE — Patient Instructions (Addendum)
Medication Instructions:  Your Physician recommend you continue on your current medication as directed.    *If you need a refill on your cardiac medications before your next appointment, please call your pharmacy*  Lab Work: None Ordered  Follow-Up: At [facility], you and your health needs are our priority.  As part of our continuing mission to provide you with exceptional heart care, we have created designated Provider Care Teams.  These Care Teams include your primary Cardiologist (physician) and Advanced Practice Providers (APPs -  Physician Assistants and Nurse Practitioners) who all work together to provide you with the care you need, when you need it.  We recommend signing up for the patient portal called "MyChart".  Sign up information is provided on this After Visit Summary.  MyChart is used to connect with patients for Virtual Visits (Telemedicine).  Patients are able to view lab/test results, encounter notes, upcoming appointments, etc.  Non-urgent messages can be sent to your provider as well.   To learn more about what you can do with MyChart, go to [email or web address].    Your next appointment:   6 month(s)  Provider:   You may see Lorine Bears, MD or one of the following Advanced Practice Providers on your designated Care Team:   Eula Listen, [place]

## 2023-05-26 ENCOUNTER — Other Ambulatory Visit: Payer: Self-pay | Admitting: *Deleted

## 2023-05-26 ENCOUNTER — Telehealth: Payer: Self-pay | Admitting: *Deleted

## 2023-05-26 DIAGNOSIS — R599 Enlarged lymph nodes, unspecified: Secondary | ICD-10-CM

## 2023-05-26 NOTE — Telephone Encounter (Signed)
-----   Message from Creig Hines sent at 05/21/2023  8:33 AM EST ----- Ct abdomen pelvis with contrast in 6 months and see me 2 weeks later

## 2023-05-26 NOTE — Telephone Encounter (Signed)
Follow-up of abdominal adenopathy. Per Dr. Smith Robert. She wants him to have 6 months for ct scan abdomen, and pelvis. 2 weeks after scan to see MD. Pt has my chart and will get the appts  through my chart and it will be in May 2025. He is agreeable with the plan

## 2023-05-27 ENCOUNTER — Other Ambulatory Visit: Payer: Self-pay | Admitting: Surgery

## 2023-05-27 DIAGNOSIS — M7581 Other shoulder lesions, right shoulder: Secondary | ICD-10-CM

## 2023-05-27 DIAGNOSIS — M19011 Primary osteoarthritis, right shoulder: Secondary | ICD-10-CM

## 2023-06-06 ENCOUNTER — Ambulatory Visit
Admission: RE | Admit: 2023-06-06 | Discharge: 2023-06-06 | Disposition: A | Discharge status: Home / Self Care | Payer: Medicare Other | Source: Ambulatory Visit | Attending: Surgery

## 2023-06-06 DIAGNOSIS — M7581 Other shoulder lesions, right shoulder: Secondary | ICD-10-CM | POA: Diagnosis present

## 2023-06-06 DIAGNOSIS — M19011 Primary osteoarthritis, right shoulder: Secondary | ICD-10-CM | POA: Insufficient documentation

## 2023-06-06 MED ORDER — IOHEXOL 300 MG/ML  SOLN: 75.0000 mL | Freq: Once | INTRAMUSCULAR | Status: DC | PRN

## 2023-06-24 ENCOUNTER — Other Ambulatory Visit: Payer: Self-pay | Admitting: Urology

## 2023-06-27 ENCOUNTER — Other Ambulatory Visit: Payer: Self-pay | Admitting: Cardiovascular Disease

## 2023-07-03 ENCOUNTER — Encounter: Payer: Self-pay | Admitting: Oncology

## 2023-07-21 ENCOUNTER — Telehealth: Payer: Self-pay | Admitting: *Deleted

## 2023-07-21 NOTE — Telephone Encounter (Signed)
-----   Message from Verlee Monte sent at 07/18/2023 10:46 PM EST ----- Regarding: Request for pre-operative cardiac clearance Request for pre-operative cardiac clearance:  1. What type of surgery is being performed?  REVERSE SHOULDER ARTHROPLASTY  2. When is this surgery scheduled?  07/31/2023  3. Type of clearance being requested (medical, pharmacy, both)? BOTH   4. Are there any medications that need to be held prior to surgery? APIXABAN  5. Practice name and name of physician performing surgery?  Performing surgeon: Dr. Leron Croak, MD Requesting clearance: Quentin Mulling, FNP-C    6. Anesthesia type (none, local, MAC, general)? GENERAL  7. What is the office phone and fax number?   Phone: [phone] Fax: [phone]  ATTENTION: Unable to create telephone message as per your standard workflow. Directed by [facility] providers to send requests for cardiac clearance to this pool for appropriate distribution to provider covering pre-operative clearances.   Quentin Mulling, MSN, APRN, FNP-C, CEN [facility]  Peri-operative Services Nurse Practitioner Phone: [phone] 07/18/23 10:46 PM

## 2023-07-22 ENCOUNTER — Other Ambulatory Visit: Payer: Self-pay | Admitting: Surgery

## 2023-07-22 ENCOUNTER — Telehealth: Payer: Self-pay | Admitting: *Deleted

## 2023-07-22 ENCOUNTER — Ambulatory Visit: Payer: Medicare Other | Attending: Nurse Practitioner | Admitting: Emergency Medicine

## 2023-07-22 DIAGNOSIS — Z01810 Encounter for preprocedural cardiovascular examination: Secondary | ICD-10-CM

## 2023-07-22 NOTE — Progress Notes (Addendum)
Virtual Visit via Telephone Note   Because of [name]'s co-morbid illnesses, he is at least at moderate risk for complications without adequate follow up.  This format is felt to be most appropriate for this patient at this time.  The patient did not have access to video technology/had technical difficulties with video requiring transitioning to audio format only (telephone).  All issues noted in this document were discussed and addressed.  No physical exam could be performed with this format.  Please refer to the patient's chart for his consent to telehealth for [facility].  Evaluation Performed:  Preoperative cardiovascular risk assessment _____________   Date:  07/22/2023   Patient ID:  [name] [name], DOB 23-Dec-1945, MRN [id number] Patient Location:  Home Provider location:   Office  Primary Care Provider:  Lurena Nida, MD Primary Cardiologist:  Lorine Bears, MD  Chief Complaint / Patient Profile   78 y.o. y/o male with a h/o coronary artery calcification and aortic atherosclerosis, persistent  A. Fib status post DCCV on 02/19/2021, PVCs, DM2, HTN, HLD, iron deficiency anemia requiring IV iron, osteoarthritis, diverticulosis, OSA on CPAP, BPH, and obesity who presents for follow-up of coronary calcification, and PVCs  who is pending reverse shoulder arthoplasty on 07/31/2023 by Dr. Joice Lofts and presents today for telephonic preoperative cardiovascular risk assessment.  History of Present Illness    [name] [name] is a 78 y.o. male who presents via audio/video conferencing for a telehealth visit today.  Pt was last seen in cardiology clinic on 05/21/2023 by Eula Listen, PA.  At that time [name] [name] was doing well.  The patient is now pending procedure as outlined above. Since his last visit, he denies chest pain, lower extremity edema, fatigue, palpitations, melena, hematuria, hemoptysis, diaphoresis, weakness, presyncope, syncope, orthopnea, and PND.  Past Medical  History    Past Medical History:  Diagnosis Date   Anemia    iron deficiency and b12 deficiency.  followed by oncology   Anxiety    Arthritis    Atrial fibrillation (HCC) 12/28/2020   BPH (benign prostatic hyperplasia)    Diabetes mellitus    Diverticulosis    Elevated platelet count    Hemorrhoids    HOH (hard of hearing)    wears hearing aides   Hypertension    OSA on CPAP    Prostate disorder    Spinal stenosis    Past Surgical History:  Procedure Laterality Date   CARDIOVERSION N/A 02/19/2021   Procedure: CARDIOVERSION;  Surgeon: Iran Ouch, MD;  Location: [facility] ORS;  Service: Cardiovascular;  Laterality: N/A;   CATARACT EXTRACTION W/PHACO Left 07/14/2018   Procedure: CATARACT EXTRACTION PHACO AND INTRAOCULAR LENS PLACEMENT (IOC) LEFT, DIABETIC;  Surgeon: Galen Manila, MD;  Location: [facility] ORS;  Service: Ophthalmology;  Laterality: Left;  [place] 00:50 CDE 8.93 Fluid pack lot # [id number]   CATARACT EXTRACTION W/PHACO Right 09/01/2018   Procedure: CATARACT EXTRACTION PHACO AND INTRAOCULAR LENS PLACEMENT (IOC)-RIGHT, DIABETIC;  Surgeon: Galen Manila, MD;  Location: [facility] ORS;  Service: Ophthalmology;  Laterality: Right;  [place] 00:50.9 CDE 6.80 Fluid Pack Lot # [id number]     COLONOSCOPY     CYSTOSCOPY WITH INSERTION OF UROLIFT     CYSTOSCOPY WITH INSERTION OF UROLIFT  08/2018   ESOPHAGOGASTRODUODENOSCOPY (EGD) WITH PROPOFOL N/A 03/17/2019   Procedure: ESOPHAGOGASTRODUODENOSCOPY (EGD) WITH PROPOFOL;  Surgeon: Toledo, Boykin Nearing, MD;  Location: [facility];  Service: Gastroenterology;  Laterality: N/A;   EYE SURGERY Bilateral    cataract  surgery   JOINT REPLACEMENT Bilateral 2014   Total knee replacement.   REPLACEMENT TOTAL KNEE Bilateral    TOTAL HIP ARTHROPLASTY Left 02/23/2020   Procedure: TOTAL HIP ARTHROPLASTY;  Surgeon: Donato Heinz, MD;  Location: [facility] ORS;  Service: Orthopedics;  Laterality: Left;   TOTAL HIP ARTHROPLASTY Right 01/10/2021   Procedure: TOTAL  HIP ARTHROPLASTY;  Surgeon: Donato Heinz, MD;  Location: [facility] ORS;  Service: Orthopedics;  Laterality: Right;    Allergies  No Known Allergies  Home Medications    Prior to Admission medications   Medication Sig Start Date End Date Taking? Authorizing Provider  acetaminophen (TYLENOL) 650 MG CR tablet Take 1,300 mg by mouth every 8 (eight) hours as needed for pain.    [provider]  ALPRAZolam Prudy Feeler) 0.5 MG tablet Take 1 mg by mouth at bedtime as needed for sleep. 11/09/19   [provider]  amLODipine (NORVASC) 5 MG tablet Take 1 tablet by mouth every evening. 08/26/22 08/26/23  [provider]  amoxicillin (AMOXIL) 500 MG capsule Take 2,000 mg by mouth as directed. Take 4 capsules (2000 mg) by mouth 1 hour prior to dental appointment    [provider]  atorvastatin (LIPITOR) 40 MG tablet Take 1 tablet (40 mg total) by mouth daily. Due for follow up visit.  Please call to schedule appointment prior to next refill Patient taking differently: Take 40 mg by mouth every evening. Due for follow up visit.  Please call to schedule appointment prior to next refill 04/21/23   Iran Ouch, MD  benazepril-hydrochlorthiazide (LOTENSIN HCT) 20-12.5 MG tablet Take 1 tablet by mouth in the morning.    [provider]  Cyanocobalamin (VITAMIN B-12) 5000 MCG TBDP Take 10,000 mcg by mouth every evening.    [provider]  ELIQUIS 5 MG TABS tablet TAKE 1 TABLET BY MOUTH 2 TIMES DAILY. 02/21/23   Iran Ouch, MD  ferrous sulfate 325 (65 FE) MG EC tablet Take 325 mg by mouth every evening.    [provider]  flecainide (TAMBOCOR) 50 MG tablet TAKE ONE TABLET BY MOUTH TWICE DAILY 06/27/23   Iran Ouch, MD  glimepiride (AMARYL) 1 MG tablet Take 1 mg by mouth daily with breakfast.    [provider]  JARDIANCE 25 MG TABS tablet Take 25 mg by mouth in the morning. 06/24/23   [provider]  metFORMIN  (GLUCOPHAGE-XR) 500 MG 24 hr tablet Take 1,000 mg by mouth in the morning and at bedtime. 04/17/21   [provider]  metoprolol succinate (TOPROL XL) 25 MG 24 hr tablet Take 1 tablet (25 mg total) by mouth daily. 11/05/22   Dunn, Raymon Mutton, PA-C  naphazoline-glycerin (CLEAR EYES REDNESS) 0.012-0.25 % SOLN Place 1-2 drops into both eyes 4 (four) times daily as needed (dry/irritated eyes.).    [provider]  OZEMPIC, 2 MG/DOSE, 8 MG/3ML SOPN Inject 2 mg into the skin every Thursday at 6pm. 06/27/23   [provider]  sertraline (ZOLOFT) 50 MG tablet Take 50 mg by mouth in the morning.    [provider]  silodosin (RAPAFLO) 8 MG CAPS capsule TAKE 1 CAPSULE BY MOUTH ONCE DAILY WITH BREAKFAST 06/26/23   Stoioff, Verna Czech, MD  traMADol (ULTRAM) 50 MG tablet Take 50 mg by mouth 2 (two) times daily as needed (pain.). 06/30/23   [provider]  Vibegron (GEMTESA) 75 MG TABS Take 1 tablet (75 mg total) by mouth daily. Patient taking  differently: Take 75 mg by mouth every evening. 11/19/22   Stoioff, Verna Czech, MD  zinc gluconate 50 MG tablet Take 50 mg by mouth every evening.    [provider]    Physical Exam    Vital Signs:  [name] [name] does not have vital signs available for review today.  Given telephonic nature of communication, physical exam is limited. AAOx3. NAD. Normal affect.  Speech and respirations are unlabored.  Accessory Clinical Findings    None  Assessment & Plan    1.  Preoperative Cardiovascular Risk Assessment: According to the Revised Cardiac Risk Index (RCRI), his Perioperative Risk of Major Cardiac Event is (%): 0.4. His Functional Capacity in METs is: 5.62 according to the Duke Activity Status Index (DASI). Therefore, based on ACC/AHA guidelines, patient would be at acceptable risk for the planned procedure without further cardiovascular testing.   The patient was advised that if he develops new symptoms prior to surgery to  contact our office to arrange for a follow-up visit, and he verbalized understanding.  Per office protocol, patient can hold Eliquis for 3 days prior to procedure. Please resume Eliquis as soon as possible postprocedure, at the discretion of the surgeon.    A copy of this note will be routed to requesting surgeon.  Time:   Today, I have spent 9 minutes with the patient with telehealth technology discussing medical history, symptoms, and management plan.     Denyce Robert, NP  07/22/2023, 8:45 AM

## 2023-07-22 NOTE — Telephone Encounter (Signed)
Patient with diagnosis of afib on Eliquis for anticoagulation.    Procedure: REVERSE SHOULDER ARTHROPLASTY  Date of procedure: 07/31/23   CHA2DS2-VASc Score = 4   This indicates a 4.8% annual risk of stroke. The patient's score is based upon: CHF History: 0 HTN History: 1 Diabetes History: 1 Stroke History: 0 Vascular Disease History: 0 Age Score: 2 Gender Score: 0      CrCl 112 ml/min Platelet count 487  Per office protocol, patient can hold Eliquis for 3 days prior to procedure.    **This guidance is not considered finalized until pre-operative APP has relayed final recommendations.**

## 2023-07-22 NOTE — Telephone Encounter (Signed)
Pt has been scheduled today for tele preop appt. Med rec and consent are done.      Patient Consent for Virtual Visit        [name] [name] has provided verbal consent on 07/22/2023 for a virtual visit (video or telephone).   CONSENT FOR VIRTUAL VISIT FOR:  [name] [name]  By participating in this virtual visit I agree to the following:  I hereby voluntarily request, consent and authorize [facility] and its employed or contracted physicians, physician assistants, nurse practitioners or other licensed health care professionals (the Practitioner), to provide me with telemedicine health care services (the "Services") as deemed necessary by the treating Practitioner. I acknowledge and consent to receive the Services by the Practitioner via telemedicine. I understand that the telemedicine visit will involve communicating with the Practitioner through live audiovisual communication technology and the disclosure of certain medical information by electronic transmission. I acknowledge that I have been given the opportunity to request an in-person assessment or other available alternative prior to the telemedicine visit and am voluntarily participating in the telemedicine visit.  I understand that I have the right to withhold or withdraw my consent to the use of telemedicine in the course of my care at any time, without affecting my right to future care or treatment, and that the Practitioner or I may terminate the telemedicine visit at any time. I understand that I have the right to inspect all information obtained and/or recorded in the course of the telemedicine visit and may receive copies of available information for a reasonable fee.  I understand that some of the potential risks of receiving the Services via telemedicine include:  Delay or interruption in medical evaluation due to technological equipment failure or disruption; Information transmitted may not be sufficient (e.g. poor resolution  of images) to allow for appropriate medical decision making by the Practitioner; and/or  In rare instances, security protocols could fail, causing a breach of personal health information.  Furthermore, I acknowledge that it is my responsibility to provide information about my medical history, conditions and care that is complete and accurate to the best of my ability. I acknowledge that Practitioner's advice, recommendations, and/or decision may be based on factors not within their control, such as incomplete or inaccurate data provided by me or distortions of diagnostic images or specimens that may result from electronic transmissions. I understand that the practice of medicine is not an exact science and that Practitioner makes no warranties or guarantees regarding treatment outcomes. I acknowledge that a copy of this consent can be made available to me via my patient portal [facility] MyChart), or I can request a printed copy by calling the office of [facility].    I understand that my insurance will be billed for this visit.   I have read or had this consent read to me. I understand the contents of this consent, which adequately explains the benefits and risks of the Services being provided via telemedicine.  I have been provided ample opportunity to ask questions regarding this consent and the Services and have had my questions answered to my satisfaction. I give my informed consent for the services to be provided through the use of telemedicine in my medical care

## 2023-07-22 NOTE — Telephone Encounter (Signed)
Pt has been scheduled today for tele preop appt. Med rec and consent are done.

## 2023-07-22 NOTE — Telephone Encounter (Signed)
   Name: [name] [name]  DOB: May 06, 1946  MRN: [id number]  Primary Cardiologist: Lorine Bears, MD   Preoperative team, please contact this patient and set up a phone call appointment for further preoperative risk assessment. Please obtain consent and complete medication review. Thank you for your help.  I confirm that guidance regarding antiplatelet and oral anticoagulation therapy has been completed and, if necessary, noted below.  Per office protocol, patient can hold Eliquis for 3 days prior to procedure. Please resume Eliquis as soon as possible postprocedure, at the discretion of the surgeon.   I also confirmed the patient resides in the state of [place]. As per [facility] Medical Board telemedicine laws, the patient must reside in the state in which the provider is licensed.   Joylene Grapes, NP 07/22/2023, 8:19 AM [facility]

## 2023-07-23 ENCOUNTER — Other Ambulatory Visit: Payer: Self-pay | Admitting: Cardiovascular Disease

## 2023-07-24 ENCOUNTER — Other Ambulatory Visit: Payer: Self-pay

## 2023-07-24 ENCOUNTER — Encounter
Admission: RE | Admit: 2023-07-24 | Discharge: 2023-07-24 | Disposition: A | Discharge status: Home / Self Care | Payer: Medicare Other | Source: Ambulatory Visit | Attending: Surgery | Admitting: Surgery

## 2023-07-24 VITALS — BP 138/84 | HR 82 | Resp 16 | Ht 69.5 in | Wt 260.0 lb

## 2023-07-24 DIAGNOSIS — E119 Type 2 diabetes mellitus without complications: Secondary | ICD-10-CM

## 2023-07-24 DIAGNOSIS — Z01818 Encounter for other preprocedural examination: Secondary | ICD-10-CM | POA: Diagnosis present

## 2023-07-24 LAB — CBC WITH DIFFERENTIAL/PLATELET
Abs Immature Granulocytes: 0.08 10*3/uL — ABNORMAL HIGH (ref 0.00–0.07)
Basophils Absolute: 0.1 10*3/uL (ref 0.0–0.1)
Basophils Relative: 1 %
Eosinophils Absolute: 0.2 10*3/uL (ref 0.0–0.5)
Eosinophils Relative: 2 %
HCT: 37.1 % — ABNORMAL LOW (ref 39.0–52.0)
Hemoglobin: 12.4 g/dL — ABNORMAL LOW (ref 13.0–17.0)
Immature Granulocytes: 1 %
Lymphocytes Relative: 18 %
Lymphs Abs: 2.3 10*3/uL (ref 0.7–4.0)
MCH: 30.2 pg (ref 26.0–34.0)
MCHC: 33.4 g/dL (ref 30.0–36.0)
MCV: 90.5 fL (ref 80.0–100.0)
Monocytes Absolute: 1.3 10*3/uL — ABNORMAL HIGH (ref 0.1–1.0)
Monocytes Relative: 10 %
Neutro Abs: 9 10*3/uL — ABNORMAL HIGH (ref 1.7–7.7)
Neutrophils Relative %: 68 %
Platelets: 499 10*3/uL — ABNORMAL HIGH (ref 150–400)
RBC: 4.1 MIL/uL — ABNORMAL LOW (ref 4.22–5.81)
RDW: 13.8 % (ref 11.5–15.5)
WBC: 13 10*3/uL — ABNORMAL HIGH (ref 4.0–10.5)
nRBC: 0 % (ref 0.0–0.2)

## 2023-07-24 LAB — SURGICAL PCR SCREEN
MRSA, PCR: NEGATIVE
Staphylococcus aureus: POSITIVE — AB

## 2023-07-24 LAB — URINALYSIS, ROUTINE W REFLEX MICROSCOPIC
Bacteria, UA: NONE SEEN
Bilirubin Urine: NEGATIVE
Glucose, UA: >=500 mg/dL — AB
Ketones, ur: NEGATIVE mg/dL
Leukocytes,Ua: NEGATIVE
Nitrite: NEGATIVE
Protein, ur: NEGATIVE mg/dL
Specific Gravity, Urine: 1.011 (ref 1.005–1.030)
pH: 6 (ref 5.0–8.0)

## 2023-07-24 LAB — COMPREHENSIVE METABOLIC PANEL
ALT: 18 U/L (ref 0–44)
AST: 19 U/L (ref 15–41)
Albumin: 3.5 g/dL (ref 3.5–5.0)
Alkaline Phosphatase: 84 U/L (ref 38–126)
Anion gap: 12 (ref 5–15)
BUN: 18 mg/dL (ref 8–23)
CO2: 25 mmol/L (ref 22–32)
Calcium: 9.1 mg/dL (ref 8.9–10.3)
Chloride: 93 mmol/L — ABNORMAL LOW (ref 98–111)
Creatinine, Ser: 0.74 mg/dL (ref 0.61–1.24)
GFR, Estimated: >60 mL/min (ref >60)
Glucose, Bld: 107 mg/dL — ABNORMAL HIGH (ref 70–99)
Potassium: 4 mmol/L (ref 3.5–5.1)
Sodium: 130 mmol/L — ABNORMAL LOW (ref 135–145)
Total Bilirubin: 0.6 mg/dL (ref 0.0–1.2)
Total Protein: 8 g/dL (ref 6.5–8.1)

## 2023-07-24 NOTE — Patient Instructions (Addendum)
Your procedure is scheduled on: Thursday 07/31/23 To find out your arrival time, please call [phone] between 1PM - 3PM on:  Wednesday 07/30/23  Report to the Registration Desk on the 1st floor of [facility]. FREE Valet parking is available.  If your arrival time is 6:00 am, do not arrive before that time as the [facility] entrance doors do not open until 6:00 am.  REMEMBER: Instructions that are not followed completely may result in serious medical risk, up to and including death; or upon the discretion of your surgeon and anesthesiologist your surgery may need to be rescheduled.  Do not eat food after midnight the night before surgery.  No gum chewing or hard candies.  You may however, drink CLEAR liquids up to 2 hours before you are scheduled to arrive for your surgery. Do not drink anything within 2 hours of your scheduled arrival time.  Clear liquids include: - water   Type 1 and Type 2 diabetics should only drink water.  In addition, your doctor has ordered for you to drink the provided:  Gatorade G2 Drinking this carbohydrate drink up to two hours before surgery helps to reduce insulin resistance and improve patient outcomes. Please complete drinking 2 hours before scheduled arrival time.  One week prior to surgery: Stop Anti-inflammatories (NSAIDS) such as Advil, Aleve, Ibuprofen, Motrin, Naproxen, Naprosyn and Aspirin based products such as Excedrin, Goody's Powder, BC Powder. You may however, continue to take Tylenol if needed for pain up until the day of surgery.  Stop ANY OVER THE COUNTER supplements and vitamins for 7 days until after surgery. Last dose today 07/25/23  Continue taking all prescribed medications with the exception of the following: Metformin hold 2 days with last dose Mon 07/28/23. Eliquis hold 3 days with last dose Sunday 07/27/23. Jardiance hold 3 days with last dose 07/27/23. Ozempic hold 7 days last dose today 07/24/23.  TAKE ONLY THESE  MEDICATIONS THE MORNING OF SURGERY WITH A SIP OF WATER:  flecainide (TAMBOCOR) 50 MG table  metoprolol succinate (TOPROL XL) 25 MG 24 hr tablet  sertraline (ZOLOFT) 50 MG tablet  silodosin (RAPAFLO) 8 MG CAPS capsule   No Alcohol for 24 hours before or after surgery.  No Smoking including e-cigarettes for 24 hours before surgery.  No chewable tobacco products for at least 6 hours before surgery.  No nicotine patches on the day of surgery.  Do not use any "recreational" drugs for at least a week (preferably 2 weeks) before your surgery.  Please be advised that the combination of cocaine and anesthesia may have negative outcomes, up to and including death. If you test positive for cocaine, your surgery will be cancelled.  On the morning of surgery brush your teeth with toothpaste and water, you may rinse your mouth with mouthwash if you wish. Do not swallow any toothpaste or mouthwash.  Use CHG Soap or wipes as directed on instruction sheet.  Do not wear lotions, powders, or perfumes. NO DEODORANT  Do not shave body hair from the neck down 48 hours before surgery.  Wear clean comfortable clothing (specific to your surgery type) to the hospital.  Do not wear jewelry, make-up, hairpins, clips or nail polish.  For welded (permanent) jewelry: bracelets, anklets, waist bands, etc.  Please have this removed prior to surgery.  If it is not removed, there is a chance that hospital personnel will need to cut it off on the day of surgery. Contact lenses, hearing aids and dentures may  not be worn into surgery.  Bring your C-PAP to the hospital in case you may have to spend the night.   Do not bring valuables to the hospital. [facility] is not responsible for any missing/lost belongings or valuables.   Total Shoulder Arthroplasty:  use Benzoyl Peroxide 5% Gel as directed on instruction sheet.  Notify your doctor if there is any change in your medical condition (cold, fever,  infection).  If you are being discharged the day of surgery, you will not be allowed to drive home. You will need a responsible individual to drive you home and stay with you for 24 hours after surgery.   If you are taking public transportation, you will need to have a responsible individual with you.  If you are being admitted to the hospital overnight, leave your suitcase in the car. After surgery it may be brought to your room.  In case of increased patient census, it may be necessary for you, the patient, to continue your postoperative care in the Same Day Surgery department.  After surgery, you can help prevent lung complications by doing breathing exercises.  Take deep breaths and cough every 1-2 hours. Your doctor may order a device called an Incentive Spirometer to help you take deep breaths. When coughing or sneezing, hold a pillow firmly against your incision with both hands. This is called "splinting." Doing this helps protect your incision. It also decreases belly discomfort.  Surgery Visitation Policy:  Patients undergoing a surgery or procedure may have two family members or support persons with them as long as the person is not COVID-19 positive or experiencing its symptoms.   Inpatient Visitation:    Visiting hours are 7 a.m. to 8 p.m. Up to four visitors are allowed at one time in a patient room. The visitors may rotate out with other people during the day. One designated support person (adult) may remain overnight.  Due to an increase in RSV and influenza rates and associated hospitalizations, children ages 35 and under will not be able to visit patients in [facility]. Masks continue to be strongly recommended.  Please call the Pre-admissions Testing Dept. at [phone] if you have any questions about these instructions.    Pre-operative 5 CHG Bath Instructions   You can play a key role in reducing the risk of infection after surgery. Your skin needs  to be as free of germs as possible. You can reduce the number of germs on your skin by washing with CHG (chlorhexidine gluconate) soap before surgery. CHG is an antiseptic soap that kills germs and continues to kill germs even after washing.   DO NOT use if you have an allergy to chlorhexidine/CHG or antibacterial soaps. If your skin becomes reddened or irritated, stop using the CHG and notify one of our RNs at [phone].   Please shower with the CHG soap starting 4 days before surgery using the following schedule:   Sunday 07/27/23 - Thursday 07/31/23    Please keep in mind the following:  DO NOT shave, including legs and underarms, starting the day of your first shower.   You may shave your face at any point before/day of surgery.  Place clean sheets on your bed the day you start using CHG soap. Use a clean washcloth (not used since being washed) for each shower. DO NOT sleep with pets once you start using the CHG.   CHG Shower Instructions:  If you choose to wash your hair and private area,  wash first with your normal shampoo/soap.  After you use shampoo/soap, rinse your hair and body thoroughly to remove shampoo/soap residue.  Turn the water OFF and apply about 3 tablespoons (45 ml) of CHG soap to a CLEAN washcloth.  Apply CHG soap ONLY FROM YOUR NECK DOWN TO YOUR TOES (washing for 3-5 minutes)  DO NOT use CHG soap on face, private areas, open wounds, or sores.  Pay special attention to the area where your surgery is being performed.  If you are having back surgery, having someone wash your back for you may be helpful. Wait 2 minutes after CHG soap is applied, then you may rinse off the CHG soap.  Pat dry with a clean towel  Put on clean clothes/pajamas   If you choose to wear lotion, please use ONLY the CHG-compatible lotions on the back of this paper.     Additional instructions for the day of surgery: DO NOT APPLY any lotions, deodorants, cologne, or perfumes.   Put on  clean/comfortable clothes.  Brush your teeth.  Ask your nurse before applying any prescription medications to the skin.      CHG Compatible Lotions   Aveeno Moisturizing lotion  Cetaphil Moisturizing Cream  Cetaphil Moisturizing Lotion  Clairol Herbal Essence Moisturizing Lotion, Dry Skin  Clairol Herbal Essence Moisturizing Lotion, Extra Dry Skin  Clairol Herbal Essence Moisturizing Lotion, Normal Skin  Curel Age Defying Therapeutic Moisturizing Lotion with Alpha Hydroxy  Curel Extreme Care Body Lotion  Curel Soothing Hands Moisturizing Hand Lotion  Curel Therapeutic Moisturizing Cream, Fragrance-Free  Curel Therapeutic Moisturizing Lotion, Fragrance-Free  Curel Therapeutic Moisturizing Lotion, Original Formula  Eucerin Daily Replenishing Lotion  Eucerin Dry Skin Therapy Plus Alpha Hydroxy Crme  Eucerin Dry Skin Therapy Plus Alpha Hydroxy Lotion  Eucerin Original Crme  Eucerin Original Lotion  Eucerin Plus Crme Eucerin Plus Lotion  Eucerin TriLipid Replenishing Lotion  Keri Anti-Bacterial Hand Lotion  Keri Deep Conditioning Original Lotion Dry Skin Formula Softly Scented  Keri Deep Conditioning Original Lotion, Fragrance Free Sensitive Skin Formula  Keri Lotion Fast Absorbing Fragrance Free Sensitive Skin Formula  Keri Lotion Fast Absorbing Softly Scented Dry Skin Formula  Keri Original Lotion  Keri Skin Renewal Lotion Keri Silky Smooth Lotion  Keri Silky Smooth Sensitive Skin Lotion  Nivea Body Creamy Conditioning Oil  Nivea Body Extra Enriched Lotion  Nivea Body Original Lotion  Nivea Body Sheer Moisturizing Lotion Nivea Crme  Nivea Skin Firming Lotion  NutraDerm 30 Skin Lotion  NutraDerm Skin Lotion  NutraDerm Therapeutic Skin Cream  NutraDerm Therapeutic Skin Lotion  ProShield Protective Hand Cream  Provon moisturizing lotion  Preparing for Total Shoulder Arthroplasty  Before surgery, you can play an important role by reducing the number of germs on your  skin by using the following products:  Benzoyl Peroxide Gel  o Reduces the number of germs present on the skin  o Applied twice a day to shoulder area starting two days before surgery  Chlorhexidine Gluconate (CHG) Soap  o An antiseptic cleaner that kills germs and bonds with the skin to continue killing germs even after washing  o Used for showering the night before surgery and morning of surgery  BENZOYL PEROXIDE 5% GEL  Please do not use if you have an allergy to benzoyl peroxide. If your skin becomes reddened/irritated stop using the benzoyl peroxide.  Starting two days before surgery, apply as follows:  1. Apply benzoyl peroxide in the morning and at night. Apply after taking a shower. If you are  not taking a shower, clean entire shoulder front, back, and side along with the armpit with a clean wet washcloth.  2. Place a quarter-sized dollop on your shoulder and rub in thoroughly, making sure to cover the front, back, and side of your shoulder, along with the armpit.  2 days before ____ AM ____ PM (Tues 07/29/23)  1 day before ____ AM ____ PM (Wed 07/30/23)  3. Do this twice a day for two days. (Last application is the night before surgery, AFTER using the CHG soap).  4. Do NOT apply benzoyl peroxide gel on the day of surgery.

## 2023-07-25 NOTE — Progress Notes (Signed)
  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 In review of his medication reconciliation it was noted that patient is on a prescribed GLP-1 medication. Per guidelines issued by the American Society of Anesthesiologists (ASA), it is recommended that these medications be held for 7 days prior to the patient undergoing any type of elective surgical procedure. The patient is taking the following GLP-1 medication:  [x]  SEMAGLUTIDE   []  EXENATIDE  []  LIRAGLUTIDE   []  LIXISENATIDE  []  DULAGLUTIDE     []  TIRZEPATIDE (GLP-1/GIP)  Reached out to prescribing provider Dareen Piano, MD) to make them aware of the guidelines from anesthesia. Given that this patient takes the prescribed GLP-1 medication for his  diabetes diagnosis, rather than for weight loss, recommendations from the prescribing provider were solicited. Prescribing provider made aware of the following so that informed decision/POC can be developed for this patient that may be taking medications belonging to these drug classes:  Oral GLP-1 medications will be held 1 day prior to surgery.  Injectable GLP-1 medications will be held 7 days prior to surgery.  Metformin is routinely held 48 hours prior to surgery due to renal concerns, potential need for contrasted imaging perioperatively, and the  potential for tissue hypoxia leading to drug induced lactic acidosis.  All SGLT2i medications are held 72 hours prior to surgery as they can be associated with the increased potential for developing euglycemic diabetic ketoacidosis (EDKA).   Impression and Plan:  [name] [name] is on a prescribed GLP-1 medication, which induces the known side effect of decreased gastric emptying. Efforts are bring made to mitigate the risk of perioperative hyperglycemic events, as elevated blood glucose levels have been found to contribute to intra/postoperative complications. Additionally, hyperglycemic extremes can potentially necessitate the postponing of a patient's elective case in order to better optimize perioperative glycemic control, again with the aforementioned guidelines in place. With this in mind, recommendations have been sought from the prescribing provider, who has cleared patient to proceed with holding the prescribed GLP-1 as per the guidelines from the ASA.   Provider indicates that patient is at risk for hyperglycemic events. Dr. Dareen Piano recommending: check glucose prior to surgery and provide PRN insulin should it be needed.   Copy of signed clearance and recommendations placed on patient's chart for inclusion in their medical record and for review by the surgical/anesthetic team on the day of his procedure.   Quentin Mulling, MSN, APRN, FNP-C, CEN [facility]  Perioperative Services Nurse Practitioner Phone: [phone] Fax: [phone] 07/25/23 1:14 PM  NOTE: This note has been prepared using Dragon dictation software. Despite my best ability to proofread, there is always the potential that unintentional transcriptional errors may still occur from this process.

## 2023-07-29 ENCOUNTER — Encounter: Payer: Self-pay | Admitting: Surgery

## 2023-07-29 NOTE — Progress Notes (Signed)
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 Clinical nursing staff has updated patient's PMH/PSHx, current medication list, and drug allergies/intolerances to ensure comprehensive history available to assist in medical decision making as it pertains to the aforementioned surgical procedure and anticipated anesthetic course. Extensive review of available clinical information personally performed. [place] PMH and PSHx updated with any diagnoses/procedures that  may have been inadvertently omitted during his intake with the pre-admission testing department's nursing staff.  Clinical Discussion:  [name] [name] is a 78 y.o. male who is submitted for pre-surgical anesthesia review and clearance prior to him undergoing the above procedure. Patient has never been a smoker in the past. Pertinent PMH includes: CAD, diastolic dysfunction, PSVT, atrial fibrillation, frequent PVCs, ascending aorta dilatation, aortic atherosclerosis, HTN, HLD, T2DM, OSAH (requires nocturnal PAP therapy), IDA, thrombocytosis, OA, lumbar DDD with pelvic spinal stenosis, BPH, anxiety (on BZO).  Patient is followed by cardiology Kirke Corin, MD). He was last seen in the cardiology clinic on 05/21/2023; notes reviewed. At the time of his clinic visit, patient doing well  overall from a cardiovascular perspective. Patient denied any chest pain, shortness of breath, PND, orthopnea, palpitations, significant peripheral edema, weakness, fatigue, vertiginous symptoms, or presyncope/syncope. Patient with a past medical history significant for cardiovascular diagnoses. Documented physical exam was grossly benign, providing no evidence of acute exacerbation and/or decompensation of the patient's known cardiovascular conditions.  Long-term event monitor study performed on 03/26/2021 revealing a predominant underlying sinus rhythm with first-degree AV block at an average rate of 81 bpm; range 63-1 21.  There was a single run of PSVT noted lasting 4 beats at a maximum rate of 96 bpm.  Rare atrial ectopy.  There was frequent ventricular ectopy accounting for 89.6% study burden.  Myocardial perfusion imaging study was performed on 04/13/2021 revealing a mildly reduced left ventricular systolic function with an EF of 47%.  There were no regional wall motion abnormalities.  No artifact or left ventricular cavity size enlargement appreciated on review of imaging. SPECT images demonstrated no evidence of stress-induced myocardial ischemia or arrhythmia; no scintigraphic evidence of scar.  Attenuation correction images revealed mild coronary calcifications and aortic atherosclerosis.  GI uptake artifact noted.  Study determined to be normal and low risk.  Exercise tolerance test performed on 07/12/2021.  Patient able to exercise for total of 5 minutes achieving a maximum heart rate of 160 bpm, which was 110% of his MPHR.  Peak METS were 7.0.  There were no significant EKG changes or arrhythmias noted during stress or recovery.  ECG nonischemic.  Study determined to be normal.  Most recent TTE performed on 09/26/2022 revealed a normal left ventricular systolic function with an EF of 65%. There were no regional wall motion abnormalities. Left ventricular diastolic Doppler parameters consistent  with abnormal relaxation (G1DD). Right ventricular  size and function normal.  Left atrium mildly dilated.  There was no significant valvular regurgitation. All transvalvular gradients were noted to be normal providing no evidence suggestive of valvular stenosis.  Borderline dilatation of the ascending aorta noted measuring 36 mm; aortic root 39 mm.  Patient with an atrial fibrillation diagnosis; CHA2DS2-VASc Score = 5 (age x 2, HTN, vascular disease history, T2DM).patient status post DCCV procedure on 02/19/2021, at which time he received a single 200 J synchronized cardioversion restoring NSR.  Currently his cardiac rate and rhythm are being maintained on oral flecainide + metoprolol succinate.  Being maintained on oral flecainide + metoprolol succinate. He is chronically anticoagulated using apixaban; reported to be compliant with therapy with no evidence or reports of GI/GU bleeding.  Blood pressure well controlled at 114/60 mmHg on currently prescribed CCB (amlodipine), ACEi (benazepril), diuretic (HCTZ), and beta-blocker (metoprolol succinate) therapies. He is on atorvastatin for his HLD diagnosis and further ASCVD prevention. T2DM loosely controlled on currently prescribed regimen; last HgbA1c was 7.9% when checked on 04/25/2023.  Patient does have an OSAH diagnosis and is reported to be compliant with prescribed nocturnal PAP therapy.  Functional capacity currently limited by lumbar spinal stenosis and arthritic pain in his LEFT shoulder.  With that said, patient is able to complete all of his ADLs/IADLs independently without significant cardiovascular limitation.  Per the DASI, patient is able to achieve at least 4 METS of physical activity without experiencing any significant degrees of angina/anginal equivalent symptoms.  No changes were made to his medication regimen.  Patient to follow-up with outpatient cardiology in 6 months or sooner if needed.  [name] [name] is scheduled for an elective REVERSE  SHOULDER ARTHROPLASTY (Right: Shoulder) on 07/30/2022 with Dr. Leron Croak, MD. Given patient's past medical history significant for cardiovascular diagnoses, presurgical cardiac clearance was sought by the PAT team.  Per cardiology, "according to the Revised Cardiac Risk Index (RCRI), his Perioperative Risk of Major Cardiac Event is (%): 0.4. His Functional Capacity in METs is: 5.62 according to the [facility] Activity Status Index (DASI). Therefore, based on ACC/AHA guidelines, patient would be at ACCEPTABLE risk for the planned procedure without further cardiovascular testing".  Again, this patient is on daily oral anticoagulation therapy using a DOAC medication.  He has been instructed on recommendations for holding his apixaban for 3 days prior to his procedure with plans to restart as soon as postoperative bleeding risk felt to be minimized by his attending surgeon. The patient has been instructed that his last dose of apixaban should be on 07/26/2022.  Patient denies previous perioperative complications with anesthesia in the past. In review his EMR, it is noted that patient underwent a general anesthetic course here at [facility] ([facility] in 01/2021 without documented complications.      07/24/2023   11:38 AM 05/21/2023    7:59 AM 05/16/2023    1:29 PM  Vitals with BMI  Height 5' 9.5" 5' 9.5"   Weight 260 lbs 259 lbs 264 lbs 2 oz  BMI 37.86 37.71 38.98  Systolic 138 114 413  Diastolic 84 60 79  Pulse 82 85 88   Providers/Specialists:  NOTE: Primary physician provider listed below. Patient may have been seen by APP or partner within same practice.   PROVIDER ROLE / SPECIALTY LAST OV  Poggi, Excell Seltzer, MD Orthopedics (Surgeon) 06/17/2023  Lurena Nida, MD Primary Care Provider 06/05/2023  Lorine Bears, MD Cardiology 05/21/2023; preop APP call 07/22/2023  Owens Shark,  MD Hematology/Medical Oncology 05/16/2023   Allergies:  No Known Allergies Current  Home Medications:   No current facility-administered medications for this encounter.    acetaminophen (TYLENOL) 650 MG CR tablet   ALPRAZolam (XANAX) 0.5 MG tablet   amLODipine (NORVASC) 5 MG tablet   amoxicillin (AMOXIL) 500 MG capsule   benazepril-hydrochlorthiazide (LOTENSIN HCT) 20-12.5 MG tablet   Cyanocobalamin (VITAMIN B-12) 5000 MCG TBDP   ELIQUIS 5 MG TABS tablet   ferrous sulfate 325 (65 FE) MG EC tablet   flecainide (TAMBOCOR) 50 MG tablet   glimepiride (AMARYL) 1 MG tablet   JARDIANCE 25 MG TABS tablet   metFORMIN (GLUCOPHAGE-XR) 500 MG 24 hr tablet   metoprolol succinate (TOPROL XL) 25 MG 24 hr tablet   naphazoline-glycerin (CLEAR EYES REDNESS) 0.012-0.25 % SOLN   OZEMPIC, 2 MG/DOSE, 8 MG/3ML SOPN   sertraline (ZOLOFT) 50 MG tablet   silodosin (RAPAFLO) 8 MG CAPS capsule   traMADol (ULTRAM) 50 MG tablet   Vibegron (GEMTESA) 75 MG TABS   zinc gluconate 50 MG tablet   atorvastatin (LIPITOR) 40 MG tablet   History:   Past Medical History:  Diagnosis Date   Anxiety    a.) on BZO (alprazolam) PRN   Aortic atherosclerosis (HCC)    Arthritis    Ascending aorta dilation (HCC) 09/26/2022   a.) TTE 09/26/2022: asc Ao 36 mm, Ao root 39 mm   Atrial fibrillation (HCC) 12/28/2020   a.) CHA2DS2-VASc = 5 (age x2, HTN, vascular disease history, T2DM) as of 07/29/2023; b.) s/p DCCV 02/19/2021  --> 200 J x1 -> NSR; c.) cardiac rate/rhythm maintained on oral flecainide + metoprolol succinate; chronically anticoagulated using apixaban   B12 deficiency    Bilateral adrenal adenomas    BPH (benign prostatic hyperplasia)    CAD (coronary artery disease)    Cholelithiasis    DDD (degenerative disc disease), lumbar    Diastolic dysfunction 09/26/2019   a.) TTE 09/26/2019: EF 60-65%, no RWMAs, G1DD, mild LAE, norm RVSF, asc Ao 36 mm, Ao root 39 mm   Diverticulosis    Elevated platelet count    Hemorrhoids    HLD (hyperlipidemia)    HOH (hard of hearing); wears BILATERAL hearing  aids    Hypertension    IDA (iron deficiency anemia)    Left hydrocele    Left inguinal hernia    On apixaban therapy    OSA on CPAP    Pelvic lymphadenopathy    PSVT (paroxysmal supraventricular tachycardia) (HCC) 03/26/2021   a.) Zio 03/26/2021: 4 beat run at max rate 96 bpm   PVC's (premature ventricular contractions)    a.) Zio 03/26/2021: frequent (9.6% burden)   Renal cyst, left    Spinal stenosis    Status post bilateral cataract extraction 2020   T2DM (type 2 diabetes mellitus) (HCC)    Past Surgical History:  Procedure Laterality Date   CARDIOVERSION N/A 02/19/2021   Procedure: CARDIOVERSION;  Surgeon: Iran Ouch, MD;  Location: [facility] ORS;  Service: Cardiovascular;  Laterality: N/A;   CATARACT EXTRACTION W/PHACO Left 07/14/2018   Procedure: CATARACT EXTRACTION PHACO AND INTRAOCULAR LENS PLACEMENT (IOC) LEFT, DIABETIC;  Surgeon: Galen Manila, MD;  Location: [facility] ORS;  Service: Ophthalmology;  Laterality: Left;  [place] 00:50 CDE 8.93 Fluid pack lot # [id number]   CATARACT EXTRACTION W/PHACO Right 09/01/2018   Procedure: CATARACT EXTRACTION PHACO AND INTRAOCULAR LENS PLACEMENT (IOC)-RIGHT, DIABETIC;  Surgeon: Galen Manila, MD;  Location: [facility] ORS;  Service: Ophthalmology;  Laterality: Right;  [place]  00:50.9 CDE 6.80 Fluid Pack Lot # [id number]     COLONOSCOPY     CYSTOSCOPY WITH INSERTION OF UROLIFT     CYSTOSCOPY WITH INSERTION OF UROLIFT  08/2018   ESOPHAGOGASTRODUODENOSCOPY (EGD) WITH PROPOFOL N/A 03/17/2019   Procedure: ESOPHAGOGASTRODUODENOSCOPY (EGD) WITH PROPOFOL;  Surgeon: Toledo, Boykin Nearing, MD;  Location: [facility] ENDOSCOPY;  Service: Gastroenterology;  Laterality: N/A;   TOTAL HIP ARTHROPLASTY Left 02/23/2020   Procedure: TOTAL HIP ARTHROPLASTY;  Surgeon: Donato Heinz, MD;  Location: [facility] ORS;  Service: Orthopedics;  Laterality: Left;   TOTAL HIP ARTHROPLASTY Right 01/10/2021   Procedure: TOTAL HIP ARTHROPLASTY;  Surgeon: Donato Heinz, MD;  Location: [facility]  ORS;  Service: Orthopedics;  Laterality: Right;   TOTAL KNEE ARTHROPLASTY Bilateral 2014   Total knee replacement.   Family History  Problem Relation Age of Onset   Diabetes Mother    Alzheimer's disease Mother    Colon cancer Sister    Social History   Tobacco Use   Smoking status: Never   Smokeless tobacco: Never  Substance Use Topics   Alcohol use: Yes    Comment: wine-occasionally Previously drank a shot of vodka in the evenings   Pertinent Clinical Results:  LABS:  Hospital Outpatient Visit on 07/24/2023  Component Date Value Ref Range Status   WBC 07/24/2023 13.0 (H)  4.0 - 10.5 K/uL Final   RBC 07/24/2023 4.10 (L)  4.22 - 5.81 MIL/uL Final   Hemoglobin 07/24/2023 12.4 (L)  13.0 - 17.0 g/dL Final   HCT 66/44/0347 37.1 (L)  39.0 - 52.0 % Final   MCV 07/24/2023 90.5  80.0 - 100.0 fL Final   MCH 07/24/2023 30.2  26.0 - 34.0 pg Final   MCHC 07/24/2023 33.4  30.0 - 36.0 g/dL Final   RDW 42/59/5638 13.8  11.5 - 15.5 % Final   Platelets 07/24/2023 499 (H)  150 - 400 K/uL Final   nRBC 07/24/2023 0.0  0.0 - 0.2 % Final   Neutrophils Relative % 07/24/2023 68  % Final   Neutro Abs 07/24/2023 9.0 (H)  1.7 - 7.7 K/uL Final   Lymphocytes Relative 07/24/2023 18  % Final   Lymphs Abs 07/24/2023 2.3  0.7 - 4.0 K/uL Final   Monocytes Relative 07/24/2023 10  % Final   Monocytes Absolute 07/24/2023 1.3 (H)  0.1 - 1.0 K/uL Final   Eosinophils Relative 07/24/2023 2  % Final   Eosinophils Absolute 07/24/2023 0.2  0.0 - 0.5 K/uL Final   Basophils Relative 07/24/2023 1  % Final   Basophils Absolute 07/24/2023 0.1  0.0 - 0.1 K/uL Final   Immature Granulocytes 07/24/2023 1  % Final   Abs Immature Granulocytes 07/24/2023 0.08 (H)  0.00 - 0.07 K/uL Final   Performed at [facility], [address] [place], [place] [address]   Sodium 07/24/2023 130 (L)  135 - 145 mmol/L Final   Potassium 07/24/2023 4.0  3.5 - 5.1 mmol/L Final   Chloride 07/24/2023 93 (L)  98 - 111 mmol/L Final    CO2 07/24/2023 25  22 - 32 mmol/L Final   Glucose, Bld 07/24/2023 107 (H)  70 - 99 mg/dL Final   Glucose reference range applies only to samples taken after fasting for at least 8 hours.   BUN 07/24/2023 18  8 - 23 mg/dL Final   Creatinine, Ser 07/24/2023 0.74  0.61 - 1.24 mg/dL Final   Calcium 32/95/1884 9.1  8.9 - 10.3 mg/dL Final   Total Protein 16/60/6301 8.0  6.5 - 8.1 g/dL Final   Albumin 16/04/9603 3.5  3.5 - 5.0 g/dL Final   AST 54/03/8118 19  15 - 41 U/L Final   ALT 07/24/2023 18  0 - 44 U/L Final   Alkaline Phosphatase 07/24/2023 84  38 - 126 U/L Final   Total Bilirubin 07/24/2023 0.6  0.0 - 1.2 mg/dL Final   GFR, Estimated 07/24/2023 >60  >60 mL/min Final   Comment: (NOTE) Calculated using the CKD-EPI Creatinine Equation (2021)    Anion gap 07/24/2023 12  5 - 15 Final   Performed at [facility], [address] [place], [place] [address]   Color, Urine 07/24/2023 STRAW (A)  YELLOW Final   APPearance 07/24/2023 CLEAR (A)  CLEAR Final   Specific Gravity, Urine 07/24/2023 1.011  1.005 - 1.030 Final   pH 07/24/2023 6.0  5.0 - 8.0 Final   Glucose, UA 07/24/2023 >=500 (A)  NEGATIVE mg/dL Final   Hgb urine dipstick 07/24/2023 SMALL (A)  NEGATIVE Final   Bilirubin Urine 07/24/2023 NEGATIVE  NEGATIVE Final   Ketones, ur 07/24/2023 NEGATIVE  NEGATIVE mg/dL Final   Protein, ur 95/62/1308 NEGATIVE  NEGATIVE mg/dL Final   Nitrite 65/78/4696 NEGATIVE  NEGATIVE Final   Leukocytes,Ua 07/24/2023 NEGATIVE  NEGATIVE Final   RBC / HPF 07/24/2023 0-5  0 - 5 RBC/hpf Final   WBC, UA 07/24/2023 0-5  0 - 5 WBC/hpf Final   Bacteria, UA 07/24/2023 NONE SEEN  NONE SEEN Final   Squamous Epithelial / HPF 07/24/2023 0-5  0 - 5 /HPF Final   Performed at [facility], [address] [place], [place] [address]   MRSA, PCR 07/24/2023 NEGATIVE  NEGATIVE Final   Staphylococcus aureus 07/24/2023 POSITIVE (A)  NEGATIVE Final   Comment: (NOTE) The Xpert SA Assay (FDA approved for NASAL  specimens in patients 2 years of age and older), is one component of a comprehensive surveillance program. It is not intended to diagnose infection nor to guide or monitor treatment. Performed at [facility], [address] [place], [place] [address]     ECG: Date: 05/21/2023  Time ECG obtained: 0805 AM Rate: 85 bpm Rhythm:  Sinus rhythm with first-degree AV block Axis (leads I and aVF): normal Intervals: PR 226 ms. QRS 100 ms. QTc 430 ms. ST segment and T wave changes: No evidence of acute T wave abnormalities or significant ST segment elevation or depression.   Comparison: Similar to previous tracing obtained on 08/13/2022   IMAGING / PROCEDURES: CT SHOULDER RIGHT WO CONTRAST performed on 06/06/2023 Severe osteoarthritis of the glenohumeral joint. Moderate-severe atrophy of the supraspinatus muscle. Rotator cuff is limited evaluation by CT. Probable full-thickness tear of the supraspinatus tendon, but if there is further clinical concern, recommend a MRI of the right shoulder.  CT ABDOMEN PELVIS W CONTRAST performed on 05/14/2023 Similar to minimal decrease in left pelvic sidewall adenopathy.Relative stability back to 07/31/2022 favors a benign etiology. Depending on clinical concern, 1 further follow-up at 6 months with pelvic CT could be performed. No acute process in the abdomen or pelvis. Degraded evaluation of the pelvis, secondary to beam hardening artifact from bilateral hip arthroplasty Rectal wall thickening is relatively mild and felt to be similar on prior exams. Correlate with colon cancer screening history and if not up-to-date, consider colonoscopy or sigmoidoscopy. Cholelithiasis.  Aortic atherosclerosis   TRANSTHORACIC ECHOCARDIOGRAM performed on 09/26/2022 Left ventricular ejection fraction, by estimation, is 60 to 65%. The left ventricle has normal function. The left ventricle  has no regional wall motion abnormalities. Left ventricular diastolic  parameters are consistent with Grade I diastolic dysfunction (impaired relaxation).  Right ventricular systolic function is normal. The right ventricular size is normal. Tricuspid regurgitation signal is inadequate for assessing PA pressure.  Left atrial size was mildly dilated.  The mitral valve is normal in structure. No evidence of mitral valve regurgitation. No evidence of mitral stenosis.  The aortic valve is normal in structure. Aortic valve regurgitation is not visualized. No aortic stenosis is present.  There is borderline dilatation of the aortic root, measuring 39 mm. There is borderline dilatation of the ascending aorta, measuring 36 mm.  The inferior vena cava is normal in size with greater than 50% respiratory variability, suggesting right atrial pressure of 3 mmHg.  Rhythm is normal sinus   EXERCISE TOLERANCE TEST performed on 07/12/2021 Exercise capacity was normal.  Patient exercised for 5 min and 0 sec.  Maximum HR of 160 bpm. MPHR 110.0 %. Peak METS 7.0 . No ST deviation was noted. There were no arrhythmias during stress. There were no arrhythmias during recovery. ECG was interpretable and without significant changes. The ECG was negative for ischemia. Normal treadmill stress test with no evidence of arrhythmia.  MYOCARDIAL PERFUSION IMAGING STUDY (LEXISCAN) performed on 04/13/2021 Normal wall motion, EF estimated at 47%, GI uptake artifact noted No EKG changes concerning for ischemia at peak stress or in recovery. Resting and recovery EKG with frequent PVCs CT attenuation correction images with mild coronary calcification, mild aortic atherosclerosis Pharmacological myocardial perfusion imaging study with no significant  ischemia Low risk scan   Impression and Plan:  [name] [name] has been referred for pre-anesthesia review and clearance prior to him undergoing the planned anesthetic and procedural courses. Available labs, pertinent testing, and imaging results were  personally reviewed by me in preparation for upcoming operative/procedural course. [facility] medical record has been updated following extensive record review and patient interview with PAT staff.   This patient has been appropriately cleared by cardiology with an overall ACCEPTABLE risk of experiencing significant perioperative cardiovascular complications. Based on clinical review performed today (07/29/23), barring any significant acute changes in the patient's overall condition, it is anticipated that he will be able to proceed with the planned surgical intervention. Any acute changes in clinical condition may necessitate his procedure being postponed and/or cancelled. Patient will meet with anesthesia team (MD and/or CRNA) on the day of his procedure for preoperative evaluation/assessment. Questions regarding anesthetic course will be fielded at that time.   Pre-surgical instructions were reviewed with the patient during his PAT appointment, and questions were fielded to satisfaction by PAT clinical staff. He has been instructed on which medications that he will need to hold prior to surgery, as well as the ones that have been deemed safe/appropriate to take on the day of his procedure. As part of the general education provided by PAT, patient made aware both verbally and in writing, that he would need to abstain from the use of any illegal substances during his perioperative course. He was advised that failure to follow the provided instructions could necessitate case cancellation or result in serious perioperative complications up to and including death. Patient encouraged to contact PAT and/or his surgeon's office to discuss any questions or concerns that may arise prior to surgery; verbalized understanding.   Quentin Mulling, MSN, APRN, FNP-C, CEN [facility]  Perioperative Services Nurse Practitioner Phone: [phone] Fax: [phone] 07/29/23 12:41 PM  NOTE: This note  has been prepared using Scientist, clinical (histocompatibility and immunogenetics). Despite my best ability to proofread, there is always the potential that unintentional transcriptional errors may still occur from this process.

## 2023-07-30 ENCOUNTER — Encounter: Payer: Self-pay | Admitting: Surgery

## 2023-07-30 MED ORDER — ORAL CARE MOUTH RINSE: 15.0000 mL | Freq: Once | OROMUCOSAL | Status: AC

## 2023-07-30 MED ORDER — SODIUM CHLORIDE 0.9 % IV SOLN
INTRAVENOUS | Status: DC
Start: 2023-07-30 — End: 2023-07-31

## 2023-07-30 MED ORDER — TRANEXAMIC ACID-NACL 1000-0.7 MG/100ML-% IV SOLN
1000.0000 mg | INTRAVENOUS | Status: AC
  Administered 2023-07-31: 1000 mg via INTRAVENOUS

## 2023-07-30 MED ORDER — CHLORHEXIDINE GLUCONATE 0.12 % MT SOLN
15.0000 mL | Freq: Once | OROMUCOSAL | Status: AC
  Administered 2023-07-31: 15 mL via OROMUCOSAL

## 2023-07-30 MED ORDER — CEFAZOLIN SODIUM-DEXTROSE 2-4 GM/100ML-% IV SOLN
2.0000 g | INTRAVENOUS | Status: AC
  Administered 2023-07-31: 2 g via INTRAVENOUS

## 2023-07-31 ENCOUNTER — Other Ambulatory Visit: Payer: Self-pay

## 2023-07-31 ENCOUNTER — Ambulatory Visit: Payer: Medicare Other

## 2023-07-31 ENCOUNTER — Encounter: Payer: Self-pay | Admitting: Surgery

## 2023-07-31 ENCOUNTER — Ambulatory Visit: Payer: Medicare Other | Admitting: Urgent Care

## 2023-07-31 ENCOUNTER — Ambulatory Visit
Admission: RE | Admit: 2023-07-31 | Discharge: 2023-07-31 | Disposition: A | Discharge status: Home / Self Care | Payer: Medicare Other | Attending: Surgery | Admitting: Surgery

## 2023-07-31 ENCOUNTER — Encounter
Admission: RE | Disposition: A | Discharge status: Home / Self Care | Payer: Self-pay | Source: Home / Self Care | Attending: Surgery

## 2023-07-31 DIAGNOSIS — I4891 Unspecified atrial fibrillation: Secondary | ICD-10-CM | POA: Insufficient documentation

## 2023-07-31 DIAGNOSIS — F419 Anxiety disorder, unspecified: Secondary | ICD-10-CM | POA: Diagnosis not present

## 2023-07-31 DIAGNOSIS — I10 Essential (primary) hypertension: Secondary | ICD-10-CM | POA: Diagnosis not present

## 2023-07-31 DIAGNOSIS — M7581 Other shoulder lesions, right shoulder: Secondary | ICD-10-CM | POA: Diagnosis not present

## 2023-07-31 DIAGNOSIS — E119 Type 2 diabetes mellitus without complications: Secondary | ICD-10-CM

## 2023-07-31 DIAGNOSIS — E1165 Type 2 diabetes mellitus with hyperglycemia: Secondary | ICD-10-CM | POA: Insufficient documentation

## 2023-07-31 DIAGNOSIS — M19011 Primary osteoarthritis, right shoulder: Secondary | ICD-10-CM | POA: Insufficient documentation

## 2023-07-31 DIAGNOSIS — Z7984 Long term (current) use of oral hypoglycemic drugs: Secondary | ICD-10-CM | POA: Insufficient documentation

## 2023-07-31 DIAGNOSIS — I2510 Atherosclerotic heart disease of native coronary artery without angina pectoris: Secondary | ICD-10-CM | POA: Insufficient documentation

## 2023-07-31 DIAGNOSIS — M48061 Spinal stenosis, lumbar region without neurogenic claudication: Secondary | ICD-10-CM | POA: Insufficient documentation

## 2023-07-31 DIAGNOSIS — Z7901 Long term (current) use of anticoagulants: Secondary | ICD-10-CM | POA: Insufficient documentation

## 2023-07-31 DIAGNOSIS — M75101 Unspecified rotator cuff tear or rupture of right shoulder, not specified as traumatic: Secondary | ICD-10-CM | POA: Insufficient documentation

## 2023-07-31 DIAGNOSIS — Z7985 Long-term (current) use of injectable non-insulin antidiabetic drugs: Secondary | ICD-10-CM | POA: Diagnosis not present

## 2023-07-31 DIAGNOSIS — G4733 Obstructive sleep apnea (adult) (pediatric): Secondary | ICD-10-CM | POA: Insufficient documentation

## 2023-07-31 HISTORY — DX: Benign neoplasm of right adrenal gland: D35.01

## 2023-07-31 HISTORY — PX: REVERSE SHOULDER ARTHROPLASTY: SHX5054

## 2023-07-31 HISTORY — DX: Hydrocele, unspecified: N43.3

## 2023-07-31 HISTORY — DX: Atherosclerosis of aorta: I70.0

## 2023-07-31 HISTORY — DX: Hyperlipidemia, unspecified: E78.5

## 2023-07-31 HISTORY — DX: Type 2 diabetes mellitus without complications: E11.9

## 2023-07-31 HISTORY — DX: Localized enlarged lymph nodes: R59.0

## 2023-07-31 HISTORY — DX: Long term (current) use of anticoagulants: Z79.01

## 2023-07-31 HISTORY — DX: Benign neoplasm of left adrenal gland: D35.02

## 2023-07-31 HISTORY — DX: Iron deficiency anemia, unspecified: D50.9

## 2023-07-31 HISTORY — DX: Atherosclerotic heart disease of native coronary artery without angina pectoris: I25.10

## 2023-07-31 HISTORY — DX: Unilateral inguinal hernia, without obstruction or gangrene, not specified as recurrent: K40.90

## 2023-07-31 HISTORY — DX: Calculus of gallbladder without cholecystitis without obstruction: K80.20

## 2023-07-31 HISTORY — DX: Other intervertebral disc degeneration, lumbar region without mention of lumbar back pain or lower extremity pain: M51.369

## 2023-07-31 HISTORY — DX: Ventricular premature depolarization: I49.3

## 2023-07-31 HISTORY — DX: Cyst of kidney, acquired: N28.1

## 2023-07-31 HISTORY — DX: Deficiency of other specified B group vitamins: E53.8

## 2023-07-31 LAB — GLUCOSE, CAPILLARY
Glucose-Capillary: 117 mg/dL — ABNORMAL HIGH (ref 70–99)
Glucose-Capillary: 161 mg/dL — ABNORMAL HIGH (ref 70–99)

## 2023-07-31 SURGERY — ARTHROPLASTY, SHOULDER, TOTAL, REVERSE
Anesthesia: General | Site: Shoulder | Laterality: Right

## 2023-07-31 MED ORDER — METOCLOPRAMIDE HCL 5 MG/ML IJ SOLN: 5.0000 mg | Freq: Three times a day (TID) | INTRAMUSCULAR | Status: DC | PRN

## 2023-07-31 MED ORDER — ROCURONIUM BROMIDE 10 MG/ML (PF) SYRINGE
PREFILLED_SYRINGE | INTRAVENOUS | Status: AC
  Filled 2023-07-31: qty 10

## 2023-07-31 MED ORDER — BUPIVACAINE-EPINEPHRINE (PF) 0.5% -1:200000 IJ SOLN
INTRAMUSCULAR | Status: AC
  Filled 2023-07-31: qty 30

## 2023-07-31 MED ORDER — DEXAMETHASONE SODIUM PHOSPHATE 10 MG/ML IJ SOLN
INTRAMUSCULAR | Status: DC | PRN
  Administered 2023-07-31: 8 mg via INTRAVENOUS

## 2023-07-31 MED ORDER — LIDOCAINE HCL (PF) 1 % IJ SOLN
INTRAMUSCULAR | Status: AC
  Filled 2023-07-31: qty 5

## 2023-07-31 MED ORDER — SODIUM CHLORIDE 0.9 % IR SOLN
Status: DC | PRN
  Administered 2023-07-31: 3000 mL

## 2023-07-31 MED ORDER — CEFAZOLIN SODIUM-DEXTROSE 2-4 GM/100ML-% IV SOLN
2.0000 g | Freq: Four times a day (QID) | INTRAVENOUS | Status: DC
  Administered 2023-07-31: 2 g via INTRAVENOUS

## 2023-07-31 MED ORDER — LIDOCAINE HCL (PF) 1 % IJ SOLN
INTRAMUSCULAR | Status: DC | PRN
  Administered 2023-07-31: 4 mL via SUBCUTANEOUS

## 2023-07-31 MED ORDER — GEMTESA 75 MG PO TABS: 75.0000 mg | ORAL_TABLET | Freq: Every evening | ORAL | Status: DC

## 2023-07-31 MED ORDER — MUPIROCIN 2 % EX OINT: 1.0000 | TOPICAL_OINTMENT | Freq: Two times a day (BID) | CUTANEOUS | 0 refills | Status: AC

## 2023-07-31 MED ORDER — PHENYLEPHRINE HCL-NACL 20-0.9 MG/250ML-% IV SOLN
INTRAVENOUS | Status: DC | PRN
  Administered 2023-07-31: 30 ug/min via INTRAVENOUS

## 2023-07-31 MED ORDER — ACETAMINOPHEN 325 MG PO TABS: 325.0000 mg | ORAL_TABLET | Freq: Four times a day (QID) | ORAL | Status: DC | PRN

## 2023-07-31 MED ORDER — ACETAMINOPHEN 10 MG/ML IV SOLN
INTRAVENOUS | Status: DC | PRN
  Administered 2023-07-31: 1000 mg via INTRAVENOUS

## 2023-07-31 MED ORDER — OXYCODONE HCL 5 MG PO TABS: 5.0000 mg | ORAL_TABLET | ORAL | Status: DC | PRN

## 2023-07-31 MED ORDER — MIDAZOLAM HCL 2 MG/2ML IJ SOLN
INTRAMUSCULAR | Status: AC
  Filled 2023-07-31: qty 2

## 2023-07-31 MED ORDER — CHLORHEXIDINE GLUCONATE 0.12 % MT SOLN
OROMUCOSAL | Status: AC
  Filled 2023-07-31: qty 15

## 2023-07-31 MED ORDER — CEFAZOLIN SODIUM-DEXTROSE 2-4 GM/100ML-% IV SOLN
INTRAVENOUS | Status: AC
  Filled 2023-07-31: qty 100

## 2023-07-31 MED ORDER — LIDOCAINE HCL (PF) 2 % IJ SOLN
INTRAMUSCULAR | Status: AC
  Filled 2023-07-31: qty 5

## 2023-07-31 MED ORDER — FENTANYL CITRATE PF 50 MCG/ML IJ SOSY
50.0000 ug | PREFILLED_SYRINGE | Freq: Once | INTRAMUSCULAR | Status: AC
  Administered 2023-07-31: 50 ug via INTRAVENOUS

## 2023-07-31 MED ORDER — BUPIVACAINE LIPOSOME 1.3 % IJ SUSP
INTRAMUSCULAR | Status: AC
  Filled 2023-07-31: qty 20

## 2023-07-31 MED ORDER — FENTANYL CITRATE (PF) 100 MCG/2ML IJ SOLN: 25.0000 ug | INTRAMUSCULAR | Status: DC | PRN

## 2023-07-31 MED ORDER — ACETAMINOPHEN 10 MG/ML IV SOLN
INTRAVENOUS | Status: AC
  Filled 2023-07-31: qty 100

## 2023-07-31 MED ORDER — BUPIVACAINE-EPINEPHRINE (PF) 0.5% -1:200000 IJ SOLN
INTRAMUSCULAR | Status: DC | PRN
  Administered 2023-07-31: 30 mL

## 2023-07-31 MED ORDER — ONDANSETRON HCL 4 MG/2ML IJ SOLN
INTRAMUSCULAR | Status: DC | PRN
  Administered 2023-07-31: 4 mg via INTRAVENOUS

## 2023-07-31 MED ORDER — SODIUM CHLORIDE 0.9 % IV SOLN: INTRAVENOUS | Status: DC

## 2023-07-31 MED ORDER — ONDANSETRON HCL 4 MG/2ML IJ SOLN
4.0000 mg | Freq: Four times a day (QID) | INTRAMUSCULAR | Status: DC | PRN
Start: 2023-07-31 — End: 2023-07-31

## 2023-07-31 MED ORDER — LIDOCAINE HCL (CARDIAC) PF 100 MG/5ML IV SOSY
PREFILLED_SYRINGE | INTRAVENOUS | Status: DC | PRN
  Administered 2023-07-31: 100 mg via INTRAVENOUS

## 2023-07-31 MED ORDER — DROPERIDOL 2.5 MG/ML IJ SOLN: 0.6250 mg | Freq: Once | INTRAMUSCULAR | Status: DC | PRN

## 2023-07-31 MED ORDER — FENTANYL CITRATE (PF) 100 MCG/2ML IJ SOLN
INTRAMUSCULAR | Status: DC | PRN
  Administered 2023-07-31 (×2): 50 ug via INTRAVENOUS

## 2023-07-31 MED ORDER — ONDANSETRON HCL 4 MG PO TABS: 4.0000 mg | ORAL_TABLET | Freq: Four times a day (QID) | ORAL | Status: DC | PRN

## 2023-07-31 MED ORDER — BUPIVACAINE HCL (PF) 0.5 % IJ SOLN
INTRAMUSCULAR | Status: AC
  Filled 2023-07-31: qty 10

## 2023-07-31 MED ORDER — FENTANYL CITRATE PF 50 MCG/ML IJ SOSY
PREFILLED_SYRINGE | INTRAMUSCULAR | Status: AC
  Filled 2023-07-31: qty 1

## 2023-07-31 MED ORDER — KETOROLAC TROMETHAMINE 15 MG/ML IJ SOLN: 15.0000 mg | Freq: Once | INTRAMUSCULAR | Status: DC

## 2023-07-31 MED ORDER — METOCLOPRAMIDE HCL 10 MG PO TABS: 5.0000 mg | ORAL_TABLET | Freq: Three times a day (TID) | ORAL | Status: DC | PRN

## 2023-07-31 MED ORDER — DEXAMETHASONE SODIUM PHOSPHATE 10 MG/ML IJ SOLN
INTRAMUSCULAR | Status: AC
  Filled 2023-07-31: qty 1

## 2023-07-31 MED ORDER — BUPIVACAINE HCL (PF) 0.5 % IJ SOLN
INTRAMUSCULAR | Status: DC | PRN
  Administered 2023-07-31: 10 mL via PERINEURAL

## 2023-07-31 MED ORDER — OXYCODONE HCL 5 MG PO TABS: 5.0000 mg | ORAL_TABLET | ORAL | 0 refills | Status: DC | PRN

## 2023-07-31 MED ORDER — BUPIVACAINE LIPOSOME 1.3 % IJ SUSP
INTRAMUSCULAR | Status: DC | PRN
  Administered 2023-07-31: 20 mL via PERINEURAL

## 2023-07-31 MED ORDER — ONDANSETRON HCL 4 MG/2ML IJ SOLN
INTRAMUSCULAR | Status: AC
  Filled 2023-07-31: qty 2

## 2023-07-31 MED ORDER — PROPOFOL 10 MG/ML IV BOLUS
INTRAVENOUS | Status: DC | PRN
  Administered 2023-07-31: 100 mg via INTRAVENOUS

## 2023-07-31 MED ORDER — PROPOFOL 10 MG/ML IV BOLUS
INTRAVENOUS | Status: AC
  Filled 2023-07-31: qty 20

## 2023-07-31 MED ORDER — SUGAMMADEX SODIUM 200 MG/2ML IV SOLN
INTRAVENOUS | Status: DC | PRN
  Administered 2023-07-31: 200 mg via INTRAVENOUS

## 2023-07-31 MED ORDER — ROCURONIUM BROMIDE 100 MG/10ML IV SOLN
INTRAVENOUS | Status: DC | PRN
  Administered 2023-07-31: 20 mg via INTRAVENOUS
  Administered 2023-07-31: 10 mg via INTRAVENOUS
  Administered 2023-07-31: 60 mg via INTRAVENOUS

## 2023-07-31 MED ORDER — FENTANYL CITRATE (PF) 100 MCG/2ML IJ SOLN
INTRAMUSCULAR | Status: AC
  Filled 2023-07-31: qty 2

## 2023-07-31 MED ORDER — TRANEXAMIC ACID-NACL 1000-0.7 MG/100ML-% IV SOLN
INTRAVENOUS | Status: AC
  Filled 2023-07-31: qty 100

## 2023-07-31 MED ORDER — CHLORHEXIDINE GLUCONATE 4 % EX SOLN: 1.0000 | CUTANEOUS | 1 refills | Status: DC

## 2023-07-31 MED ORDER — MIDAZOLAM HCL 2 MG/2ML IJ SOLN
1.0000 mg | INTRAMUSCULAR | Status: DC | PRN
  Administered 2023-07-31: 1 mg via INTRAVENOUS

## 2023-07-31 SURGICAL SUPPLY — 70 items
BEARING HUMERAL 40 STD VITE (Joint) IMPLANT
BIT DRILL 2.7 W/STOP DISP (BIT) IMPLANT
BIT DRILL TWIST 2.7 (BIT) IMPLANT
BLADE CLIPPER SURG (BLADE) IMPLANT
BLADE SAW SAG 25X90X1.19 (BLADE) ×1 IMPLANT
CHLORAPREP W/TINT 26 (MISCELLANEOUS) ×1 IMPLANT
COMP REV AUG LG W/TAPER/GLENOI (Joint) ×1 IMPLANT
COMPONENT RV AUG LG W/TAPR/GLN (Joint) IMPLANT
COOLER POLAR GLACIER W/PUMP (MISCELLANEOUS) ×1 IMPLANT
DRAPE INCISE IOBAN 66X45 STRL (DRAPES) ×1 IMPLANT
DRAPE SHEET LG 3/4 BI-LAMINATE (DRAPES) ×1 IMPLANT
DRAPE TABLE BACK 80X90 (DRAPES) ×1 IMPLANT
DRSG OPSITE POSTOP 4X8 (GAUZE/BANDAGES/DRESSINGS) ×1 IMPLANT
ELECT BLADE 6.5 EXT (BLADE) IMPLANT
ELECT CAUTERY BLADE 6.4 (BLADE) ×1 IMPLANT
ELECT REM PT RETURN 9FT ADLT (ELECTROSURGICAL) ×1
ELECTRODE REM PT RTRN 9FT ADLT (ELECTROSURGICAL) ×1 IMPLANT
GAUZE XEROFORM 1X8 LF (GAUZE/BANDAGES/DRESSINGS) ×1 IMPLANT
GLENOSPHERE VERSADIAL 40 +6 (Joint) IMPLANT
GLOVE BIO SURGEON STRL SZ7.5 (GLOVE) ×4 IMPLANT
GLOVE BIO SURGEON STRL SZ8 (GLOVE) ×4 IMPLANT
GLOVE BIOGEL PI IND STRL 8 (GLOVE) ×2 IMPLANT
GLOVE INDICATOR 8.0 STRL GRN (GLOVE) ×1 IMPLANT
GOWN STRL REUS W/ TWL LRG LVL3 (GOWN DISPOSABLE) ×2 IMPLANT
GOWN STRL REUS W/ TWL XL LVL3 (GOWN DISPOSABLE) ×1 IMPLANT
GUIDE BONE RSA SHLD ROT RT (ORTHOPEDIC DISPOSABLE SUPPLIES) IMPLANT
HANDLE YANKAUER SUCT OPEN TIP (MISCELLANEOUS) ×1 IMPLANT
HOOD PEEL AWAY T7 (MISCELLANEOUS) ×3 IMPLANT
IV NS IRRIG 3000ML ARTHROMATIC (IV SOLUTION) ×1 IMPLANT
KIT STABILIZATION SHOULDER (MISCELLANEOUS) ×1 IMPLANT
KIT TURNOVER KIT A (KITS) ×1 IMPLANT
MANIFOLD NEPTUNE II (INSTRUMENTS) ×1 IMPLANT
MASK FACE SPIDER DISP (MASK) ×1 IMPLANT
MAT ABSORB FLUID 56X50 GRAY (MISCELLANEOUS) ×1 IMPLANT
NDL MAYO CATGUT SZ1 (NEEDLE) IMPLANT
NDL SAFETY ECLIPSE 18X1.5 (NEEDLE) ×1 IMPLANT
NDL SPNL 20GX3.5 QUINCKE YW (NEEDLE) ×1 IMPLANT
NEEDLE MAYO CATGUT SZ1 (NEEDLE)
NEEDLE SPNL 20GX3.5 QUINCKE YW (NEEDLE) ×1
PACK ARTHROSCOPY SHOULDER (MISCELLANEOUS) ×1 IMPLANT
PAD ARMBOARD 7.5X6 YLW CONV (MISCELLANEOUS) ×1 IMPLANT
PAD WRAPON POLAR SHDR UNIV (MISCELLANEOUS) ×1 IMPLANT
PIN THREADED REVERSE (PIN) IMPLANT
PULSAVAC PLUS IRRIG FAN TIP (DISPOSABLE) ×1
REAMER GUIDE BUSHING SURG DISP (MISCELLANEOUS) IMPLANT
REAMER GUIDE W/SCREW AUG (MISCELLANEOUS) IMPLANT
SCREW BONE CORT 6.5X35MM (Screw) IMPLANT
SCREW BONE LOCKING 4.75X30X3.5 (Screw) IMPLANT
SCREW BONE LOCKING 4.75X35X3.5 (Screw) IMPLANT
SCREW LOCKING 4.75MMX15MM (Screw) IMPLANT
SCREW LOCKING NS 4.75MMX20MM (Screw) IMPLANT
SLING ULTRA II LG (MISCELLANEOUS) IMPLANT
SLING ULTRA II M (MISCELLANEOUS) IMPLANT
SPONGE T-LAP 18X18 ~~LOC~~+RFID (SPONGE) ×2 IMPLANT
STAPLER SKIN PROX 35W (STAPLE) ×1 IMPLANT
STEM HUMERAL MICRO SZ18 (Stem) IMPLANT
SUT ETHIBOND 0 MO6 C/R (SUTURE) ×1 IMPLANT
SUT FIBERWIRE #2 38 BLUE 1/2 (SUTURE) ×4
SUT MAXBRAID #2 CVD NDL (SUTURE) IMPLANT
SUT VIC AB 0 CT1 36 (SUTURE) ×1 IMPLANT
SUT VIC AB 2-0 CT1 TAPERPNT 27 (SUTURE) ×2 IMPLANT
SUTURE FIBERWR #2 38 BLUE 1/2 (SUTURE) ×4 IMPLANT
SYR 10ML LL (SYRINGE) ×1 IMPLANT
SYR 30ML LL (SYRINGE) ×1 IMPLANT
SYR TOOMEY 50ML (SYRINGE) ×1 IMPLANT
TIP FAN IRRIG PULSAVAC PLUS (DISPOSABLE) ×1 IMPLANT
TRAP FLUID SMOKE EVACUATOR (MISCELLANEOUS) ×1 IMPLANT
TRAY HUMERAL NEUTRAL EXT 6 (Shoulder) IMPLANT
WATER STERILE IRR 500ML POUR (IV SOLUTION) ×1 IMPLANT
WRAPON POLAR PAD SHDR UNIV (MISCELLANEOUS) ×1

## 2023-07-31 NOTE — Anesthesia Preprocedure Evaluation (Signed)
Anesthesia Evaluation  Patient identified by MRN, date of birth, ID band Patient awake    Reviewed: Allergy & Precautions, NPO status , Patient's Chart, lab work & pertinent test results  History of Anesthesia Complications Negative for: history of anesthetic complications  Airway Mallampati: III  TM Distance: >3 FB Neck ROM: Full    Dental  (+) Caps, Dental Advidsory Given   Pulmonary shortness of breath and with exertion, sleep apnea and Continuous Positive Airway Pressure Ventilation , neg COPD, neg recent URI   breath sounds clear to auscultation- rhonchi (-) wheezing      Cardiovascular Exercise Tolerance: Good hypertension, Pt. on medications (-) angina (-) CAD, (-) Past MI, (-) Cardiac Stents and (-) CABG + dysrhythmias Atrial Fibrillation  Rhythm:Regular Rate:Normal - Systolic murmurs and - Diastolic murmurs    Neuro/Psych neg Seizures PSYCHIATRIC DISORDERS Anxiety     negative neurological ROS     GI/Hepatic negative GI ROS, Neg liver ROS,,,  Endo/Other  diabetes, Oral Hypoglycemic Agents    Renal/GU negative Renal ROS     Musculoskeletal  (+) Arthritis ,    Abdominal  (+) + obese  Peds  Hematology  (+) Blood dyscrasia, anemia   Anesthesia Other Findings Past Medical History: No date: Anemia     Comment:  iron deficiency and b12 deficiency.  followed by               oncology No date: Anxiety No date: Arthritis 12/28/2020: Atrial fibrillation (HCC) No date: BPH (benign prostatic hyperplasia) No date: Diabetes mellitus No date: Diverticulosis No date: Elevated platelet count No date: Hemorrhoids No date: HOH (hard of hearing)     Comment:  wears hearing aides No date: Hypertension No date: OSA on CPAP No date: Prostate disorder   Reproductive/Obstetrics                             Anesthesia Physical Anesthesia Plan  ASA: 3  Anesthesia Plan: General   Post-op Pain  Management: Regional block*   Induction: Intravenous  PONV Risk Score and Plan: 1 and Ondansetron, Dexamethasone and Treatment may vary due to age or medical condition  Airway Management Planned: Oral ETT  Additional Equipment:   Intra-op Plan:   Post-operative Plan: Extubation in OR  Informed Consent: I have reviewed the patients History and Physical, chart, labs and discussed the procedure including the risks, benefits and alternatives for the proposed anesthesia with the patient or authorized representative who has indicated his/her understanding and acceptance.     Dental advisory given  Plan Discussed with: CRNA and Anesthesiologist  Anesthesia Plan Comments:         Anesthesia Quick Evaluation

## 2023-07-31 NOTE — Discharge Instructions (Addendum)
Orthopedic discharge instructions: May shower with intact OpSite dressing once nerve block has worn off (around Monday).  Apply ice frequently to shoulder or use Polar Care device. Take oxycodone as prescribed when needed.  May supplement with ES Tylenol if necessary. Resume Eliquis tomorrow as prescribed. Keep shoulder immobilizer on at all times except may remove for bathing purposes. Follow-up in 10-14 days or as scheduled.   [facility].  [address] , [place], [place], [address]. [phone] They will call you to arrange when they can come to see you

## 2023-07-31 NOTE — Transfer of Care (Signed)
Immediate Anesthesia Transfer of Care Note  Patient: [name] [name]  Procedure(s) Performed: REVERSE SHOULDER ARTHROPLASTY (Right: Shoulder)  Patient Location: PACU  Anesthesia Type:General  Level of Consciousness: awake, alert , and oriented  Airway & Oxygen Therapy: Patient Spontanous Breathing  Post-op Assessment: Report given to RN and Post -op Vital signs reviewed and stable  Post vital signs: Reviewed and stable  Last Vitals:  Vitals Value Taken Time  BP 129/74 07/31/23 1312  Temp 35.8 1312  Pulse 93 07/31/23 1316  Resp 14 07/31/23 1317  SpO2 94 % 07/31/23 1316  Vitals shown include unfiled device data.  Last Pain:  Vitals:   07/31/23 0902  TempSrc: Temporal  PainSc: 0-No pain         Complications: No notable events documented.

## 2023-07-31 NOTE — Evaluation (Signed)
Occupational Therapy Evaluation Patient Details Name: [name] [name] MRN: [id number] DOB: 04-16-1946 Today's Date: 07/31/2023   History of Present Illness [name] [name] is a 78 y.o. who s/p reverse right total shoulder arthroplasty on 07/31/2023.   Clinical Impression   [name] [name] was seen for OT evaluation this date. Prior to hospital admission, pt was IND. Pt lives with spouse. Pt currently requires MAX A don sling, shirt, and polar care. SUPERVISION for ADL t/f and navigating x4 steps. Pt instructed in polar care mgt, compression stockings mgt, sling/immobilizer mgt, ROM exercises for RUE (with instructions for no shoulder exercises until full sensation has returned), RUE precautions, adaptive strategies for bathing/dressing, and positioning for sleep. All education complete, will sign off. Upon hospital discharge, recommend no OT follow up.    If plan is discharge home, recommend the following: A little help with walking and/or transfers;A lot of help with bathing/dressing/bathroom    Functional Status Assessment  Patient has had a recent decline in their functional status and demonstrates the ability to make significant improvements in function in a reasonable and predictable amount of time.  Equipment Recommendations  None recommended by OT    Recommendations for Other Services       Precautions / Restrictions Precautions Precautions: Shoulder Shoulder Interventions: Shoulder sling/immobilizer;Shoulder abduction pillow;Off for dressing/bathing/exercises Required Braces or Orthoses: Sling Restrictions Weight Bearing Restrictions Per Provider Order: Yes RUE Weight Bearing Per Provider Order: Non weight bearing      Mobility Bed Mobility               General bed mobility comments: not tested    Transfers Overall transfer level: Needs assistance Equipment used: None Transfers: Sit to/from Stand Sit to Stand: Supervision                  Balance Overall balance  assessment: No apparent balance deficits (not formally assessed)                                         ADL either performed or assessed with clinical judgement   ADL Overall ADL's : Needs assistance/impaired                                       General ADL Comments: MAX A don sling, shirt, and polar care      Pertinent Vitals/Pain Pain Assessment Pain Assessment: No/denies pain     Extremity/Trunk Assessment Upper Extremity Assessment Upper Extremity Assessment: RUE deficits/detail RUE: Unable to fully assess due to immobilization   Lower Extremity Assessment Lower Extremity Assessment: Overall WFL for tasks assessed       Communication Communication Communication: No apparent difficulties   Cognition Arousal: Alert Behavior During Therapy: WFL for tasks assessed/performed Overall Cognitive Status: Within Functional Limits for tasks assessed                                                  Home Living Family/patient expects to be discharged to:: Private residence Living Arrangements: Spouse/significant other Available Help at Discharge: Family;Available 24 hours/day Type of Home: House Home Access: Stairs to enter [employer] of Steps: 1 Entrance Stairs-Rails: Can reach both  Home Layout: Two level;Laundry or work area in basement;Able to live on main level with bedroom/bathroom               Home Equipment: Gilmer Mor - single point          Prior Functioning/Environment Prior Level of Function : Independent/Modified Independent             Mobility Comments: PRN use of SPC          OT Problem List: Decreased range of motion;Decreased activity tolerance;Impaired balance (sitting and/or standing);Decreased safety awareness      OT Treatment/Interventions:      OT Goals(Current goals can be found in the care plan section) Acute Rehab OT Goals Patient Stated Goal: go home OT Goal  Formulation: With patient/family Time For Goal Achievement: 08/14/23 Potential to Achieve Goals: Good  OT Frequency:      Co-evaluation              AM-PAC OT "6 Clicks" Daily Activity     Outcome Measure Help from another person eating meals?: None Help from another person taking care of personal grooming?: None Help from another person toileting, which includes using toliet, bedpan, or urinal?: A Little Help from another person bathing (including washing, rinsing, drying)?: A Little Help from another person to put on and taking off regular upper body clothing?: A Lot Help from another person to put on and taking off regular lower body clothing?: A Little 6 Click Score: 19   End of Session    Activity Tolerance: Patient tolerated treatment well Patient left: in chair;with call bell/phone within reach  OT Visit Diagnosis: Unsteadiness on feet (R26.81)                Time: 0981-1914 OT Time Calculation (min): 46 min Charges:  OT General Charges $OT Visit: 1 Visit OT Evaluation $OT Eval Low Complexity: 1 Low OT Treatments $Self Care/Home Management : 23-37 mins  Kathie Dike, M.S. OTR/L  07/31/23, 3:39 PM  ascom [phone]

## 2023-07-31 NOTE — TOC Progression Note (Signed)
Transition of Care [facility]) - Progression Note    Patient Details  Name: [name] [name] MRN: [id number] Date of Birth: Oct 31, 1945  Transition of Care [facility]) CM/SW Contact  Marlowe Sax, RN Phone Number: 07/31/2023, 11:25 AM  Clinical Narrative:                                       [facility]  Is set up for [facility] prior to surgery by Surgeons office         Expected Discharge Plan and Services                                               Social Determinants of Health (SDOH) Interventions SDOH Screenings   Food Insecurity: No Food Insecurity (08/26/2022)   Received from [facility], [facility]  Housing: Unknown (07/28/2023)   Received from [facility]  Transportation Needs: No Transportation Needs (08/26/2022)   Received from [facility], [facility]  Utilities: Not At Risk (08/26/2022)   Received from [facility], [facility]  Financial Resource Strain: Low Risk  (08/26/2022)   Received from [facility], [facility]  Tobacco Use: Low Risk  (07/31/2023)    Readmission Risk Interventions     No data to display

## 2023-07-31 NOTE — Anesthesia Procedure Notes (Signed)
Anesthesia Regional Block: Interscalene brachial plexus block   Pre-Anesthetic Checklist: , timeout performed,  Correct Patient, Correct Site, Correct Laterality,  Correct Procedure, Correct Position, site marked,  Risks and benefits discussed,  Surgical consent,  Pre-op evaluation,  At surgeon's request and post-op pain management  Laterality: Right and Upper  Prep: chloraprep       Needles:  Injection technique: Single-shot  Needle Type: Stimiplex     Needle Length: 5cm  Needle Gauge: 22     Additional Needles:   Procedures:,,,, ultrasound used (permanent image in chart),,    Narrative:  Start time: 07/31/2023 9:51 AM End time: 07/31/2023 9:53 AM Injection made incrementally with aspirations every 5 mL.  Performed by: Personally  Anesthesiologist: Lenard Simmer, MD  Additional Notes: Functioning IV was confirmed and monitors were applied.  A 50mm 22ga Stimuplex needle was used. Sterile prep and drape,hand hygiene and sterile gloves were used.  Negative aspiration and negative test dose prior to incremental administration of local anesthetic. The patient tolerated the procedure well.

## 2023-07-31 NOTE — Anesthesia Procedure Notes (Signed)
Procedure Name: Intubation Date/Time: 07/31/2023 10:49 AM  Performed by: Morene Crocker, CRNAPre-anesthesia Checklist: Patient identified, Patient being monitored, Timeout performed, Emergency Drugs available and Suction available Patient Re-evaluated:Patient Re-evaluated prior to induction Oxygen Delivery Method: Circle system utilized Preoxygenation: Pre-oxygenation with 100% oxygen Induction Type: IV induction Ventilation: Mask ventilation without difficulty and Oral airway inserted - appropriate to patient size Laryngoscope Size: 3 and McGrath Grade View: Grade I Tube type: Oral Tube size: 7.5 mm Number of attempts: 1 Airway Equipment and Method: Stylet Placement Confirmation: ETT inserted through vocal cords under direct vision, positive ETCO2 and breath sounds checked- equal and bilateral Secured at: 22 cm Tube secured with: Tape Dental Injury: Teeth and Oropharynx as per pre-operative assessment  Comments: Smooth atraumatic intubation, no complications noted.

## 2023-07-31 NOTE — H&P (Signed)
History of Present Illness: [name] [name] is a 78 y.o. who presents today for history and physical. He is undergoing a reverse right total shoulder arthroplasty on 07/31/2023. Since his last visit at the clinic there is been no improvement in his condition. The patient expresses his desire to proceed with surgery.  The patient's symptoms began several years ago and developed without any specific cause or injury . 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 His symptoms have worsened over the past 6 months. He was working with Dr. Mariah Milling of physiatry for his significant lumbar stenosis. She tried several steroid injections which provided little relief in his symptoms, prompting him to make this appointment. The patient describes the symptoms as marked (major pain with significant limitations) and have the quality of being aching, miserable, nagging, sharp, stabbing, tender, and throbbing. The pain is localized to the lateral arm/shoulder and localized to the anterior shoulder. These symptoms are aggravated constantly, with normal daily activities, with sleeping, at higher levels of activity, with overhead activity, and reaching behind the back. He has tried acetaminophen with very partial relief of his symptoms. He has tried rest with no substantial relief. He has tried several injections noted above, also with only limited benefit. The patient denies any neck pain, noticing no new numbness or paresthesias down his arm to his hand. He is right-hand dominant.  Patient has had his preop already. Labs show a WBC of 13. Patient is somewhat concerned about the elevated WBC. He has had a staph infection during one of his total knee arthroplasties but was not MRSA. Denies any chest pain, shortness of breath or urinary issues. States that he feels fine. I  did discuss this with Dr. Joice Lofts.  Past Medical History: Anemia 06/08/2015  Post wu and ? Iron, colon due 2020  Anxiety states  Benign non-nodular prostatic hyperplasia with lower urinary tract symptoms 06/08/2015  Diabetes mellitus type 2, controlled (CMS/HHS-HCC) 06/08/2015  History of blood transfusion  Hypertension goal BP (blood pressure) < 140/90 06/08/2015  Left arm is higher  OSA on CPAP 05/09/2020  Primary insomnia 06/08/2015  Needing prn ambien  Primary osteoarthritis of right hip 10/15/2020  Pure hypercholesterolemia   Past Surgical History: Right total knee arthroplasty using computer-assisted navigation 07/27/2012 (Dr Ernest Pine)  Left total knee arthroplasty using computer-assisted navigation 10/26/2012 (Dr Ernest Pine)  EGD 03/17/2019 (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)  Left total hip arthroplasty 02/23/2020 (Dr Ernest Pine)  Right total hip arthroplasty 01/10/2021 (Dr Ernest Pine)  CATARACT EXTRACTION  Cosmetic face surgery for scar  KNEE ARTHROSCOPY  PROSTATE SURGERY  urolift   Past Family History: Diabetes type II Mother  Hip fracture Mother  Alzheimer's disease Mother   Medications: acetaminophen (TYLENOL) 650 MG ER tablet Take 1,300 mg by mouth every 8 (eight) hours as needed  ALPRAZolam (XANAX) 0.5 MG tablet TAKE ONE TABLET TWICE A DAY AS NEEDED FOR SLEEP 60 tablet 3  amLODIPine (NORVASC) 5 MG tablet Take 1 tablet (5 mg total) by mouth once daily 90 tablet 3  amoxicillin (AMOXIL) 500 MG capsule 500 mg 4 TABLETS ONE ONE PRIOR TO DENTAL PROCEDURE  apixaban (ELIQUIS) 5 mg tablet Take 5 mg by mouth 2 (two) times daily  atorvastatin (LIPITOR) 40 MG tablet Take 1 tablet by mouth once daily  benazepril-hydrochlorthiazide (LOTENSIN HCT) 20-12.5 mg tablet take 1  tablet by mouth daily 100 tablet 3  blood-glucose sensor (DEXCOM G7 SENSOR) Devi Use 1 each every 10 (ten) days 9 each 3  cyanocobalamin, vitamin B-12, 5,000 mcg TbDL Take 10,000 mcg by mouth once  daily  empagliflozin (JARDIANCE) 25 mg tablet Take 1 tablet (25 mg total) by mouth once daily 90 tablet 3  ferrous sulfate 325 (65 FE) MG EC tablet Take 325 mg by mouth once daily  flecainide (TAMBOCOR) 50 MG tablet Take 1 tablet by mouth 2 (two) times daily  GEMTESA 75 mg Tab Take 75 mg by mouth once daily  glimepiride (AMARYL) 1 MG tablet TAKE 1 TABLET BY MOUTH EVERY MORNING WITH BREAKFAST. 90 tablet 1  metFORMIN (GLUCOPHAGE-XR) 500 MG XR tablet Take 2 tablets (1,000 mg total) by mouth 2 (two) times daily with meals 400 tablet 3  metoprolol succinate (TOPROL-XL) 25 MG XL tablet Take 1 tablet by mouth once daily  naphazoline-glycerin 0.012-0.25 % Drop Apply 1-2 drops to eye 4 (four) times daily as needed ((dry/irritated eyes.).)  OZEMPIC 2 mg/dose (8 mg/3 mL) pen injector INJECT 2 MG TOTAL SUBCUTANEOUSLY ONCE A WEEK 9 mL 1  sertraline (ZOLOFT) 50 MG tablet TAKE ONE TABLET EVERY DAY 90 tablet 1  silodosin (RAPAFLO) 8 mg capsule Take 1 capsule by mouth daily with breakfast  traMADoL (ULTRAM) 50 mg tablet TAKE 1 TABLET BY MOUTH EVERY 8 HOURS AS NEEDED FOR PAIN 30 tablet 0  vit B-comp w-Fe,Ca,FA1mg  (IRON-VITAMINS ORAL) Take 1 oz by mouth once daily  zinc gluconate 50 mg tablet Take 50 mg by mouth once daily   Allergies: No Known Allergies   Review of Systems: A comprehensive 14 point ROS was performed, reviewed, and the pertinent orthopaedic findings are documented in the HPI.  Physical Exam: BP 132/80 (BP Location: Left upper arm, Patient Position: Sitting, BP Cuff Size: Adult)  Ht 177.8 cm (5\' 10" )  Wt (!) 117.3 kg (258 lb 9.6 oz)  BMI 37.11 kg/m   General: Well-developed well-nourished male seen in no acute distress.   HEENT: Atraumatic,normocephalic. Pupils are equal and reactive to light. Oropharynx is clear with moist mucosa  Lungs: Clear to auscultation bilaterally   Cardiovascular: Regular rate and rhythm. Normal S1, S2. No murmurs. No appreciable gallops or rubs.  Peripheral pulses are palpable.  Abdomen: Soft, non-tender, nondistended. Bowel sounds present  Right shoulder exam: SKIN: normal SWELLING: none WARMTH: none LYMPH NODES: no adenopathy palpable CREPITUS: Mild-moderate glenohumeral crepitance TENDERNESS: Mildly tender over anterolateral shoulder ROM (active):  Forward flexion: 155 degrees Abduction: 140 degrees Internal rotation: Right PSIS ROM (passive):  Forward flexion: 160 degrees Abduction: 150 degrees ER/IR at 90 abd: 80 degrees / 55 degrees  He has mild-moderate pain and crepitance with all motions.  STRENGTH: Forward flexion: 4-4+/5 Abduction: 4-4+/5 External rotation: 4-4+/5 Internal rotation: 4+/5 Pain with RC testing: Mild pain with resisted forward flexion and abduction  STABILITY: Normal  SPECIAL TESTS: Juanetta Gosling' test: positive, mild Speed's test: negative Capsulitis - pain w/ passive ER: no Crossed arm test: Normally positive Crank: Not evaluated Anterior apprehension: Negative Posterior apprehension: Not evaluated  Neurological: The patient is alert and oriented Sensation to light touch appears to be intact and within normal limits Gross motor strength appeared to be equal to 5/5  Vascular : Peripheral pulses felt to be palpable. Capillary refill appears to be intact and within normal limits  X-rays: X-rays taken on 05/12/2023 at [facility] clinic demonstrate significant degenerative changes with complete loss of the glenohumeral joint space. The subacromial space  is markedly decreased. There is no subacromial or infra-clavicular spurring. He demonstrates a Type II acromion.   Impression: 1. Advanced degenerative joint disease, right shoulder. 2. Rotator cuff tendinitis, right shoulder.  Plan:  The treatment options were discussed with the patient. In addition, patient educational materials were provided regarding the diagnosis and treatment options. The patient is quite frustrated by his continued  symptoms and functional limitations, and is ready to consider more aggressive treatment options. Therefore, I have recommended a surgical procedure, specifically a reverse right total shoulder arthroplasty. The procedure was discussed with the patient, as were the potential risks (including bleeding, infection, nerve and/or blood vessel injury, persistent or recurrent pain, stiffness of the shoulder, dislocation, loosening of and/or failure of the components, need for further surgery, blood clots, strokes, heart attacks and/or arhythmias, pneumonia, etc.) and benefits. The patient states his understanding and wishes to proceed. All of the patient's questions and concerns were answered. She can call any time with further concerns. He will follow up post-surgery, routine.     H&P reviewed and patient re-examined. No changes.

## 2023-07-31 NOTE — Op Note (Signed)
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  Post-Op Diagnosis:   Same  Procedure:   Reverse right total shoulder arthroplasty with biceps tenodesis using the Signature guided system.  Surgeon:   Maryagnes Amos, MD  Assistant:   Horris Latino, PA-C; Juanetta Snow, PA-S  Anesthesia:   General endotracheal with an interscalene block using Exparel placed preoperatively by the anesthesiologist.  Findings:   As above.  Complications:   None  EBL:   50 cc  Fluids:   550 cc crystalloid  UOP:   None  TT:   None  Drains:   None  Closure:   Staples  Implants:   All press-fit [employer] Comprehensive system with an 18 mm identity micro-humeral stem, a -6 mm laterally offset 40 mm Identity humeral tray with a +3 mm insert, and a large augmented mini-base plate with a 40 mm +6 mm lateralized glenosphere.  Brief Clinical Note:   The patient is a 78 year old male with a [name] history of progressively worsening pain and weakness of the right shoulder. The patient's symptoms have progressed despite medications, activity modification, etc. The patient's history and examination are consistent with a massive irreparable rotator cuff tear with cuff arthropathy, all of which were confirmed by MRI scan preoperatively. The patient presents at this time for a reverse right total shoulder arthroplasty.  Procedure:   The patient underwent placement of an interscalene block using Exparel by the anesthesiologist in the preoperative holding area before being brought into the operating room and lain in the supine position. The patient then underwent general endotracheal intubation and anesthesia before the patient was repositioned in the beach chair position using the beach chair positioner. The right shoulder and upper extremity were prepped with ChloraPrep solution before  being draped sterilely. Preoperative antibiotics were administered. A timeout was performed to verify the appropriate surgical site.    A standard anterior approach to the shoulder was made through an approximately 4-5 inch incision. The incision was carried down through the subcutaneous tissues to expose the deltopectoral fascia. The interval between the deltoid and pectoralis muscles was identified and this plane developed, retracting the cephalic vein laterally with the deltoid muscle. The conjoined tendon was identified. Its lateral margin was dissected and the Kolbel self-retraining retractor inserted. The "three sisters" were identified and cauterized. Bursal tissues were removed to improve visualization.   The biceps tendon was identified near the inferior aspect of the bicipital groove. A soft tissue tenodesis was performed by attaching the biceps tendon to the adjacent pectoralis major tendon using two #0 Ethibond interrupted sutures. The biceps tendon was then transected just proximal to the tenodesis site. The subscapularis tendon was released from its attachment to the lesser tuberosity 1 cm proximal to its insertion and several tagging sutures placed. The inferior capsule was released with care after identifying and protecting the axillary nerve. The proximal humeral cut was made at approximately 25 of retroversion using the extra-medullary guide.   Attention was redirected to the glenoid. The labrum was debrided circumferentially before the center of the glenoid was marked with electrocautery. Utilizing the customized Signature guide system, the guidewire was drilled into the glenoid vault. After verifying its position, the customized signature reaming guide was positioned and the guidewire overreamed with the mini-baseplate reamer to the appropriate depth. The augmented guide was then positioned and the drill hole made to  provide proper orientation for the augmented portion of the baseplate the  appropriate large augmented reaming guide was then positioned and the secondary reaming performed.  The accuracy of the glenoid preparation was verified using the large trial guide. The permanent mini-baseplate was impacted into place. It was stabilized with a 30 x 6.5 mm central screw and four peripheral locking screws. The permanent 40 mm +6 mm lateralized glenosphere was then impacted into place and its Morse taper locking mechanism verified using manual distraction.  Attention was directed to the humeral side. The humeral canal was reamed sequentially beginning with the end-cutting reamer then progressing from a 4 mm reamer up to an 18 mm reamer. This provided excellent circumferential chatter. The canal was broached beginning with a #16 broach and progressing to a #18 broach. The plastic stem was inserted into the end of the broach and the proximal reaming performed. A trial reduction was performed using the -6 mm extended neutral humeral platform with the +0 mm insert. With the +0 mm insert, the arm demonstrated excellent range of motion as the hand could be brought across the chest to the opposite shoulder and brought to the top of the patient's head and to the patient's ear. The shoulder appeared stable throughout this range of motion. The joint was dislocated and the trial components removed.   The permanent #18 Identity micro-stem was connected with the -6 mm extended neutral humeral platform on the back table before this construct was impacted into place with care taken to maintain the appropriate version. The +0 mm insert was snapped into place. The shoulder was relocated using two finger pressure and again placed through a range of motion with the findings as described above.  The wound was copiously irrigated with sterile saline solution using the jet lavage system before a total of 30 cc of 0.5% Sensorcaine with epinephrine was injected into the pericapsular and peri-incisional tissues to help  with postoperative analgesia. The subscapularis tendon was reapproximated using #2 FiberWire interrupted sutures. The deltopectoral interval was closed using #0 Vicryl interrupted sutures before the subcutaneous tissues were closed using 2-0 Vicryl interrupted sutures. The skin was closed using staples. Prior to closing the skin, 1 g of transexemic acid in 10 cc of normal saline was injected intra-articularly to help with postoperative bleeding. A sterile occlusive dressing was applied to the wound before the arm was placed into a shoulder immobilizer with an abduction pillow. A Polar Care system also was applied to the shoulder. The patient was then transferred back to a hospital bed before being awakened, extubated, and returned to the recovery room in satisfactory condition after tolerating the procedure well.

## 2023-08-01 ENCOUNTER — Encounter: Payer: Self-pay | Admitting: Surgery

## 2023-08-01 NOTE — Anesthesia Postprocedure Evaluation (Signed)
Anesthesia Post Note  Patient: [name] [name]  Procedure(s) Performed: REVERSE SHOULDER ARTHROPLASTY (Right: Shoulder)  Patient location during evaluation: PACU Anesthesia Type: General and Regional Level of consciousness: awake and alert Pain management: pain level controlled Vital Signs Assessment: post-procedure vital signs reviewed and stable Respiratory status: spontaneous breathing, nonlabored ventilation, respiratory function stable and patient connected to nasal cannula oxygen Cardiovascular status: blood pressure returned to baseline and stable Postop Assessment: no apparent nausea or vomiting Anesthetic complications: no   No notable events documented.   Last Vitals:  Vitals:   07/31/23 1402 07/31/23 1543  BP: (!) 147/80 124/82  Pulse: 89 96  Resp: 17 18  Temp: 36.7 C   SpO2: 95% 97%    Last Pain:  Vitals:   07/31/23 1402  TempSrc:   PainSc: 0-No pain                 Lenard Simmer

## 2023-08-05 ENCOUNTER — Other Ambulatory Visit: Payer: Self-pay | Admitting: Physician Assistant

## 2023-08-06 ENCOUNTER — Encounter: Payer: Self-pay | Admitting: Surgery

## 2023-08-20 ENCOUNTER — Other Ambulatory Visit: Payer: Self-pay | Admitting: Urology

## 2023-08-27 ENCOUNTER — Other Ambulatory Visit: Payer: Self-pay | Admitting: Cardiovascular Disease

## 2023-08-27 DIAGNOSIS — I4819 Other persistent atrial fibrillation: Secondary | ICD-10-CM

## 2023-08-28 NOTE — Telephone Encounter (Signed)
 Prescription refill request for Eliquis received. Indication: AF Last office visit: 05/21/23  R Dunn PA-C Scr: 0.74 on 07/24/23  Epic Age: 78 Weight: 117.5kg  Based on above findings Eliquis 5mg  twice daily is the appropriate dose.  Refill approved.

## 2023-08-30 ENCOUNTER — Encounter: Payer: Self-pay | Admitting: Urology

## 2023-09-01 NOTE — Progress Notes (Unsigned)
 Referring Physician:  Lurena Nida, MD [address] [place],  [place] [address]  Primary Physician:  Lurena Nida, MD  History of Present Illness: 09/02/2023 [name] [name] is a 77 year old with a history of CAD, aortic atherosclerosis, A-fib, type 2 diabetes, hypertension, hyperlipidemia, iron deficiency anemia, obesity, and right reverse shoulder arthroplasty who is here today with a chief complaint of ongoing low back pain for about a year.  Is been intermittent in nature and seems to be worse with standing in place and walking but improved some with bending or laying flat.  He does occasionally have some pain into his right posterior lateral leg but the majority of his complaints are his low back. He is recovering well from his recent shoulder surgery and has been doing physical therapy for this but has not done physical therapy for his back.  Conservative measures:  Physical therapy:  has not participated in PT for his lumbar spine. Multimodal medical therapy including regular antiinflammatories:  tylenol Injections:   11/28/22: Left S1 TF ESI 08/08/22: Left S1 TF ESI  Past Surgery: no spinal surgeries   [name] [name] has no symptoms of cervical myelopathy.  The symptoms are causing a significant impact on the patient's life.   Review of Systems:  A 10 point review of systems is negative, except for the pertinent positives and negatives detailed in the HPI.  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   a.) TTE 09/26/2022: asc Ao 36 mm, Ao root 39 mm   Atrial fibrillation (HCC) 12/28/2020   a.) CHA2DS2-VASc = 5 (age x2, HTN, vascular disease history, T2DM) as of 07/29/2023; b.) s/p DCCV 02/19/2021  --> 200 J x1 -> NSR; c.) cardiac rate/rhythm maintained on oral flecainide + metoprolol succinate; chronically  anticoagulated using apixaban   B12 deficiency    Bilateral adrenal adenomas    BPH (benign prostatic hyperplasia)    CAD (coronary artery disease)    Cholelithiasis    DDD (degenerative disc disease), lumbar    Diastolic dysfunction 09/26/2019   a.) TTE 09/26/2019: EF 60-65%, no RWMAs, G1DD, mild LAE, norm RVSF, asc Ao 36 mm, Ao root 39 mm   Diverticulosis    Elevated platelet count    Hemorrhoids    HLD (hyperlipidemia)    HOH (hard of hearing); wears BILATERAL hearing aids    Hypertension    IDA (iron deficiency anemia)    Left hydrocele    Left inguinal hernia    On apixaban therapy    OSA on CPAP    Pelvic lymphadenopathy    PSVT (paroxysmal supraventricular tachycardia) (HCC) 03/26/2021   a.) Zio 03/26/2021: 4 beat run at max rate 96 bpm   PVC's (premature ventricular contractions)    a.) Zio 03/26/2021: frequent (9.6% burden)   Renal cyst, left    Spinal stenosis    Status post bilateral cataract extraction 2020   T2DM (type 2 diabetes mellitus) (HCC)     Past Surgical History: Past Surgical History:  Procedure Laterality Date   CARDIOVERSION N/A 02/19/2021   Procedure: CARDIOVERSION;  Surgeon: Iran Ouch, MD;  Location: [facility];  Service: Cardiovascular;  Laterality: N/A;   CATARACT EXTRACTION W/PHACO Left 07/14/2018   Procedure: CATARACT EXTRACTION PHACO AND INTRAOCULAR LENS PLACEMENT (IOC) LEFT, DIABETIC;  Surgeon: Galen Manila, MD;  Location:  [facility];  Service: Ophthalmology;  Laterality: Left;  [place] 00:50 CDE 8.93 Fluid pack lot # [id number]   CATARACT EXTRACTION W/PHACO Right 09/01/2018   Procedure: CATARACT EXTRACTION PHACO AND INTRAOCULAR LENS PLACEMENT (IOC)-RIGHT, DIABETIC;  Surgeon: Galen Manila, MD;  Location: [facility];  Service: Ophthalmology;  Laterality: Right;  [place] 00:50.9 CDE 6.80 Fluid Pack Lot # [id number]     COLONOSCOPY     CYSTOSCOPY WITH INSERTION OF UROLIFT     CYSTOSCOPY WITH INSERTION OF UROLIFT  08/2018    ESOPHAGOGASTRODUODENOSCOPY (EGD) WITH PROPOFOL N/A 03/17/2019   Procedure: ESOPHAGOGASTRODUODENOSCOPY (EGD) WITH PROPOFOL;  Surgeon: Toledo, Boykin Nearing, MD;  Location: [facility];  Service: Gastroenterology;  Laterality: N/A;   REVERSE SHOULDER ARTHROPLASTY Right 07/31/2023   Procedure: REVERSE SHOULDER ARTHROPLASTY;  Surgeon: Christena Flake, MD;  Location: [facility];  Service: Orthopedics;  Laterality: Right;   TOTAL HIP ARTHROPLASTY Left 02/23/2020   Procedure: TOTAL HIP ARTHROPLASTY;  Surgeon: Donato Heinz, MD;  Location: [facility];  Service: Orthopedics;  Laterality: Left;   TOTAL HIP ARTHROPLASTY Right 01/10/2021   Procedure: TOTAL HIP ARTHROPLASTY;  Surgeon: Donato Heinz, MD;  Location: [facility];  Service: Orthopedics;  Laterality: Right;   TOTAL KNEE ARTHROPLASTY Bilateral 2014   Total knee replacement.    Allergies: Allergies as of 09/02/2023   (No Known Allergies)    Medications: Outpatient Encounter Medications as of 09/02/2023  Medication Sig   acetaminophen (TYLENOL) 650 MG CR tablet Take 1,300 mg by mouth every 8 (eight) hours as needed for pain.   ALPRAZolam (XANAX) 0.5 MG tablet Take 1 mg by mouth at bedtime as needed for sleep.   amLODipine (NORVASC) 5 MG tablet Take 5 mg by mouth daily.   atorvastatin (LIPITOR) 40 MG tablet Take 1 tablet (40 mg total) by mouth every evening. Due for follow up visit.  Please call to schedule appointment prior to next refill   benazepril-hydrochlorthiazide (LOTENSIN HCT) 20-12.5 MG tablet Take 1 tablet by mouth in the morning.   Cyanocobalamin (VITAMIN B-12) 5000 MCG TBDP Take 10,000 mcg by mouth every evening.   ELIQUIS 5 MG TABS tablet TAKE 1 TABLET BY MOUTH 2 TIMES DAILY.   ferrous sulfate 325 (65 FE) MG EC tablet Take 325 mg by mouth every evening.   flecainide (TAMBOCOR) 50 MG tablet TAKE ONE TABLET BY MOUTH TWICE DAILY   glimepiride (AMARYL) 1 MG tablet Take 1 mg by mouth daily with breakfast.   JARDIANCE 25 MG TABS tablet Take  25 mg by mouth in the morning.   metFORMIN (GLUCOPHAGE-XR) 500 MG 24 hr tablet Take 1,000 mg by mouth 2 (two) times daily with a meal.   metoprolol succinate (TOPROL-XL) 25 MG 24 hr tablet TAKE 1 TABLET BY MOUTH DAILY.   naphazoline-glycerin (CLEAR EYES REDNESS) 0.012-0.25 % SOLN Place 1-2 drops into both eyes 4 (four) times daily as needed (dry/irritated eyes.).   OZEMPIC, 2 MG/DOSE, 8 MG/3ML SOPN Inject 2 mg into the skin every Thursday at 6pm.   sertraline (ZOLOFT) 50 MG tablet Take 50 mg by mouth in the morning.   silodosin (RAPAFLO) 8 MG CAPS capsule TAKE 1 CAPSULE BY MOUTH ONCE DAILY WITH BREAKFAST   zinc gluconate 50 MG tablet Take 50 mg by mouth every evening.   [DISCONTINUED] chlorhexidine (HIBICLENS) 4 % external liquid Apply 15 mLs (1 Application total) topically as directed for 30 doses. Use as directed daily for 5 days every other week for 6 weeks.   [DISCONTINUED] oxyCODONE (ROXICODONE) 5  MG immediate release tablet Take 1-2 tablets (5-10 mg total) by mouth every 4 (four) hours as needed for moderate pain (pain score 4-6) or severe pain (pain score 7-10).   [DISCONTINUED] Vibegron (GEMTESA) 75 MG TABS Take 1 tablet (75 mg total) by mouth every evening.   No facility-administered encounter medications on file as of 09/02/2023.    Social History: Social History   Tobacco Use   Smoking status: Never   Smokeless tobacco: Never  Vaping Use   Vaping status: Never Used  Substance Use Topics   Alcohol use: Yes    Comment: wine-occasionally Previously drank a shot of vodka in the evenings   Drug use: Never    Family Medical History: Family History  Problem Relation Age of Onset   Diabetes Mother    Alzheimer's disease Mother    Colon cancer Sister     Physical Examination: Today's Vitals   09/02/23 1021  BP: 130/86  Weight: 118.4 kg  Height: 5' 9.5" (1.765 m)  PainSc: 6   PainLoc: Back   Body mass index is 37.99 kg/m.  General: Patient is well developed, well  nourished, calm, collected, and in no apparent distress. Attention to examination is appropriate.  Psychiatric: Patient is non-anxious.  Head:  Pupils equal, round, and reactive to light.  ENT:  Oral mucosa appears well hydrated.  Neck:   Supple.  Respiratory: Patient is breathing without any difficulty.  Extremities: No edema.  Vascular: Palpable dorsal pedal pulses.  Skin:   On exposed skin, there are no abnormal skin lesions.  NEUROLOGICAL:     Awake, alert, oriented to person, place, and time.  Speech is clear and fluent. Fund of knowledge is appropriate.   Cranial Nerves: Pupils equal round and reactive to light.  Facial tone is symmetric.  Facial sensation is symmetric.  ROM of spine: full.  Palpation of spine: non tender.    Strength: Side Biceps Triceps Deltoid Interossei Grip Wrist Ext. Wrist Flex.  R 5 5 5 5 5 5 5   L 5 5 5 5 5 5 5    Side Iliopsoas Quads Hamstring PF DF EHL  R 5 5 5 5 5 5   L 5 5 5 5 5 5    Reflexes are 2+ and symmetric at the biceps, triceps, brachioradialis, and achilles.  Absent bilateral patellar reflexes Hoffman's is absent.  Clonus is not present.  Toes are down-going.  Bilateral upper and lower extremity sensation is intact to light touch.    Ambulates with an antalgic gait  Medical Decision Making  Imaging: 07/10/22 MRI L spine IMPRESSION: 1. 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 2. Severe neuroforaminal narrowing at L4-L5 (left) and L5-S1 (left) 3. Additional degenerative changes, as above. 4. Prominent periaortic lymph node measuring up to 1.5 cm, nonspecific. Correlate with PSA and consider further evaluation with a dedicated CT of the abdomen and pelvis.     Electronically Signed   By: Lorenza Cambridge M.D.   On: 07/10/2022 09:20   I have personally reviewed the  images and agree with the above interpretation.  Assessment and Plan: Mr. [name] [name] is a pleasant 78 y.o. male with acute on chronic low back pain with some degree of right rating leg pain and numbness and tingling in his feet which could be related to his lumbar stenosis.  His primary complaint is low back pain.  We briefly discussed his imaging but he is not interested in a large invasive surgery.  We discussed that should we want to consider further surgical intervention he would likely need scoliosis x-rays, completing physical therapy, and an updated MRI.  I have reached out to Dr. Mariah Milling to see if she would be willing to attempt facet injections.  I will contact the patient via MyChart after I hear back from Dr. Mariah Milling.  He was encouraged to call our office with any questions or concerns.  He expressed understanding and was in agreement with this plan.  Thank you for involving me in the care of this patient.   I spent a total of 42 minutes in both face-to-face and non-face-to-face activities for this visit on the date of this encounter including review of outside records, review of imaging, discussion of symptoms, physical exam, discussion of differential diagnosis, documentation, and communication with other medical providers.   [facility]

## 2023-09-02 ENCOUNTER — Encounter: Payer: Self-pay | Admitting: Neurosurgery

## 2023-09-02 ENCOUNTER — Ambulatory Visit: Payer: Medicare Other | Admitting: Neurosurgery

## 2023-09-02 VITALS — BP 130/86 | Ht 69.5 in | Wt 261.0 lb

## 2023-09-02 DIAGNOSIS — M545 Low back pain, unspecified: Secondary | ICD-10-CM | POA: Diagnosis not present

## 2023-09-02 DIAGNOSIS — M48061 Spinal stenosis, lumbar region without neurogenic claudication: Secondary | ICD-10-CM

## 2023-09-02 DIAGNOSIS — M5440 Lumbago with sciatica, unspecified side: Secondary | ICD-10-CM

## 2023-09-02 DIAGNOSIS — M5416 Radiculopathy, lumbar region: Secondary | ICD-10-CM

## 2023-10-02 ENCOUNTER — Other Ambulatory Visit: Payer: Self-pay | Admitting: Cardiovascular Disease

## 2023-10-03 ENCOUNTER — Ambulatory Visit: Payer: Medicare Other | Admitting: Urology

## 2023-10-11 ENCOUNTER — Encounter: Payer: Self-pay | Admitting: Urology

## 2023-11-03 ENCOUNTER — Other Ambulatory Visit: Payer: Self-pay | Admitting: Cardiovascular Disease

## 2023-11-03 ENCOUNTER — Other Ambulatory Visit: Payer: Self-pay | Admitting: Physician Assistant

## 2023-11-14 ENCOUNTER — Ambulatory Visit
Admission: RE | Admit: 2023-11-14 | Discharge: 2023-11-14 | Disposition: A | Discharge status: Home / Self Care | Payer: Medicare Other | Source: Ambulatory Visit | Attending: Oncology | Admitting: Oncology

## 2023-11-14 DIAGNOSIS — R599 Enlarged lymph nodes, unspecified: Secondary | ICD-10-CM | POA: Diagnosis present

## 2023-11-14 LAB — POCT I-STAT CREATININE: Creatinine, Ser: 0.9 mg/dL (ref 0.61–1.24)

## 2023-11-14 MED ORDER — IOHEXOL 300 MG/ML  SOLN
100.0000 mL | Freq: Once | INTRAMUSCULAR | Status: AC | PRN
  Administered 2023-11-14: 100 mL via INTRAVENOUS

## 2023-11-21 ENCOUNTER — Other Ambulatory Visit: Payer: Self-pay | Admitting: Urology

## 2023-11-27 ENCOUNTER — Other Ambulatory Visit: Payer: Self-pay

## 2023-11-27 DIAGNOSIS — R599 Enlarged lymph nodes, unspecified: Secondary | ICD-10-CM

## 2023-11-28 ENCOUNTER — Inpatient Hospital Stay: Payer: Medicare Other | Attending: Oncology

## 2023-11-28 ENCOUNTER — Inpatient Hospital Stay (HOSPITAL_BASED_OUTPATIENT_CLINIC_OR_DEPARTMENT_OTHER): Payer: Medicare Other | Admitting: Oncology

## 2023-11-28 ENCOUNTER — Encounter: Payer: Self-pay | Admitting: Oncology

## 2023-11-28 VITALS — BP 134/74 | HR 89 | Temp 97.9°F | Resp 17 | Wt 256.0 lb

## 2023-11-28 DIAGNOSIS — Z79899 Other long term (current) drug therapy: Secondary | ICD-10-CM | POA: Diagnosis not present

## 2023-11-28 DIAGNOSIS — I4891 Unspecified atrial fibrillation: Secondary | ICD-10-CM | POA: Diagnosis not present

## 2023-11-28 DIAGNOSIS — Z833 Family history of diabetes mellitus: Secondary | ICD-10-CM | POA: Diagnosis not present

## 2023-11-28 DIAGNOSIS — Z7901 Long term (current) use of anticoagulants: Secondary | ICD-10-CM | POA: Diagnosis not present

## 2023-11-28 DIAGNOSIS — Z818 Family history of other mental and behavioral disorders: Secondary | ICD-10-CM | POA: Insufficient documentation

## 2023-11-28 DIAGNOSIS — R590 Localized enlarged lymph nodes: Secondary | ICD-10-CM | POA: Diagnosis present

## 2023-11-28 DIAGNOSIS — M545 Low back pain, unspecified: Secondary | ICD-10-CM | POA: Insufficient documentation

## 2023-11-28 DIAGNOSIS — N281 Cyst of kidney, acquired: Secondary | ICD-10-CM | POA: Diagnosis not present

## 2023-11-28 DIAGNOSIS — D72829 Elevated white blood cell count, unspecified: Secondary | ICD-10-CM | POA: Insufficient documentation

## 2023-11-28 DIAGNOSIS — D649 Anemia, unspecified: Secondary | ICD-10-CM

## 2023-11-28 DIAGNOSIS — R599 Enlarged lymph nodes, unspecified: Secondary | ICD-10-CM | POA: Diagnosis not present

## 2023-11-28 DIAGNOSIS — M2550 Pain in unspecified joint: Secondary | ICD-10-CM | POA: Diagnosis not present

## 2023-11-28 DIAGNOSIS — E119 Type 2 diabetes mellitus without complications: Secondary | ICD-10-CM | POA: Insufficient documentation

## 2023-11-28 DIAGNOSIS — K4090 Unilateral inguinal hernia, without obstruction or gangrene, not specified as recurrent: Secondary | ICD-10-CM | POA: Diagnosis not present

## 2023-11-28 DIAGNOSIS — D75839 Thrombocytosis, unspecified: Secondary | ICD-10-CM | POA: Diagnosis not present

## 2023-11-28 DIAGNOSIS — K5730 Diverticulosis of large intestine without perforation or abscess without bleeding: Secondary | ICD-10-CM | POA: Insufficient documentation

## 2023-11-28 DIAGNOSIS — Z800 Family history of malignant neoplasm of digestive organs: Secondary | ICD-10-CM | POA: Diagnosis not present

## 2023-11-28 DIAGNOSIS — K8020 Calculus of gallbladder without cholecystitis without obstruction: Secondary | ICD-10-CM | POA: Insufficient documentation

## 2023-11-28 DIAGNOSIS — Z860101 Personal history of adenomatous and serrated colon polyps: Secondary | ICD-10-CM | POA: Diagnosis not present

## 2023-11-28 DIAGNOSIS — Z8719 Personal history of other diseases of the digestive system: Secondary | ICD-10-CM | POA: Diagnosis not present

## 2023-11-28 LAB — CBC WITH DIFFERENTIAL (CANCER CENTER ONLY)
Abs Immature Granulocytes: 0.17 10*3/uL — ABNORMAL HIGH (ref 0.00–0.07)
Basophils Absolute: 0.1 10*3/uL (ref 0.0–0.1)
Basophils Relative: 1 %
Eosinophils Absolute: 0.2 10*3/uL (ref 0.0–0.5)
Eosinophils Relative: 2 %
HCT: 33.8 % — ABNORMAL LOW (ref 39.0–52.0)
Hemoglobin: 10.8 g/dL — ABNORMAL LOW (ref 13.0–17.0)
Immature Granulocytes: 1 %
Lymphocytes Relative: 13 %
Lymphs Abs: 1.9 10*3/uL (ref 0.7–4.0)
MCH: 28.2 pg (ref 26.0–34.0)
MCHC: 32 g/dL (ref 30.0–36.0)
MCV: 88.3 fL (ref 80.0–100.0)
Monocytes Absolute: 1.5 10*3/uL — ABNORMAL HIGH (ref 0.1–1.0)
Monocytes Relative: 10 %
Neutro Abs: 10.4 10*3/uL — ABNORMAL HIGH (ref 1.7–7.7)
Neutrophils Relative %: 73 %
Platelet Count: 633 10*3/uL — ABNORMAL HIGH (ref 150–400)
RBC: 3.83 MIL/uL — ABNORMAL LOW (ref 4.22–5.81)
RDW: 14 % (ref 11.5–15.5)
WBC Count: 14.3 10*3/uL — ABNORMAL HIGH (ref 4.0–10.5)
nRBC: 0 % (ref 0.0–0.2)

## 2023-11-28 LAB — CMP (CANCER CENTER ONLY)
ALT: 18 U/L (ref 0–44)
AST: 13 U/L — ABNORMAL LOW (ref 15–41)
Albumin: 3 g/dL — ABNORMAL LOW (ref 3.5–5.0)
Alkaline Phosphatase: 85 U/L (ref 38–126)
Anion gap: 12 (ref 5–15)
BUN: 11 mg/dL (ref 8–23)
CO2: 26 mmol/L (ref 22–32)
Calcium: 8.7 mg/dL — ABNORMAL LOW (ref 8.9–10.3)
Chloride: 90 mmol/L — ABNORMAL LOW (ref 98–111)
Creatinine: 0.77 mg/dL (ref 0.61–1.24)
GFR, Estimated: >60 mL/min (ref >60)
Glucose, Bld: 212 mg/dL — ABNORMAL HIGH (ref 70–99)
Potassium: 4.6 mmol/L (ref 3.5–5.1)
Sodium: 128 mmol/L — ABNORMAL LOW (ref 135–145)
Total Bilirubin: 0.4 mg/dL (ref 0.0–1.2)
Total Protein: 8 g/dL (ref 6.5–8.1)

## 2023-11-28 NOTE — Progress Notes (Signed)
 Patient here for oncology follow-up appointment, concerns of neuropathy, SOB and fatigue

## 2023-11-30 ENCOUNTER — Encounter: Payer: Self-pay | Admitting: Oncology

## 2023-11-30 NOTE — Progress Notes (Signed)
 Hematology/Oncology Consult note [facility]  [phone] [phone]  Patient Care Team: Thomos Flies, MD as PCP - General (Rheumatology) Wenona Hamilton, MD as PCP - Cardiology (Cardiology) Hooten, Robbie Chiles, MD (Orthopedic Surgery) Marlyne Sing, MD (Hematology and Oncology) Avonne Boettcher, MD as Consulting Physician (Oncology)   Name of the patient: [name] [name]  [id number]  12-Oct-1945   Date of visit: 11/30/23  Diagnosis-  intrabadominal adenopathy likely benign   Chief complaint/ Reason for visit- discuss CT scan results and further management  Heme/Onc history: Patient is a 78 year old male who has seen me in the past for iron  deficiency. More recently patient underwent MRI lumbar spine in January 2024 for symptoms of low back pain which showed evidence of spinal stenosis but incidentally showed a prominent periaortic lymph node measuring 1.5 cm. This was followed by a CT abdomen and pelvis with contrast which showed bilateral adrenal adenomas. He was also noted to have external iliac lymphadenopathy measuring 2.4 x 1.9 cm and a common iliac lymph node measuring 3.3 x 1.5 cm. Masslike area identified in the scrotum of on the left side measuring 6.6 x 6.1 cm. 11 mm cystic lesion in the uncinate process of pancreas for which MRI was recommended in 2 years.   Interval history- patient underwent right shoulder surgery 2 months ago. He is recovering well presently. Denies any pain, swelling or symptoms of fever  ECOG PS- 1 Pain scale- 3   Review of systems- Review of Systems  Constitutional:  Negative for chills, fever, malaise/fatigue and weight loss.  HENT:  Negative for congestion, ear discharge and nosebleeds.   Eyes:  Negative for blurred vision.  Respiratory:  Negative for cough, hemoptysis, sputum production, shortness of breath and wheezing.   Cardiovascular:  Negative for chest pain, palpitations, orthopnea and claudication.   Gastrointestinal:  Negative for abdominal pain, blood in stool, constipation, diarrhea, heartburn, melena, nausea and vomiting.  Genitourinary:  Negative for dysuria, flank pain, frequency, hematuria and urgency.  Musculoskeletal:  Positive for joint pain. Negative for back pain and myalgias.  Skin:  Negative for rash.  Neurological:  Negative for dizziness, tingling, focal weakness, seizures, weakness and headaches.  Endo/Heme/Allergies:  Does not bruise/bleed easily.  Psychiatric/Behavioral:  Negative for depression and suicidal ideas. The patient does not have insomnia.       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   a.) TTE 09/26/2022: asc Ao 36 mm, Ao root 39 mm   Atrial fibrillation (HCC) 12/28/2020   a.) CHA2DS2-VASc = 5 (age x2, HTN, vascular disease history, T2DM) as of 07/29/2023; b.) s/p DCCV 02/19/2021  --> 200 J x1 -> NSR; c.) cardiac rate/rhythm maintained on oral flecainide  + metoprolol  succinate; chronically anticoagulated using apixaban    B12 deficiency    Bilateral adrenal adenomas    BPH (benign prostatic hyperplasia)    CAD (coronary artery disease)    Cholelithiasis    DDD (degenerative disc disease), lumbar    Diastolic dysfunction 09/26/2019   a.) TTE 09/26/2019: EF 60-65%, no RWMAs, G1DD, mild LAE, norm RVSF, asc Ao 36 mm, Ao root 39 mm   Diverticulosis    Elevated platelet count    Hemorrhoids    HLD (hyperlipidemia)    HOH (hard of hearing); wears BILATERAL hearing aids  Hypertension    IDA (iron  deficiency anemia)    Left hydrocele    Left inguinal hernia    On apixaban  therapy    OSA on CPAP    Pelvic lymphadenopathy    PSVT (paroxysmal supraventricular tachycardia) (HCC) 03/26/2021   a.) Zio 03/26/2021: 4 beat run at max rate 96 bpm   PVC's (premature ventricular contractions)    a.) Zio  03/26/2021: frequent (9.6% burden)   Renal cyst, left    Spinal stenosis    Status post bilateral cataract extraction 2020   T2DM (type 2 diabetes mellitus) (HCC)      Past Surgical History:  Procedure Laterality Date   CARDIOVERSION N/A 02/19/2021   Procedure: CARDIOVERSION;  Surgeon: Wenona Hamilton, MD;  Location: [facility] ORS;  Service: Cardiovascular;  Laterality: N/A;   CATARACT EXTRACTION W/PHACO Left 07/14/2018   Procedure: CATARACT EXTRACTION PHACO AND INTRAOCULAR LENS PLACEMENT (IOC) LEFT, DIABETIC;  Surgeon: Clair Crews, MD;  Location: [facility];  Service: Ophthalmology;  Laterality: Left;  US  00:50 CDE 8.93 Fluid pack lot # [id number]   CATARACT EXTRACTION W/PHACO Right 09/01/2018   Procedure: CATARACT EXTRACTION PHACO AND INTRAOCULAR LENS PLACEMENT (IOC)-RIGHT, DIABETIC;  Surgeon: Clair Crews, MD;  Location: [facility];  Service: Ophthalmology;  Laterality: Right;  US  00:50.9 CDE 6.80 Fluid Pack Lot # [id number]     COLONOSCOPY     CYSTOSCOPY WITH INSERTION OF UROLIFT     CYSTOSCOPY WITH INSERTION OF UROLIFT  08/2018   ESOPHAGOGASTRODUODENOSCOPY (EGD) WITH PROPOFOL  N/A 03/17/2019   Procedure: ESOPHAGOGASTRODUODENOSCOPY (EGD) WITH PROPOFOL ;  Surgeon: Toledo, Alphonsus Jeans, MD;  Location: [facility];  Service: Gastroenterology;  Laterality: N/A;   REVERSE SHOULDER ARTHROPLASTY Right 07/31/2023   Procedure: REVERSE SHOULDER ARTHROPLASTY;  Surgeon: Elner Hahn, MD;  Location: [facility];  Service: Orthopedics;  Laterality: Right;   TOTAL HIP ARTHROPLASTY Left 02/23/2020   Procedure: TOTAL HIP ARTHROPLASTY;  Surgeon: Arlyne Lame, MD;  Location: [facility];  Service: Orthopedics;  Laterality: Left;   TOTAL HIP ARTHROPLASTY Right 01/10/2021   Procedure: TOTAL HIP ARTHROPLASTY;  Surgeon: Arlyne Lame, MD;  Location: [facility];  Service: Orthopedics;  Laterality: Right;   TOTAL KNEE ARTHROPLASTY Bilateral 2014   Total knee replacement.    Social History    Socioeconomic History   Marital status: Married    Spouse name: Aurora Blowers   Number of children: 3   Years of education: Not on file   Highest education level: Not on file  Occupational History   Occupation: Nurse, adult, Personnel officer    Comment: retired  Tobacco Use   Smoking status: Never   Smokeless tobacco: Never  Vaping Use   Vaping status: Never Used  Substance and Sexual Activity   Alcohol use: Yes    Comment: wine-occasionally Previously drank a shot of vodka in the evenings   Drug use: Never   Sexual activity: Not on file  Other Topics Concern   Not on file  Social History Narrative   ** Merged History Encounter **       Patient lives with wife.  Feels safe   Social Drivers of Corporate investment banker Strain: Low Risk  (09/12/2023)   Received [facility]   Overall Financial Resource Strain (CARDIA)    Difficulty of Paying Living Expenses: Not hard at all  Food Insecurity: No Food Insecurity (09/12/2023)   Received from [facility]   Hunger Vital Sign    Worried About Running Out of Food  in the Last Year: Never true    Ran Out of Food in the Last Year: Never true  Transportation Needs: No Transportation Needs (09/12/2023)   Received from [facility] - Transportation    In the past 12 months, has lack of transportation kept you from medical appointments or from getting medications?: No    Lack of Transportation (Non-Medical): No  Physical Activity: Not on file  Stress: Not on file  Social Connections: Not on file  Intimate Partner Violence: Not on file    Family History  Problem Relation Age of Onset   Diabetes Mother    Alzheimer's disease Mother    Colon cancer Sister      Current Outpatient Medications:    acetaminophen  (TYLENOL ) 650 MG CR tablet, Take 1,300 mg by mouth every 8 (eight) hours as needed for pain., Disp: , Rfl:    ALPRAZolam  (XANAX ) 0.5 MG tablet, Take 1 mg by mouth at  bedtime as needed for sleep., Disp: , Rfl:    amLODipine  (NORVASC ) 5 MG tablet, Take 5 mg by mouth daily., Disp: , Rfl:    atorvastatin  (LIPITOR) 40 MG tablet, TAKE 1 TABLET BY MOUTH DAILY, Disp: 30 tablet, Rfl: 0   benazepril -hydrochlorthiazide (LOTENSIN  HCT) 20-12.5 MG tablet, Take 1 tablet by mouth in the morning., Disp: , Rfl:    Cyanocobalamin  (VITAMIN B-12) 5000 MCG TBDP, Take 10,000 mcg by mouth every evening., Disp: , Rfl:    ELIQUIS  5 MG TABS tablet, TAKE 1 TABLET BY MOUTH 2 TIMES DAILY., Disp: 60 tablet, Rfl: 5   ferrous sulfate  325 (65 FE) MG EC tablet, Take 325 mg by mouth every evening., Disp: , Rfl:    flecainide  (TAMBOCOR ) 50 MG tablet, TAKE ONE TABLET BY MOUTH TWICE DAILY, Disp: 180 tablet, Rfl: 0   glimepiride  (AMARYL ) 1 MG tablet, Take 1 mg by mouth daily with breakfast., Disp: , Rfl:    JARDIANCE 25 MG TABS tablet, Take 25 mg by mouth in the morning., Disp: , Rfl:    metFORMIN  (GLUCOPHAGE -XR) 500 MG 24 hr tablet, Take 1,000 mg by mouth 2 (two) times daily with a meal., Disp: , Rfl:    metoprolol  succinate (TOPROL -XL) 25 MG 24 hr tablet, TAKE 1 TABLET BY MOUTH DAILY., Disp: 30 tablet, Rfl: 0   naphazoline-glycerin  (CLEAR EYES REDNESS) 0.012-0.25 % SOLN, Place 1-2 drops into both eyes 4 (four) times daily as needed (dry/irritated eyes.)., Disp: , Rfl:    OZEMPIC , 2 MG/DOSE, 8 MG/3ML SOPN, Inject 2 mg into the skin every Thursday at 6pm., Disp: , Rfl:    sertraline  (ZOLOFT ) 50 MG tablet, Take 50 mg by mouth in the morning., Disp: , Rfl:    silodosin  (RAPAFLO ) 8 MG CAPS capsule, TAKE 1 CAPSULE BY MOUTH ONCE DAILY WITH BREAKFAST, Disp: 30 capsule, Rfl: 6   zinc gluconate 50 MG tablet, Take 50 mg by mouth every evening., Disp: , Rfl:   Physical exam:  Vitals:   11/28/23 1006  BP: 134/74  Pulse: 89  Resp: 17  Temp: 97.9 F (36.6 C)  TempSrc: Tympanic  SpO2: 100%  Weight: 256 lb (116.1 kg)   Physical Exam Cardiovascular:     Rate and Rhythm: Normal rate and regular rhythm.      Heart sounds: Normal heart sounds.  Pulmonary:     Effort: Pulmonary effort is normal.     Breath sounds: Normal breath sounds.  Abdominal:     General: Bowel sounds are normal.  Palpations: Abdomen is soft.  Skin:    General: Skin is warm and dry.  Neurological:     Mental Status: He is alert and oriented to person, place, and time.      I have personally reviewed labs listed below:    Latest Ref Rng & Units 11/28/2023    9:43 AM  CMP  Glucose 70 - 99 mg/dL 161   BUN 8 - 23 mg/dL 11   Creatinine 0.96 - 1.24 mg/dL 0.45   Sodium 409 - 811 mmol/L 128   Potassium 3.5 - 5.1 mmol/L 4.6   Chloride 98 - 111 mmol/L 90   CO2 22 - 32 mmol/L 26   Calcium  8.9 - 10.3 mg/dL 8.7   Total Protein 6.5 - 8.1 g/dL 8.0   Total Bilirubin 0.0 - 1.2 mg/dL 0.4   Alkaline Phos 38 - 126 U/L 85   AST 15 - 41 U/L 13   ALT 0 - 44 U/L 18       Latest Ref Rng & Units 11/28/2023    9:43 AM  CBC  WBC 4.0 - 10.5 K/uL 14.3   Hemoglobin 13.0 - 17.0 g/dL 91.4   Hematocrit 78.2 - 52.0 % 33.8   Platelets 150 - 400 K/uL 633    I have personally reviewed Radiology images listed below: No images are attached to the encounter.  CT ABDOMEN PELVIS W CONTRAST Result Date: 11/15/2023 CLINICAL DATA:  Follow-up abdominal adenopathy EXAM: CT ABDOMEN AND PELVIS WITH CONTRAST TECHNIQUE: Multidetector CT imaging of the abdomen and pelvis was performed using the standard protocol following bolus administration of intravenous contrast. RADIATION DOSE REDUCTION: This exam was performed according to the departmental dose-optimization program which includes automated exposure control, adjustment of the mA and/or kV according to patient size and/or use of iterative reconstruction technique. CONTRAST:  OMNIPAQUE  IOHEXOL  300 MG/ML  SOLN COMPARISON:  CT of the abdomen and pelvis performed May 14, 2023 FINDINGS: Lower chest: No acute abnormality. Hepatobiliary: Gallstones or sludge are favored within the dependent  portion the gallbladder. Pancreas: Unremarkable. No pancreatic ductal dilatation or surrounding inflammatory changes. Spleen: Normal in size without focal abnormality. Adrenals/Urinary Tract: A left renal cyst is present which measures 10 cm. No hydronephrosis. Mild diffuse thickening of both adrenal glands. The urinary bladder is grossly unremarkable. Stomach/Bowel: No dilated loops of bowel are seen. A moderate volume of stool material is present in the colon. Scattered colonic diverticulosis. Previously observed rectal wall thickening is less prominent when compared to the previous examination. Vascular/Lymphatic: A left retroperitoneal lymph node is present which is in close proximity to the left common iliac artery and measures 3.0 x 1.3 cm on image 54 of series 2 which is similar in morphology and size when compared to the previous exam. A left external iliac lymph node is also present which is estimated at 2.4 x 1.7 cm on image 71 of series 2, also similar. Reproductive: Postsurgical changes are present in the prostatic bed. Other: Nothing significant.  Fat containing left inguinal hernia. Musculoskeletal: Postsurgical changes from bilateral hip arthroplasty with significant beam hardening artifact in the pelvis. IMPRESSION: 1. Similar appearance of left pelvic lymphadenopathy when compared to the prior examination. Electronically Signed   By: Reagan Camera M.D.   On: 11/15/2023 05:14     Assessment and plan- Patient is a 78 y.o. male with history of intra-abdominal adenopathy here to discuss CT scan results and Further management  CT abdomen and pelvis with contrast which has not shown  any change in the size of left pelvic adenopathy which remained stable between 2 to 3 cm.  We have discussed this case at tumor board in the past and these lymph nodes are not easily amenable to biopsy.  Given that these lymph nodes have remained stable since January 2024 I am holding off on any invasive procedures at  this time.  We will plan on getting another CT scan now in 1 years time  Labs today show leukocytosis with a white cell count of 14 predominantly with neutrophilia as well as thrombocytosis.  This seems reactive.  Hemoglobin is presently at his baseline between 10.5-12.  Clinically he does not report any signs and symptoms of infection.  I will check CBC with differential again with ferritin and iron  studies B12 and folate in 2 months and I will see him back in 4 months with labs     Visit Diagnosis 1. Adenopathy   2. Thrombocytosis   3. Normocytic anemia      Dr. Seretha Dance, MD, MPH [facility] [phone] 11/30/2023 10:18 AM

## 2023-12-05 ENCOUNTER — Other Ambulatory Visit: Payer: Self-pay | Admitting: Urology

## 2023-12-09 ENCOUNTER — Encounter: Payer: Self-pay | Admitting: Internal Medicine

## 2023-12-11 ENCOUNTER — Encounter: Payer: Self-pay | Admitting: Oncology

## 2023-12-11 DIAGNOSIS — D75839 Thrombocytosis, unspecified: Secondary | ICD-10-CM

## 2023-12-11 DIAGNOSIS — D72829 Elevated white blood cell count, unspecified: Secondary | ICD-10-CM

## 2023-12-11 DIAGNOSIS — R599 Enlarged lymph nodes, unspecified: Secondary | ICD-10-CM

## 2023-12-13 NOTE — Telephone Encounter (Signed)
 PET scan was ordered. Needs to be scheduled. I will see him after pet

## 2023-12-17 ENCOUNTER — Ambulatory Visit
Admission: RE | Admit: 2023-12-17 | Discharge: 2023-12-17 | Disposition: A | Discharge status: Home / Self Care | Source: Ambulatory Visit | Attending: Oncology

## 2023-12-17 DIAGNOSIS — R599 Enlarged lymph nodes, unspecified: Secondary | ICD-10-CM

## 2023-12-17 DIAGNOSIS — N433 Hydrocele, unspecified: Secondary | ICD-10-CM | POA: Diagnosis not present

## 2023-12-17 DIAGNOSIS — D72829 Elevated white blood cell count, unspecified: Secondary | ICD-10-CM | POA: Diagnosis not present

## 2023-12-17 DIAGNOSIS — D3501 Benign neoplasm of right adrenal gland: Secondary | ICD-10-CM | POA: Insufficient documentation

## 2023-12-17 DIAGNOSIS — K4090 Unilateral inguinal hernia, without obstruction or gangrene, not specified as recurrent: Secondary | ICD-10-CM | POA: Insufficient documentation

## 2023-12-17 DIAGNOSIS — E119 Type 2 diabetes mellitus without complications: Secondary | ICD-10-CM | POA: Insufficient documentation

## 2023-12-17 DIAGNOSIS — K5730 Diverticulosis of large intestine without perforation or abscess without bleeding: Secondary | ICD-10-CM | POA: Insufficient documentation

## 2023-12-17 DIAGNOSIS — D649 Anemia, unspecified: Secondary | ICD-10-CM | POA: Insufficient documentation

## 2023-12-17 DIAGNOSIS — R590 Localized enlarged lymph nodes: Secondary | ICD-10-CM | POA: Diagnosis present

## 2023-12-17 DIAGNOSIS — D3502 Benign neoplasm of left adrenal gland: Secondary | ICD-10-CM | POA: Insufficient documentation

## 2023-12-17 DIAGNOSIS — G8929 Other chronic pain: Secondary | ICD-10-CM | POA: Diagnosis not present

## 2023-12-17 DIAGNOSIS — D75839 Thrombocytosis, unspecified: Secondary | ICD-10-CM | POA: Diagnosis not present

## 2023-12-17 DIAGNOSIS — Z96611 Presence of right artificial shoulder joint: Secondary | ICD-10-CM | POA: Diagnosis not present

## 2023-12-17 LAB — GLUCOSE, CAPILLARY: Glucose-Capillary: 144 mg/dL — ABNORMAL HIGH (ref 70–99)

## 2023-12-17 MED ORDER — FLUDEOXYGLUCOSE F - 18 (FDG) INJECTION
12.0000 | Freq: Once | INTRAVENOUS | Status: AC | PRN
  Administered 2023-12-17: 12.29 via INTRAVENOUS

## 2023-12-20 ENCOUNTER — Other Ambulatory Visit: Payer: Self-pay | Admitting: Oncology

## 2023-12-25 ENCOUNTER — Ambulatory Visit: Payer: Self-pay | Admitting: Oncology

## 2023-12-25 ENCOUNTER — Other Ambulatory Visit

## 2023-12-26 ENCOUNTER — Other Ambulatory Visit: Payer: Self-pay

## 2023-12-26 ENCOUNTER — Encounter: Payer: Self-pay | Admitting: Oncology

## 2023-12-26 DIAGNOSIS — R599 Enlarged lymph nodes, unspecified: Secondary | ICD-10-CM

## 2023-12-26 NOTE — Telephone Encounter (Signed)
-----   Message from [name] [name] sent at 12/25/2023  6:33 PM EDT ----- Pt is agreeable for ct guided biopsy. Please arrange ----- Message ----- From: Interface, Rad Results In Sent: 12/18/2023  10:07 AM EDT To: [name] JAYSON Skene, MD

## 2023-12-26 NOTE — Telephone Encounter (Signed)
 Per [name] Patient called back returning your call, I see your note [name] [name], he can Tues 7/8 at 11a an arrive at 10a.SABRA

## 2023-12-26 NOTE — Telephone Encounter (Signed)
 Per Clarita Ricker [facility], I can put him on Tues 7/8 at 11a an arrive at 10a. Let me know. Thanks.  Outbound call; detailed voice message left asking if the above date for biopsy would work.

## 2023-12-29 ENCOUNTER — Telehealth: Payer: Self-pay

## 2023-12-29 ENCOUNTER — Encounter: Payer: Self-pay | Admitting: Oncology

## 2023-12-29 NOTE — Telephone Encounter (Signed)
 Per [name] [name] biopsy rescheduled to [facility] 7/9 appointment 11am arrival time 10am due to the CT Lab has blocked their room on 7/8 until noon. There is no way of knowing that until you go in to schedule a patient an it stops you.[name] [name]  No further follow up needed at this time.

## 2023-12-29 NOTE — Telephone Encounter (Signed)
 Per secure chat with Clarita Ricker Walterine Bence, I am so sorry, but the CT Lab has blocked their room on 7/8 until noon. There is no way of knowing that until you go in to schedule a patient an it stops you. Do you think he would come on Wed 7/9 at the same time? So sorry!! .  Outbound call; new proposed date Wed 7/9 11a procedure and arrive at 10a.  Patient agreed to new proposed date / time; no questions / concerns at this time.

## 2024-01-01 ENCOUNTER — Ambulatory Visit: Admitting: Oncology

## 2024-01-03 ENCOUNTER — Other Ambulatory Visit: Payer: Self-pay | Admitting: Cardiovascular Disease

## 2024-01-05 ENCOUNTER — Other Ambulatory Visit: Payer: Self-pay | Admitting: Radiology

## 2024-01-05 DIAGNOSIS — R599 Enlarged lymph nodes, unspecified: Secondary | ICD-10-CM

## 2024-01-06 NOTE — H&P (Signed)
 Chief Complaint: Patient was seen in consultation today for intra-abdominal lymphadenopathy.   Referring Physician(s): Melanee Annah BROCKS  Supervising Physician: Philip Cornet  Patient Status: [facility] - Out-pt  History of Present Illness: [name] [name] is a 78 y.o. male with a medical history significant for anxiety, atrial fibrillation (Eliquis ), BPH, CAD, DM, elevated WBC, iron  deficiency and intra-abdominal lymphadenopathy first identified January 2024. He has been followed by Hematology/Oncology for several years and his intra-abdominal lymphadenopathy has been followed with surveillance imaging. Imaging had consistently shown stable findings. A PET scan was performed 12/17/23 and this showed low-level uptake in pelvic lymph nodes. The patient's oncology team recommended obtaining a tissue sample for further work up.    Interventional Radiology has been asked to evaluate this patient for an image-guided biopsy of the left external iliac lymph node. Imaging reviewed and procedure approved by Dr. Luverne.   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   a.) TTE 09/26/2022: asc Ao 36 mm, Ao root 39 mm   Atrial fibrillation (HCC) 12/28/2020   a.) CHA2DS2-VASc = 5 (age x2, HTN, vascular disease history, T2DM) as of 07/29/2023; b.) s/p DCCV 02/19/2021  --> 200 J x1 -> NSR; c.) cardiac rate/rhythm maintained on oral flecainide  + metoprolol  succinate; chronically anticoagulated using apixaban    B12 deficiency    Bilateral adrenal adenomas    BPH (benign prostatic hyperplasia)    CAD (coronary artery disease)    Cholelithiasis    DDD (degenerative disc disease), lumbar    Diastolic dysfunction 09/26/2019   a.) TTE 09/26/2019: EF 60-65%, no RWMAs, G1DD, mild LAE, norm RVSF, asc Ao 36 mm, Ao root 39 mm   Diverticulosis    Elevated platelet count    Hemorrhoids    HLD  (hyperlipidemia)    HOH (hard of hearing); wears BILATERAL hearing aids    Hypertension    IDA (iron  deficiency anemia)    Left hydrocele    Left inguinal hernia    On apixaban  therapy    OSA on CPAP    Pelvic lymphadenopathy    PSVT (paroxysmal supraventricular tachycardia) (HCC) 03/26/2021   a.) Zio 03/26/2021: 4 beat run at max rate 96 bpm   PVC's (premature ventricular contractions)    a.) Zio 03/26/2021: frequent (9.6% burden)   Renal cyst, left    Spinal stenosis    Status post bilateral cataract extraction 2020   T2DM (type 2 diabetes mellitus) (HCC)     Past Surgical History:  Procedure Laterality Date   CARDIOVERSION N/A 02/19/2021   Procedure: CARDIOVERSION;  Surgeon: Darron Deatrice LABOR, MD;  Location: [facility] ORS;  Service: Cardiovascular;  Laterality: N/A;   CATARACT EXTRACTION W/PHACO Left 07/14/2018   Procedure: CATARACT EXTRACTION PHACO AND INTRAOCULAR LENS PLACEMENT (IOC) LEFT, DIABETIC;  Surgeon: Jaye Fallow, MD;  Location: [facility] ORS;  Service: Ophthalmology;  Laterality: Left;  US  00:50 CDE 8.93 Fluid pack lot # [id number]   CATARACT EXTRACTION W/PHACO Right 09/01/2018   Procedure: CATARACT EXTRACTION PHACO AND INTRAOCULAR LENS PLACEMENT (IOC)-RIGHT, DIABETIC;  Surgeon: Jaye Fallow, MD;  Location: [facility] ORS;  Service: Ophthalmology;  Laterality: Right;  US  00:50.9 CDE 6.80 Fluid Pack Lot # [id number]     COLONOSCOPY     CYSTOSCOPY WITH INSERTION OF UROLIFT     CYSTOSCOPY WITH INSERTION OF UROLIFT  08/2018   ESOPHAGOGASTRODUODENOSCOPY (EGD) WITH PROPOFOL  N/A  03/17/2019   Procedure: ESOPHAGOGASTRODUODENOSCOPY (EGD) WITH PROPOFOL ;  Surgeon: Toledo, Ladell POUR, MD;  Location: [facility];  Service: Gastroenterology;  Laterality: N/A;   REVERSE SHOULDER ARTHROPLASTY Right 07/31/2023   Procedure: REVERSE SHOULDER ARTHROPLASTY;  Surgeon: Edie Norleen PARAS, MD;  Location: [facility] OR[facility];  Service: Orthopedics;  Laterality: Right;   TOTAL HIP ARTHROPLASTY Left 02/23/2020    Procedure: TOTAL HIP ARTHROPLASTY;  Surgeon: Mardee Lynwood SQUIBB, MD;  Location: [facility] OR[facility];  Service: Orthopedics;  Laterality: Left;   TOTAL HIP ARTHROPLASTY Right 01/10/2021   Procedure: TOTAL HIP ARTHROPLASTY;  Surgeon: Mardee Lynwood SQUIBB, MD;  Location: [facility] ORS;  Service: Orthopedics;  Laterality: Right;   TOTAL KNEE ARTHROPLASTY Bilateral 2014   Total knee replacement.    Allergies: Patient has no known allergies.  Medications: Prior to Admission medications   Medication Sig Start Date End Date Taking? Authorizing Provider  acetaminophen  (TYLENOL ) 650 MG CR tablet Take 1,300 mg by mouth every 8 (eight) hours as needed for pain.    [provider]  ALPRAZolam  (XANAX ) 0.5 MG tablet Take 1 mg by mouth at bedtime as needed for sleep. 11/09/19   [provider]  amLODipine  (NORVASC ) 5 MG tablet Take 5 mg by mouth daily. 08/13/23   [provider]  atorvastatin  (LIPITOR) 40 MG tablet TAKE 1 TABLET BY MOUTH DAILY 11/03/23   Darron Deatrice LABOR, MD  benazepril -hydrochlorthiazide (LOTENSIN  HCT) 20-12.5 MG tablet Take 1 tablet by mouth in the morning.    [provider]  Cyanocobalamin  (VITAMIN B-12) 5000 MCG TBDP Take 10,000 mcg by mouth every evening.    [provider]  ELIQUIS  5 MG TABS tablet TAKE 1 TABLET BY MOUTH 2 TIMES DAILY. 08/28/23   Darron Deatrice LABOR, MD  ferrous sulfate  325 (65 FE) MG EC tablet Take 325 mg by mouth every evening.    [provider]  flecainide  (TAMBOCOR ) 50 MG tablet TAKE ONE TABLET BY MOUTH TWICE DAILY 01/06/24   Darron Deatrice LABOR, MD  GEMTESA  75 MG TABS TAKE 1 TABLET BY MOUTH ONCE DAILY 12/05/23   Stoioff, Glendia BROCKS, MD  glimepiride  (AMARYL ) 1 MG tablet Take 1 mg by mouth daily with breakfast.    [provider]  JARDIANCE 25 MG TABS tablet Take 25 mg by mouth in the morning. 06/24/23   [provider]  metFORMIN  (GLUCOPHAGE -XR) 500 MG 24 hr tablet Take 1,000 mg by mouth 2 (two) times daily with a meal. 04/17/21    [provider]  metoprolol  succinate (TOPROL -XL) 25 MG 24 hr tablet TAKE 1 TABLET BY MOUTH DAILY. 11/03/23   Darron Deatrice LABOR, MD  naphazoline-glycerin  (CLEAR EYES REDNESS) 0.012-0.25 % SOLN Place 1-2 drops into both eyes 4 (four) times daily as needed (dry/irritated eyes.).    [provider]  OZEMPIC , 2 MG/DOSE, 8 MG/3ML SOPN Inject 2 mg into the skin every Thursday at 6pm. 06/27/23   [provider]  sertraline  (ZOLOFT ) 50 MG tablet Take 50 mg by mouth in the morning.    [provider]  silodosin  (RAPAFLO ) 8 MG CAPS capsule TAKE 1 CAPSULE BY MOUTH ONCE DAILY WITH BREAKFAST 11/21/23   Stoioff, Scott C, MD  zinc gluconate 50 MG tablet Take 50 mg by mouth every evening.    [provider]     Family History  Problem Relation Age of Onset   Diabetes Mother    Alzheimer's disease Mother    Colon cancer Sister     Social History   Socioeconomic History  Marital status: Married    Spouse name: [name]   Number of children: 3   Years of education: Not on file   Highest education level: Not on file  Occupational History   Occupation: Nurse, adult, Personnel officer    Comment: retired  Tobacco Use   Smoking status: Never   Smokeless tobacco: Never  Vaping Use   Vaping status: Never Used  Substance and Sexual Activity   Alcohol use: Yes    Comment: wine-occasionally Previously drank a shot of vodka in the evenings   Drug use: Never   Sexual activity: Not on file  Other Topics Concern   Not on file  Social History Narrative   ** Merged History Encounter **       Patient lives with wife.  Feels safe   Social Drivers of Corporate investment banker Strain: Low Risk  (09/12/2023)   Received from [facility]   Overall Financial Resource Strain (CARDIA)    Difficulty of Paying Living Expenses: Not hard at all  Food Insecurity: No Food Insecurity (09/12/2023)   Received from [facility]   Hunger Vital Sign     Within the past 12 months, you worried that your food would run out before you got the money to buy more.: Never true    Within the past 12 months, the food you bought just didn't last and you didn't have money to get more.: Never true  Transportation Needs: No Transportation Needs (09/12/2023)   Received from [facility] - Transportation    In the past 12 months, has lack of transportation kept you from medical appointments or from getting medications?: No    Lack of Transportation (Non-Medical): No  Physical Activity: Not on file  Stress: Not on file  Social Connections: Not on file    Review of Systems: A 12 point ROS discussed and pertinent positives are indicated in the HPI above.  All other systems are negative.  Review of Systems  Musculoskeletal:  Positive for arthralgias and myalgias.       Bilateral hip pain  Psychiatric/Behavioral:  The patient is nervous/anxious.   All other systems reviewed and are negative.   Vital Signs: BP (!) 150/89   Pulse 76   Temp 98 F (36.7 C) (Oral)   Resp 16   Ht 5' 10 (1.778 m)   Wt 248 lb 12.8 oz (112.9 kg)   SpO2 98%   BMI 35.70 kg/m   Physical Exam Constitutional:      General: He is not in acute distress.    Appearance: He is not ill-appearing.  HENT:     Mouth/Throat:     Mouth: Mucous membranes are moist.     Pharynx: Oropharynx is clear.  Cardiovascular:     Rate and Rhythm: Normal rate. Rhythm irregular.  Pulmonary:     Effort: Pulmonary effort is normal.  Abdominal:     Tenderness: There is no abdominal tenderness.  Skin:    General: Skin is warm and dry.  Neurological:     Mental Status: He is alert and oriented to person, place, and time.  Psychiatric:        Mood and Affect: Mood normal.        Behavior: Behavior normal.        Thought Content: Thought content normal.        Judgment: Judgment normal.     Imaging: NM PET Image Initial (PI) Skull Base To  Thigh Result Date:  12/18/2023 CLINICAL DATA:  Pelvic lymphadenopathy EXAM: NUCLEAR MEDICINE PET SKULL BASE TO THIGH TECHNIQUE: 12.29 mCi F-18 FDG was injected intravenously. Full-ring PET imaging was performed from the skull base to thigh after the radiotracer. CT data was obtained and used for attenuation correction and anatomic localization. Fasting blood glucose: 144 mg/dl COMPARISON:  Upper abdomen pelvis CT 11/14/2023. FINDINGS: Mediastinal blood pool activity: SUV max 2.9 Liver activity: SUV max 3.3 NECK: No specific abnormal uptake seen above blood pool in the neck including along lymph node change of the submandibular, posterior triangle or internal jugular region. There is symmetric uptake of the visualized intracranial compartment. Incidental CT findings: The parotid glands, submandibular glands and thyroid  glands unremarkable. Visualized paranasal sinuses and mastoid air cells are clear. Scattered vascular calcifications are seen. CHEST: There is no specific abnormal uptake above blood pool in the axillary regions, hilum or mediastinum. No abnormal lung uptake. Incidental CT findings: The thoracic aorta is normal course and caliber. Scattered vascular calcifications. Coronary artery calcifications are seen. Trace pericardial fluid. Slightly patulous thoracic esophagus. Breathing motion. No consolidation, pneumothorax or effusion. No edema. ABDOMEN/PELVIS: There is physiologic distribution radiotracer along the parenchymal organs, bowel and renal collecting systems. Nodular thickening again seen of both adrenal glands. Hounsfield unit on the left of 8 and right -5 consistent with adenomas. Low level uptake associated. The left external iliac chain lymph nodes are again identified. The dominant focus previously measured 2.4 x 1.7 cm and is similar on today's examination on image 129. Uptake within this node has maximum SUV 4.9. Mildly hypermetabolic. Additional areas along the left common iliac are also stable on image 114.  This node has uptake of maximum SUV 4.7. Incidental CT findings: On this limited contrast examination, there is a gallstone. Gallbladder is nondilated. The liver, spleen, pancreas are unremarkable. No abnormal calcifications seen within either kidney nor along the course of either ureter. Large exophytic left-sided renal cyst again identified measuring up to 10 cm. Normal caliber aorta and IVC with some scattered vascular calcifications. Stomach and small bowel are nondilated. Large bowel has a normal course and caliber. Scattered colonic stool. Scattered diffuse colonic diverticulosis. Normal appendix. Nonspecific perinephric stranding. Fat containing left inguinal hernia. Left scrotal hydrocele. Please correlate with symptoms. Enlarged prostate. Wall thickening of the bladder. SKELETON: No specific abnormal uptake along the visualized osseous structures. Incidental CT findings: Station hardware from right shoulder arthroplasty and bilateral hip arthroplasties. Diffuse degenerative changes are identified. Significant specific spine degenerative changes. There is curvature of the spine as well. Multilevel stenosis in the lumbar region and cervical regions. IMPRESSION: Pelvic lymph node enlargement is again identified. These have low-level uptake above blood pool. This would have a broad differential. Please correlate for any history of infectious or inflammatory process. As he has been present since at least January 2024 could consider continued short-term surveillance. There is more clinical concern tissue sampling could be considered. Colonic diverticulosis. Bilateral adrenal adenomas. Left-sided scrotal hydrocele with a left-sided inguinal fat containing hernia. Electronically Signed   By: Ranell Bring M.D.   On: 12/18/2023 10:05    Labs:  CBC: Recent Labs    07/24/23 1232 11/28/23 0943 01/07/24 1029  WBC 13.0* 14.3* 9.9  HGB 12.4* 10.8* 11.8*  HCT 37.1* 33.8* 37.2*  PLT 499* 633* 497*     COAGS: No results for input(s): INR, APTT in the last 8760 hours.  BMP: Recent Labs    07/24/23 1232 11/14/23 0946 11/28/23 0943  NA 130*  --  128*  K 4.0  --  4.6  CL 93*  --  90*  CO2 25  --  26  GLUCOSE 107*  --  212*  BUN 18  --  11  CALCIUM  9.1  --  8.7*  CREATININE 0.74 0.90 0.77  GFRNONAA >60  --  >60    LIVER FUNCTION TESTS: Recent Labs    07/24/23 1232 11/28/23 0943  BILITOT 0.6 0.4  AST 19 13*  ALT 18 18  ALKPHOS 84 85  PROT 8.0 8.0  ALBUMIN 3.5 3.0*    TUMOR MARKERS: No results for input(s): AFPTM, CEA, CA199, CHROMGRNA in the last 8760 hours.  Assessment and Plan:  Intra-abdominal lymphadenopathy of uncertain significance: Wadie MOHR Pindell, 78 year old male, presents today to the [facility] Interventional Radiology department for an image-guided pelvic lymph node biopsy. Dr. Philip reviewed the patient's imaging and noted that the patient also has a left hip effusion. Dr. Philip has recommended aspiration and the patient was agreeable to this.   Risks and benefits of these procedures were discussed with the patient and/or patient's family including, but not limited to bleeding, infection, damage to adjacent structures or low yield requiring additional tests.  All of the questions were answered and there is agreement to proceed. He has been NPO. His Eliquis  has been held the appropriate length of time.   Consent signed and in chart.  Thank you for this interesting consult.  I greatly enjoyed meeting Jakorian Marengo Holshouser and look forward to participating in their care.  A copy of this report was sent to the requesting provider on this date.  Electronically Signed: Warren Dais, AGACNP-BC 01/07/2024, 11:04 AM   I spent a total of  30 Minutes   in face to face in clinical consultation, greater than 50% of which was counseling/coordinating care for intra-abdominal lymphadenopathy.

## 2024-01-06 NOTE — Progress Notes (Addendum)
 Patient for CT Abd Mass Biopsy on Wed 01/07/24, I called and spoke with the patient on the phone and gave pre-procedure instructions. Pt was made aware to be here at 10a, last dose of Eliquis  was Thurs 01/01/24, NPO after MN prior to procedure as well as driver post procedure/recovery/discharge. Pt stated understanding.  Called 01/06/24

## 2024-01-07 ENCOUNTER — Ambulatory Visit
Admission: RE | Admit: 2024-01-07 | Discharge: 2024-01-07 | Disposition: A | Discharge status: Home / Self Care | Source: Ambulatory Visit | Attending: Oncology | Admitting: Oncology

## 2024-01-07 ENCOUNTER — Ambulatory Visit
Admission: RE | Admit: 2024-01-07 | Discharge: 2024-01-07 | Disposition: A | Discharge status: Home / Self Care | Source: Ambulatory Visit | Attending: Diagnostic Radiology | Admitting: Diagnostic Radiology

## 2024-01-07 ENCOUNTER — Telehealth: Payer: Self-pay | Admitting: *Deleted

## 2024-01-07 ENCOUNTER — Other Ambulatory Visit: Payer: Self-pay

## 2024-01-07 DIAGNOSIS — R1900 Intra-abdominal and pelvic swelling, mass and lump, unspecified site: Secondary | ICD-10-CM | POA: Diagnosis not present

## 2024-01-07 DIAGNOSIS — Z7901 Long term (current) use of anticoagulants: Secondary | ICD-10-CM | POA: Diagnosis not present

## 2024-01-07 DIAGNOSIS — E119 Type 2 diabetes mellitus without complications: Secondary | ICD-10-CM | POA: Insufficient documentation

## 2024-01-07 DIAGNOSIS — N400 Enlarged prostate without lower urinary tract symptoms: Secondary | ICD-10-CM | POA: Insufficient documentation

## 2024-01-07 DIAGNOSIS — Z96642 Presence of left artificial hip joint: Secondary | ICD-10-CM | POA: Insufficient documentation

## 2024-01-07 DIAGNOSIS — R599 Enlarged lymph nodes, unspecified: Secondary | ICD-10-CM | POA: Insufficient documentation

## 2024-01-07 DIAGNOSIS — Z01812 Encounter for preprocedural laboratory examination: Secondary | ICD-10-CM | POA: Diagnosis present

## 2024-01-07 DIAGNOSIS — R590 Localized enlarged lymph nodes: Secondary | ICD-10-CM | POA: Insufficient documentation

## 2024-01-07 DIAGNOSIS — F419 Anxiety disorder, unspecified: Secondary | ICD-10-CM | POA: Diagnosis not present

## 2024-01-07 DIAGNOSIS — I4891 Unspecified atrial fibrillation: Secondary | ICD-10-CM | POA: Insufficient documentation

## 2024-01-07 DIAGNOSIS — M25452 Effusion, left hip: Secondary | ICD-10-CM | POA: Diagnosis not present

## 2024-01-07 DIAGNOSIS — I2510 Atherosclerotic heart disease of native coronary artery without angina pectoris: Secondary | ICD-10-CM | POA: Insufficient documentation

## 2024-01-07 DIAGNOSIS — Z7985 Long-term (current) use of injectable non-insulin antidiabetic drugs: Secondary | ICD-10-CM | POA: Insufficient documentation

## 2024-01-07 LAB — CBC
HCT: 37.2 % — ABNORMAL LOW (ref 39.0–52.0)
Hemoglobin: 11.8 g/dL — ABNORMAL LOW (ref 13.0–17.0)
MCH: 27.8 pg (ref 26.0–34.0)
MCHC: 31.7 g/dL (ref 30.0–36.0)
MCV: 87.7 fL (ref 80.0–100.0)
Platelets: 497 K/uL — ABNORMAL HIGH (ref 150–400)
RBC: 4.24 MIL/uL (ref 4.22–5.81)
RDW: 15.6 % — ABNORMAL HIGH (ref 11.5–15.5)
WBC: 9.9 K/uL (ref 4.0–10.5)
nRBC: 0 % (ref 0.0–0.2)

## 2024-01-07 LAB — BODY FLUID CELL COUNT WITH DIFFERENTIAL
Eos, Fluid: 0 %
Lymphs, Fluid: 2 %
Monocyte-Macrophage-Serous Fluid: 5 %
Neutrophil Count, Fluid: 93 %
Total Nucleated Cell Count, Fluid: 38283 uL

## 2024-01-07 LAB — PROTIME-INR
INR: 1 (ref 0.8–1.2)
Prothrombin Time: 14 s (ref 11.4–15.2)

## 2024-01-07 LAB — SYNOVIAL FLUID, CRYSTAL: Crystals, Fluid: NONE SEEN

## 2024-01-07 LAB — GLUCOSE, CAPILLARY: Glucose-Capillary: 122 mg/dL — ABNORMAL HIGH (ref 70–99)

## 2024-01-07 MED ORDER — SODIUM CHLORIDE 0.9 % IV SOLN: INTRAVENOUS | Status: DC

## 2024-01-07 MED ORDER — MIDAZOLAM HCL 2 MG/2ML IJ SOLN
INTRAMUSCULAR | Status: AC | PRN
  Administered 2024-01-07: 1 mg via INTRAVENOUS

## 2024-01-07 MED ORDER — MIDAZOLAM HCL 2 MG/2ML IJ SOLN
INTRAMUSCULAR | Status: AC
  Filled 2024-01-07: qty 2

## 2024-01-07 MED ORDER — FENTANYL CITRATE (PF) 100 MCG/2ML IJ SOLN
INTRAMUSCULAR | Status: AC
  Filled 2024-01-07: qty 2

## 2024-01-07 MED ORDER — MIDAZOLAM HCL 5 MG/5ML IJ SOLN
INTRAMUSCULAR | Status: AC | PRN
  Administered 2024-01-07 (×2): .5 mg via INTRAVENOUS

## 2024-01-07 MED ORDER — FENTANYL CITRATE (PF) 100 MCG/2ML IJ SOLN
INTRAMUSCULAR | Status: AC | PRN
  Administered 2024-01-07 (×2): 25 ug via INTRAVENOUS
  Administered 2024-01-07: 50 ug via INTRAVENOUS

## 2024-01-07 NOTE — Telephone Encounter (Signed)
 Thank you :)

## 2024-01-07 NOTE — Progress Notes (Signed)
 Patient clinically stable post CT hip aspiration along with LN biopsy , denies complaints immediately post procedure. Received Versed  2 mg along with Fentanyl  100 mcg IV for both procedures. Report given to Ruthanna Kitty RN post procedures/specials/18

## 2024-01-07 NOTE — Telephone Encounter (Signed)
 Per Dr Melanee contacted patient PCP Dr Layman Piety and orthopedic Dr Mardee regarding patient biopsy findings today. Pt has been followed for enlarged lymph nodes and was decided in tumor board to biopsy due to elevated WBC.  Dr Philip informed Dr Melanee this morning that.  See below   Want to discuss this patient and biopsy.  He is scheduled for lymph node biopsy today.  I should be able to biopsy the enlarged left external iliac node but wanted you to know that he has an enlarging fluid collection around his left hip prothesis.  I think we should aspirate or biopsy this collection too.    It was decided to also aspirate fluid from the left hip prothesis area.   RN spoke with Cindy Nakyrien at [facility] and detailed message of above sent to both Dr Piety and Dr Mardee.  RN also stated that Dr Melanee would be unavailable and out of clinic for the next 3 weeks and wanted to be sure that all providers involved in patient care were aware of above.

## 2024-01-07 NOTE — Procedures (Signed)
 Interventional Radiology Procedure:   Indications: Enlarged left pelvic lymph nodes.  Fluid around left hip  Procedure: 1) CT guided core biopsy of left external iliac lymph node.  2) US  guided aspiration of fluid collection around left hip - trochanteric region-  Findings: Core biopsies placed in Telfa pad.  15 ml of cloudy yellow fluid removed from collection around left hip.  Complications: None     EBL: Minimal  Plan: Bedrest 2 hours and then discharge to home.   Carolan Avedisian R. Philip, MD  Pager: [phone]

## 2024-01-09 ENCOUNTER — Telehealth: Payer: Self-pay | Admitting: *Deleted

## 2024-01-09 LAB — SURGICAL PATHOLOGY

## 2024-01-09 NOTE — Telephone Encounter (Signed)
 Dr. Melanee patient - lymph node biopsy/flow cyto results are not back as of Friday 7/11. Lauren ask that we show the biopsy results to the med onc on call next week since Tinnie is not here next week. (Dr. Babara is on call next week)

## 2024-01-10 LAB — BODY FLUID CULTURE W GRAM STAIN: Culture: NO GROWTH

## 2024-01-12 LAB — COMP PANEL: LEUKEMIA/LYMPHOMA

## 2024-01-13 ENCOUNTER — Ambulatory Visit: Admitting: Physician Assistant

## 2024-01-13 NOTE — Telephone Encounter (Signed)
 Per Dr. Babara He can see Tinnie for further discussion. Biopsy did not show lymphoma.  Patient already scheduled for f/u with Lauren on 01/21/24.  Outbound call, detailed voice message left indicating the above and reminded of upcoming appointment with Lauren. Also sent a my chart message as well.

## 2024-01-17 NOTE — Progress Notes (Signed)
 Chief Complaint: Chief Complaint  Patient presents with  . Left Hip - Pain, Follow-up    Reason for Visit: The patient is a 78 y.o. male who returns today for reevaluation of his left hip. 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 He reports some mild hip pain. He is not using any ambulatory aids. He remains active but does not have a regular exercise program. He has complaints of severe fatigue which have persisted for several months.  He has been followed by Oncology for abdominal lymphadenopathy.  Plans were made for biopsy of the lymph node.  Apparently during the workup there was a fluid collection noted to the left hip.  On the day that interventional That radiology perform the lymph node biopsy, the radiologist also aspirated the fluid collection in the trochanteric area of the left hip.  Lab analysis of the aspiration from 01/07/2024 demonstrated no crystals, turbid appearance, 38,283 nucleated cells (93% neutrophils).  Culture was no growth at 3 days.  Medications: Current Outpatient Medications  Medication Sig Dispense Refill  . acetaminophen  (TYLENOL ) 650 MG ER tablet Take 1,300 mg by mouth every 8 (eight) hours as needed    . ALPRAZolam  (XANAX ) 0.5 MG tablet TAKE ONE TABLET TWICE A DAY AS NEEDED FOR SLEEP 60 tablet 3  . amLODIPine  (NORVASC ) 5 MG tablet TAKE 1 TABLET BY MOUTH DAILY 90 tablet 3  . amoxicillin (AMOXIL) 500 MG capsule 500 mg 4 TABLETS ONE ONE PRIOR TO DENTAL PROCEDURE    . apixaban  (ELIQUIS ) 5 mg tablet Take 5 mg by mouth 2 (two) times daily    . atorvastatin  (LIPITOR) 40 MG tablet Take 1 tablet by mouth once daily    . benazepril -hydrochlorthiazide (LOTENSIN  HCT) 20-12.5 mg tablet take 1 tablet by mouth daily 100 tablet 3  . blood-glucose sensor (DEXCOM G7 SENSOR) Devi Use 1 each every 10 (ten) days 9 each 3  . chlorhexidine  (HIBICLENS ) 4 % external wash Apply 1 Application topically    . cyanocobalamin , vitamin B-12, 5,000 mcg TbDL Take 10,000 mcg  by mouth once daily    . ferrous sulfate  325 (65 FE) MG EC tablet Take 325 mg by mouth once daily    . flecainide  (TAMBOCOR ) 50 MG tablet Take 1 tablet by mouth 2 (two) times daily    . glimepiride  (AMARYL ) 1 MG tablet TAKE 1 TABLET BY MOUTH EVERY MORNING WITH BREAKFAST. 90 tablet 1  . JARDIANCE 25 mg tablet TAKE ONE TABLET BY MOUTH EVERY DAY 90 tablet 3  . melatonin 2.5 mg Chew Take 2.5 mg by mouth at bedtime    . metFORMIN  (GLUCOPHAGE -XR) 500 MG XR tablet Take 2 tablets (1,000 mg total) by mouth 2 (two) times daily with meals 400 tablet 3  . metoprolol  succinate (TOPROL -XL) 25 MG XL tablet Take 1 tablet by mouth once daily    . naphazoline-glycerin  0.012-0.25 % Drop Apply 1-2 drops to eye 4 (four) times daily as needed ((dry/irritated eyes.).)    . OZEMPIC  2 mg/dose (8 mg/3 mL) pen injector INJECT 2 MG TOTAL SUBCUTANEOUSLY ONCE A WEEK 9 mL 1  . sertraline  (ZOLOFT ) 50 MG tablet Take 1.5 tablets (75 mg total) by mouth once daily Patient to take 1 and a half tablets daily 135 tablet 1  . silodosin  (RAPAFLO ) 8 mg capsule Take 1 capsule by mouth daily with breakfast    . vibegron  75 mg Tab Take 75 mg by mouth once daily    . vit B-comp w-Fe,Ca,FA1mg  (IRON -VITAMINS ORAL)  Take 1 oz by mouth once daily    . zinc gluconate 50 mg tablet Take 50 mg by mouth once daily        No current facility-administered medications for this visit.    Allergies: No Known Allergies  Past Medical History: Past Medical History:  Diagnosis Date  . Anemia 06/08/2015   Post wu and ? Iron , colon due 2020  . Anxiety states   . Benign non-nodular prostatic hyperplasia with lower urinary tract symptoms 06/08/2015  . Diabetes mellitus type 2, controlled (CMS/HHS-HCC) 06/08/2015  . History of blood transfusion   . Hypertension goal BP (blood pressure) < 140/90 06/08/2015   Left arm is higher  . OSA on CPAP 05/09/2020  . Primary insomnia 06/08/2015   Needing prn ambien   . Primary osteoarthritis of right hip  10/15/2020  . Pure hypercholesterolemia     Past Surgical History: Past Surgical History:  Procedure Laterality Date  . 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 Left total knee arthroplasty using computer-assisted navigation  10/26/2012   Dr Mardee  . EGD  03/17/2019   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  . Left total hip arthroplasty  02/23/2020   Dr Mardee  . Right total hip arthroplasty  01/10/2021   Dr Mardee  . RT REVERSE TSA Right 07/31/2023   Dr. Edie  . CATARACT EXTRACTION    . Cosmetic face surgery for scar    . KNEE ARTHROSCOPY    . PROSTATE SURGERY     urolift    Social History: Social History   Socioeconomic History  . Marital status: Married    Spouse name: Leeroy  . Number of children: 3  . Years of education: 35  Occupational History  . Occupation: Retired- Engineer, building services  Tobacco Use  . Smoking status: Never  . Smokeless tobacco: Never  Vaping Use  . Vaping status: Never Used  Substance and Sexual Activity  . Alcohol use: Yes    Alcohol/week: 2.0 standard drinks of alcohol    Types: 1 Shots of liquor, 1 Standard drinks or equivalent per week    Comment: Moderate  . Drug use: No  . Sexual activity: Not Currently    Partners: Female   Social Drivers of Health   Financial Resource Strain: Low Risk  (01/10/2024)   Overall Financial Resource Strain (CARDIA)   . Difficulty of Paying Living Expenses: Not hard at all  Food Insecurity: No Food Insecurity (01/10/2024)   Hunger Vital Sign   . Worried About Programme researcher, broadcasting/film/video in the Last Year: Never true   . Ran Out of Food in the Last Year: Never true  Transportation Needs: No Transportation Needs (01/10/2024)   PRAPARE - Transportation   . Lack of Transportation (Medical): No   . Lack of Transportation (Non-Medical): No  Housing Stability: Low Risk  (01/10/2024)   Housing Stability Vital Sign   . Unable to Pay  for Housing in the Last Year: No   . Number of Times Moved in the Last Year: 0   . Homeless in the Last Year: No    Family History: Family History  Problem Relation Name Age of Onset  . Diabetes type II Mother Insurance risk surveyor   . Hip fracture Mother Jaimeson Gopal   . Alzheimer's disease Mother Jamail Cullers     Review of Systems: A comprehensive 14 point ROS was performed, reviewed, and the pertinent orthopaedic  findings are documented in the HPI.  Exam BP 122/78   Ht 177.8 cm (5' 10)   Wt (!) 113.6 kg (250 lb 6.4 oz)   BMI 35.93 kg/m   General:  Well-developed, well-nourished male seen in no acute distress.  Antalgic gait without evidence of significant abductor lurch.  Left Hip:     Soft tissue swelling: Negative    Erythema:  Negative    Crepitance:  Negative    Tenderness:  Greater trochanter is nontender to palpation. No pain is elicited by axial compression or extremes of rotation.    Atrophy:  No atrophy. Good hip flexor and abductor strength.    Incision:  Well healed    Range of Motion: Good active range of motion.  Vascular: Peripheral pulses are palpable.  Good capillary refill.  No gross pretibial or ankle edema. Homans test is negative.  Neurologic:  Awake, alert, and oriented.  Sensory function is intact to pinprick and light touch.   Motor strength is judged to be 5/5.   Motor coordination is grossly within normal limits.   No apparent clonus. No tremor.    Radiographs: I ordered and interpreted AP pelvis, AP, and lateral radiographs of the left hip that were obtained in the office today. Total hip implants are in good position. No evidence of loosening or wear. There is a radiolucency along the lateral aspect of the femoral stem extending from the shoulder of the implant to a level just distal to the lesser trochanter. No significant heterotopic ossification. No evidence of fracture or dislocation.      Impression: Left total hip arthroplasty  Plan:   The  findings were discussed in detail with the patient. He is scheduled to see Dr. Epifanio (Infectious Disease) this afternoon.  I have recommended that we repeat CRP and ESR.  I have also recommended that we obtain serum cobalt and chromium levels.  I will try to get clarification from the radiologist about the location of the fluid collection. A MARS MRI would be helpful to better evaluate the left hip.  I will discuss the case with Dr. Epifanio  I spent a total of 30 minutes in both face-to-face and non-face-to-face activities, excluding procedures performed, for this visit on the date of this encounter.  SPECIAL EQUIPMENT: None SPECIAL STUDIES: MARS MRI of the left hip ACTIVITIES:  As tolerated. WORK STATUS: Not applicable. THERAPY: Home exercise program as reviewed with the patient. MEDICATIONS: Requested Prescriptions    No prescriptions requested or ordered in this encounter   FOLLOW-UP: Return for follow-up after the scheduled tests.    James P. Hooten, Jr., M.D.   This note was generated in part with voice recognition software and I apologize for any typographical errors that were not detected and corrected.

## 2024-01-21 ENCOUNTER — Inpatient Hospital Stay: Attending: Oncology | Admitting: Nurse Practitioner

## 2024-01-21 ENCOUNTER — Encounter: Payer: Self-pay | Admitting: Nurse Practitioner

## 2024-01-21 VITALS — BP 115/69 | HR 76 | Temp 97.5°F | Resp 16 | Wt 253.0 lb

## 2024-01-21 DIAGNOSIS — G8929 Other chronic pain: Secondary | ICD-10-CM | POA: Insufficient documentation

## 2024-01-21 DIAGNOSIS — Z7901 Long term (current) use of anticoagulants: Secondary | ICD-10-CM | POA: Insufficient documentation

## 2024-01-21 DIAGNOSIS — Z800 Family history of malignant neoplasm of digestive organs: Secondary | ICD-10-CM | POA: Insufficient documentation

## 2024-01-21 DIAGNOSIS — Z818 Family history of other mental and behavioral disorders: Secondary | ICD-10-CM | POA: Insufficient documentation

## 2024-01-21 DIAGNOSIS — R590 Localized enlarged lymph nodes: Secondary | ICD-10-CM | POA: Diagnosis present

## 2024-01-21 DIAGNOSIS — Z860101 Personal history of adenomatous and serrated colon polyps: Secondary | ICD-10-CM | POA: Diagnosis not present

## 2024-01-21 DIAGNOSIS — D72829 Elevated white blood cell count, unspecified: Secondary | ICD-10-CM | POA: Insufficient documentation

## 2024-01-21 DIAGNOSIS — E785 Hyperlipidemia, unspecified: Secondary | ICD-10-CM | POA: Diagnosis not present

## 2024-01-21 DIAGNOSIS — D649 Anemia, unspecified: Secondary | ICD-10-CM

## 2024-01-21 DIAGNOSIS — E119 Type 2 diabetes mellitus without complications: Secondary | ICD-10-CM | POA: Diagnosis not present

## 2024-01-21 DIAGNOSIS — Z833 Family history of diabetes mellitus: Secondary | ICD-10-CM | POA: Insufficient documentation

## 2024-01-21 DIAGNOSIS — R599 Enlarged lymph nodes, unspecified: Secondary | ICD-10-CM | POA: Diagnosis not present

## 2024-01-21 DIAGNOSIS — M545 Low back pain, unspecified: Secondary | ICD-10-CM | POA: Diagnosis not present

## 2024-01-21 DIAGNOSIS — Z79899 Other long term (current) drug therapy: Secondary | ICD-10-CM | POA: Diagnosis not present

## 2024-01-21 DIAGNOSIS — D75839 Thrombocytosis, unspecified: Secondary | ICD-10-CM | POA: Diagnosis not present

## 2024-01-21 DIAGNOSIS — Z8719 Personal history of other diseases of the digestive system: Secondary | ICD-10-CM | POA: Insufficient documentation

## 2024-01-21 NOTE — Progress Notes (Signed)
Pt in for follow up and biopsy results.  

## 2024-01-21 NOTE — Progress Notes (Signed)
 Hematology/Oncology Consult Note [facility]  [phone] [phone]  Patient Care Team: Lenon Layman ORN, MD as PCP - General (Internal Medicine) Darron Deatrice LABOR, MD as PCP - Cardiology (Cardiology) Hooten, Lynwood SQUIBB, MD (Orthopedic Surgery) Twana Jeneal DASEN, MD (Hematology and Oncology) Melanee Annah BROCKS, MD as Consulting Physician (Oncology)   Name of the patient: [name] [name]  [id number]  October 17, 1945   Date of visit: 01/21/24  Diagnosis- intra-badominal adenopathy likely benign  Chief complaint/ Reason for visit- discuss CT scan results and further management  Heme/Onc History: Patient is a 78 year old male who has seen me in the past for iron  deficiency. More recently patient underwent MRI lumbar spine in January 2024 for symptoms of low back pain which showed evidence of spinal stenosis but incidentally showed a prominent periaortic lymph node measuring 1.5 cm. This was followed by a CT abdomen and pelvis with contrast which showed bilateral adrenal adenomas. He was also noted to have external iliac lymphadenopathy measuring 2.4 x 1.9 cm and a common iliac lymph node measuring 3.3 x 1.5 cm. Masslike area identified in the scrotum of on the left side measuring 6.6 x 6.1 cm. 11 mm cystic lesion in the uncinate process of pancreas for which MRI was recommended in 2 years.   Interval history- Has recovered from biopsy. Fluid from hip was drained. Chronic pain. Complains of fatigue and being unable physically do do things he used to enjoy doing. No fevers, chills. Denies losing weight. Wife has been ill.   ECOG PS- 1 Pain scale- 3  Review of systems- Review of Systems  Constitutional:  Negative for chills, fever, malaise/fatigue and weight loss.  HENT:  Negative for congestion, ear discharge and nosebleeds.   Eyes:  Negative for blurred vision.  Respiratory:  Negative for cough, hemoptysis, sputum production, shortness of breath and wheezing.    Cardiovascular:  Negative for chest pain, palpitations, orthopnea and claudication.  Gastrointestinal:  Negative for abdominal pain, blood in stool, constipation, diarrhea, heartburn, melena, nausea and vomiting.  Genitourinary:  Negative for dysuria, flank pain, frequency, hematuria and urgency.  Musculoskeletal:  Positive for joint pain. Negative for back pain and myalgias.  Skin:  Negative for rash.  Neurological:  Negative for dizziness, tingling, focal weakness, seizures, weakness and headaches.  Endo/Heme/Allergies:  Does not bruise/bleed easily.  Psychiatric/Behavioral:  Negative for depression and suicidal ideas. The patient does not have insomnia.     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   a.) TTE 09/26/2022: asc Ao 36 mm, Ao root 39 mm   Atrial fibrillation (HCC) 12/28/2020   a.) CHA2DS2-VASc = 5 (age x2, HTN, vascular disease history, T2DM) as of 07/29/2023; b.) s/p DCCV 02/19/2021  --> 200 J x1 -> NSR; c.) cardiac rate/rhythm maintained on oral flecainide  + metoprolol  succinate; chronically anticoagulated using apixaban    B12 deficiency    Bilateral adrenal adenomas    BPH (benign prostatic hyperplasia)    CAD (coronary artery disease)    Cholelithiasis    DDD (degenerative disc disease), lumbar    Diastolic dysfunction 09/26/2019   a.) TTE 09/26/2019: EF 60-65%, no RWMAs, G1DD, mild LAE, norm RVSF, asc Ao 36 mm, Ao root 39 mm   Diverticulosis    Elevated platelet count    Hemorrhoids    HLD (hyperlipidemia)  HOH (hard of hearing); wears BILATERAL hearing aids    Hypertension    IDA (iron  deficiency anemia)    Left hydrocele    Left inguinal hernia    On apixaban  therapy    OSA on CPAP    Pelvic lymphadenopathy    PSVT (paroxysmal supraventricular tachycardia) (HCC) 03/26/2021   a.) Zio 03/26/2021: 4 beat run  at max rate 96 bpm   PVC's (premature ventricular contractions)    a.) Zio 03/26/2021: frequent (9.6% burden)   Renal cyst, left    Spinal stenosis    Status post bilateral cataract extraction 2020   T2DM (type 2 diabetes mellitus) (HCC)    Past Surgical History:  Procedure Laterality Date   CARDIOVERSION N/A 02/19/2021   Procedure: CARDIOVERSION;  Surgeon: Darron Deatrice LABOR, MD;  Location: [facility];  Service: Cardiovascular;  Laterality: N/A;   CATARACT EXTRACTION W/PHACO Left 07/14/2018   Procedure: CATARACT EXTRACTION PHACO AND INTRAOCULAR LENS PLACEMENT (IOC) LEFT, DIABETIC;  Surgeon: Jaye Fallow, MD;  Location: [facility];  Service: Ophthalmology;  Laterality: Left;  US  00:50 CDE 8.93 Fluid pack lot # [id number]   CATARACT EXTRACTION W/PHACO Right 09/01/2018   Procedure: CATARACT EXTRACTION PHACO AND INTRAOCULAR LENS PLACEMENT (IOC)-RIGHT, DIABETIC;  Surgeon: Jaye Fallow, MD;  Location: [facility];  Service: Ophthalmology;  Laterality: Right;  US  00:50.9 CDE 6.80 Fluid Pack Lot # [id number]     COLONOSCOPY     CYSTOSCOPY WITH INSERTION OF UROLIFT     CYSTOSCOPY WITH INSERTION OF UROLIFT  08/2018   ESOPHAGOGASTRODUODENOSCOPY (EGD) WITH PROPOFOL  N/A 03/17/2019   Procedure: ESOPHAGOGASTRODUODENOSCOPY (EGD) WITH PROPOFOL ;  Surgeon: Toledo, Ladell POUR, MD;  Location: [facility];  Service: Gastroenterology;  Laterality: N/A;   REVERSE SHOULDER ARTHROPLASTY Right 07/31/2023   Procedure: REVERSE SHOULDER ARTHROPLASTY;  Surgeon: Edie Norleen PARAS, MD;  Location: [facility];  Service: Orthopedics;  Laterality: Right;   TOTAL HIP ARTHROPLASTY Left 02/23/2020   Procedure: TOTAL HIP ARTHROPLASTY;  Surgeon: Mardee Lynwood SQUIBB, MD;  Location: [facility];  Service: Orthopedics;  Laterality: Left;   TOTAL HIP ARTHROPLASTY Right 01/10/2021   Procedure: TOTAL HIP ARTHROPLASTY;  Surgeon: Mardee Lynwood SQUIBB, MD;  Location: [facility];  Service: Orthopedics;  Laterality: Right;   TOTAL KNEE ARTHROPLASTY  Bilateral 2014   Total knee replacement.   Social History   Socioeconomic History   Marital status: Married    Spouse name: [name] [name]   Number of children: 3   Years of education: Not on file   Highest education level: Not on file  Occupational History   Occupation: Nurse, adult, Personnel officer    Comment: retired  Tobacco Use   Smoking status: Never   Smokeless tobacco: Never  Vaping Use   Vaping status: Never Used  Substance and Sexual Activity   Alcohol use: Yes    Comment: wine-occasionally Previously drank a shot of vodka in the evenings   Drug use: Never   Sexual activity: Not on file  Other Topics Concern   Not on file  Social History Narrative   ** Merged History Encounter **       Patient lives with wife.  Feels safe   Social Drivers of Corporate investment banker Strain: Low Risk  (01/10/2024)   Received from [facility]   Overall Financial Resource Strain (CARDIA)    Difficulty of Paying Living Expenses: Not hard at all  Food Insecurity: No Food Insecurity (01/10/2024)   Received from [facility]   Hunger Vital Sign  Within the past 12 months, you worried that your food would run out before you got the money to buy more.: Never true    Within the past 12 months, the food you bought just didn't last and you didn't have money to get more.: Never true  Transportation Needs: No Transportation Needs (01/10/2024)   Received from [facility] - Transportation    In the past 12 months, has lack of transportation kept you from medical appointments or from getting medications?: No    Lack of Transportation (Non-Medical): No  Physical Activity: Not on file  Stress: Not on file  Social Connections: Not on file  Intimate Partner Violence: Not on file   Family History  Problem Relation Age of Onset   Diabetes Mother    Alzheimer's disease Mother    Colon cancer Sister     Current Outpatient Medications:     acetaminophen  (TYLENOL ) 650 MG CR tablet, Take 1,300 mg by mouth every 8 (eight) hours as needed for pain., Disp: , Rfl:    ALPRAZolam  (XANAX ) 0.5 MG tablet, Take 1 mg by mouth at bedtime as needed for sleep., Disp: , Rfl:    amLODipine  (NORVASC ) 5 MG tablet, Take 5 mg by mouth daily., Disp: , Rfl:    atorvastatin  (LIPITOR) 40 MG tablet, TAKE 1 TABLET BY MOUTH DAILY, Disp: 30 tablet, Rfl: 0   benazepril -hydrochlorthiazide (LOTENSIN  HCT) 20-12.5 MG tablet, Take 1 tablet by mouth in the morning., Disp: , Rfl:    Cyanocobalamin  (VITAMIN B-12) 5000 MCG TBDP, Take 10,000 mcg by mouth every evening., Disp: , Rfl:    ELIQUIS  5 MG TABS tablet, TAKE 1 TABLET BY MOUTH 2 TIMES DAILY., Disp: 60 tablet, Rfl: 5   ferrous sulfate  325 (65 FE) MG EC tablet, Take 325 mg by mouth every evening., Disp: , Rfl:    flecainide  (TAMBOCOR ) 50 MG tablet, TAKE ONE TABLET BY MOUTH TWICE DAILY, Disp: 180 tablet, Rfl: 1   GEMTESA  75 MG TABS, TAKE 1 TABLET BY MOUTH ONCE DAILY, Disp: 30 tablet, Rfl: 11   glimepiride  (AMARYL ) 1 MG tablet, Take 1 mg by mouth daily with breakfast., Disp: , Rfl:    JARDIANCE 25 MG TABS tablet, Take 25 mg by mouth in the morning., Disp: , Rfl:    metFORMIN  (GLUCOPHAGE -XR) 500 MG 24 hr tablet, Take 1,000 mg by mouth 2 (two) times daily with a meal., Disp: , Rfl:    metoprolol  succinate (TOPROL -XL) 25 MG 24 hr tablet, TAKE 1 TABLET BY MOUTH DAILY., Disp: 30 tablet, Rfl: 0   naphazoline-glycerin  (CLEAR EYES REDNESS) 0.012-0.25 % SOLN, Place 1-2 drops into both eyes 4 (four) times daily as needed (dry/irritated eyes.)., Disp: , Rfl:    OZEMPIC , 2 MG/DOSE, 8 MG/3ML SOPN, Inject 2 mg into the skin every Thursday at 6pm., Disp: , Rfl:    sertraline  (ZOLOFT ) 50 MG tablet, Take 50 mg by mouth in the morning., Disp: , Rfl:    silodosin  (RAPAFLO ) 8 MG CAPS capsule, TAKE 1 CAPSULE BY MOUTH ONCE DAILY WITH BREAKFAST, Disp: 30 capsule, Rfl: 6   zinc gluconate 50 MG tablet, Take 50 mg by mouth every evening., Disp: ,  Rfl:   Physical exam:  Vitals:   01/21/24 1037  BP: 115/69  Pulse: 76  Resp: 16  Temp: (!) 97.5 F (36.4 C)  TempSrc: Tympanic  SpO2: 100%  Weight: 253 lb (114.8 kg)   Physical Exam Cardiovascular:     Rate and Rhythm: Normal rate  and regular rhythm.     Heart sounds: Normal heart sounds.  Pulmonary:     Effort: Pulmonary effort is normal.     Breath sounds: Normal breath sounds.  Abdominal:     General: Bowel sounds are normal.     Palpations: Abdomen is soft.  Skin:    General: Skin is warm and dry.  Neurological:     Mental Status: He is alert and oriented to person, place, and time.     I have personally reviewed labs listed below:    Latest Ref Rng & Units 11/28/2023    9:43 AM  CMP  Glucose 70 - 99 mg/dL 787   BUN 8 - 23 mg/dL 11   Creatinine 9.38 - 1.24 mg/dL 9.22   Sodium 864 - 854 mmol/L 128   Potassium 3.5 - 5.1 mmol/L 4.6   Chloride 98 - 111 mmol/L 90   CO2 22 - 32 mmol/L 26   Calcium  8.9 - 10.3 mg/dL 8.7   Total Protein 6.5 - 8.1 g/dL 8.0   Total Bilirubin 0.0 - 1.2 mg/dL 0.4   Alkaline Phos 38 - 126 U/L 85   AST 15 - 41 U/L 13   ALT 0 - 44 U/L 18       Latest Ref Rng & Units 01/07/2024   10:29 AM  CBC  WBC 4.0 - 10.5 K/uL 9.9   Hemoglobin 13.0 - 17.0 g/dL 88.1   Hematocrit 60.9 - 52.0 % 37.2   Platelets 150 - 400 K/uL 497    I have personally reviewed Radiology images listed below: IR US  DRAIN/INJ MAJOR JOINT/BURSA Result Date: 01/07/2024 INDICATION: 78 year old with a large fluid collection along the lateral aspect of the left hip prosthesis. Findings are suggestive for a trochanteric bursal fluid collection. EXAM: ULTRASOUND-GUIDED ASPIRATION OF LEFT HIP TROCHANTERIC BURSAL FLUID COLLECTION MEDICATIONS: Moderate sedation ANESTHESIA/SEDATION: Moderate (conscious) sedation was employed during this procedure. A total of Versed  0.5 mg and Fentanyl  25 mcg was administered intravenously by the radiology nurse. Total intra-service moderate Sedation  Time: 20 minutes. The patient's level of consciousness and vital signs were monitored continuously by radiology nursing throughout the procedure under my direct supervision. COMPLICATIONS: None immediate. PROCEDURE: The procedure was explained to the patient. The risks and benefits of the procedure were discussed and the patient's questions were addressed. Informed consent was obtained from the patient. This procedure was performed immediately after the CT-guided lymph node biopsy. Ultrasound demonstrated a complex fluid collection in the lateral left hip. The skin was prepped with chlorhexidine  and sterile field was created. Skin was anesthetized with 1% lidocaine . Using ultrasound guidance, a Yueh catheter was directed into the collection and thick cloudy yellow fluid was aspirated. Approximately 15 mL of fluid was removed. Unable to completely decompress the collection. Yueh catheter was removed. Bandage placed over the puncture site. FINDINGS: Complex fluid collection lateral to the left hip. 15 mL of cloudy yellow fluid was removed. IMPRESSION: Ultrasound-guided aspiration of the left hip trochanteric bursal fluid collection. Electronically Signed   By: Juliene Balder M.D.   On: 01/07/2024 15:02   CT ABDOMINAL MASS BIOPSY Result Date: 01/07/2024 INDICATION: 78 year old with enlarged pelvic lymph nodes. EXAM: CT-GUIDED BIOPSY OF LEFT PELVIC LYMPH NODE Physician: Juliene SAUNDERS. Henn, MD MEDICATIONS: Moderate sedation ANESTHESIA/SEDATION: Moderate (conscious) sedation was employed during this procedure. A total of Versed  1.5 mg and fentanyl  75 mcg was administered intravenously at the order of the provider performing the procedure. Total intra-service moderate sedation time: 22 minutes. Patient's  level of consciousness and vital signs were monitored continuously by radiology nurse throughout the procedure under the supervision of the provider performing the procedure. COMPLICATIONS: None immediate. PROCEDURE: The procedure  was explained to the patient. The risks and benefits of the procedure were discussed and the patient's questions were addressed. Informed consent was obtained from the patient. Patient was placed supine on CT scanner. CT images of the lower abdomen and pelvis were obtained. A prominent left external iliac lymph node was identified and targeted. Left pelvic region was prepped with chlorhexidine  and sterile field was created. Skin was anesthetized using 1% lidocaine . Small incision was made. Using CT guidance, a 17 gauge coaxial needle was directed into the lateral aspect of the lymph node. Needle position was confirmed along the lateral aspect of the lymph node with follow up CT images. Core biopsies were obtained with an 18 gauge device. Specimens placed on Telfa pad with saline. Follow up CT images were obtained after biopsy. Needle was removed. Bandage placed over the puncture site. FINDINGS: Coaxial needle was placed along the lateral aspect of the prominent left external iliac lymph node. Small core samples were obtained. IMPRESSION: CT-guided core biopsies of a prominent left external iliac lymph node. Electronically Signed   By: Juliene Balder M.D.   On: 01/07/2024 14:58    Assessment and plan- Patient is a 78 y.o. male who returns to clinic for follow up of:   Intra-abdominal adenopathy - CT has shown left pelvic adenopathy stable between 2-3 cm. Discussed at case conference and not amenable to biopsy. Plan is to follow with CT. s/p biopsy- scant lymphoid tissue. No evidence of malignancy. Results reviewed with MD. No evidence of lymphoma.  Leukocytosis- thought to be reactive.  Thrombocytosis- thought to be reactive.  Anemia- baseline between 10.5-12. Plan to recheck cbc w/ diff, ferritin, iron  studies, b12, folate at next visit.  Fatigue- chronic. Question depression vs pain etiology. Recommend he speak with pcp for management. Can trial TENS unit for nonpharmaceutical back pain while he is awaiting  injections.   Disposition:  Follow up as scheduled  Visit Diagnosis 1. Adenopathy   2. Thrombocytosis   3. Normocytic anemia   4. Leukocytosis, unspecified type     Tinnie Dawn, DNP, AGNP-C, [facility] [phone] (clinic) 01/21/2024

## 2024-01-23 ENCOUNTER — Other Ambulatory Visit (HOSPITAL_COMMUNITY): Payer: Self-pay | Admitting: Orthopedic Surgery

## 2024-01-23 DIAGNOSIS — Z96642 Presence of left artificial hip joint: Secondary | ICD-10-CM

## 2024-01-23 NOTE — Progress Notes (Signed)
 Cardiology Office Note    Date:  01/26/2024   ID:  [name] [name], DOB 11-17-1945, MRN [id number]  PCP:  Lenon Layman ORN, MD  Cardiologist:  Deatrice Cage, MD  Electrophysiologist:  None   Chief Complaint: Follow up  History of Present Illness:   [name] [name] is a 78 y.o. male with history of coronary artery calcification and aortic atherosclerosis, persistent A. Fib status post DCCV on 02/19/2021, PVCs, DM2, HTN, HLD, iron  deficiency anemia requiring IV iron , osteoarthritis, diverticulosis, OSA on CPAP, BPH, intra-abdominal adenopathy, and obesity who presents for follow-up of coronary calcification, A-fib, and PVCs.    He was seen as a new patient by Dr. Cage on 01/02/2021 for preoperative cardiovascular risk stratification before total hip arthroplasty given preop EKG 12/28/2020 showed newly diagnosed A. fib.  At his visit with our office in 12/2020, he was not aware of any prior history of arrhythmia.  He had no previously known cardiac history.  He denied any symptoms of angina or decompensation.  He remained in A. fib with ventricular rates in the 90s bpm.  Given his CHA2DS2-VASc of 4, initiation of Eliquis  was recommended following hip surgery.  He was not requiring rate controlling medications.  Echo on 01/03/2021 demonstrated an EF of 55 to 60%, no regional wall motion abnormalities, indeterminate LV diastolic function parameters, normal RV systolic function and ventricular cavity size, mildly dilated left atrium, trivial mitral regurgitation, and an estimated right atrial pressure of 3 mmHg.  He was seen in follow-up on 02/08/2021 and was doing well from a cardiac perspective.  He remained in A. fib with controlled ventricular response.  He was started on carvedilol  3.125 mg twice daily.  He had been adherent to anticoagulation.  He subsequently underwent successful DCCV on 02/19/2021.  He was seen in the office on 03/16/2021 and was maintaining sinus rhythm with frequent PVCs noted in a  pattern of ventricular trigeminy.  Carvedilol  was titrated to 6.25 mg twice daily.  Subsequent 3-day Zio patch showed a predominant rhythm of sinus with an average rate of 81 bpm (range 63 to 121 bpm), first-degree AV block, 1 run of SVT lasting 4 beats, rare PACs, and frequent PVCs with a burden of 9.6%.  Given underlying first-degree AV block, further titration of beta-blocker was deferred.  For exertional shortness of breath and fatigue he underwent Lexiscan  MPI on 04/13/2021 which showed no significant ischemia with normal wall motion, EF estimated at 47% with GI uptake artifact noted (echo in 12/2020 demonstrated preserved LVSF), resting and recovery EKGs with frequent PVCs.  The study was overall low risk.  CT attenuated corrected images did show a mild coronary artery calcification and mild aortic atherosclerosis.  Given ectopy noted on EKG tracings during stress test, carvedilol  was discontinued and he was started on Lopressor  25 mg twice daily.  In follow-up he continued to have frequent PVCs and first-degree AV block leading metoprolol  to be decreased and flecainide  initiated with subsequent ETT stable.   He was seen in the office in 08/2022 and felt like he was a little more short of breath when compared to prior visits, however he was uncertain if this was just a stress weighing on him as he was undergoing evaluation for incidentally found enlarged lymph nodes and scrotal mass.  Subsequent echo in 08/2022 showed an EF of 60 to 65%, no regional wall motion abnormalities, grade 1 diastolic dysfunction, normal RV systolic function and ventricular cavity size, mildly dilated left atrium, no significant valvular  abnormalities, and borderline dilatation of the aortic root and ascending aorta measuring 39 and 36 mm respectively.  He was last seen in our office in 05/2023 and was doing well from a cardiac perspective with the main limiting factor being lumbar spinal stenosis and right shoulder pain.  Since he  was last seen he has undergone right total shoulder replacement without cardiac complication.  He comes in today doing well from a cardiac perspective and remains without symptoms of angina or cardiac decompensation.  No palpitations, dizziness, presyncope, or syncope.  No falls or symptoms concerning for bleeding.  No significant lower extremity swelling or progressive orthopnea.  He does not have any acute cardiac concerns at this time.   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   a.) TTE 09/26/2022: asc Ao 36 mm, Ao root 39 mm   Atrial fibrillation (HCC) 12/28/2020   a.) CHA2DS2-VASc = 5 (age x2, HTN, vascular disease history, T2DM) as of 07/29/2023; b.) s/p DCCV 02/19/2021  --> 200 J x1 -> NSR; c.) cardiac rate/rhythm maintained on oral flecainide  + metoprolol  succinate; chronically anticoagulated using apixaban    B12 deficiency    Bilateral adrenal adenomas    BPH (benign prostatic hyperplasia)    CAD (coronary artery disease)    Cholelithiasis    DDD (degenerative disc disease), lumbar    Diastolic dysfunction 09/26/2019   a.) TTE 09/26/2019: EF 60-65%, no RWMAs, G1DD, mild LAE, norm RVSF, asc Ao 36 mm, Ao root 39 mm   Diverticulosis    Elevated platelet count    Hemorrhoids    HLD (hyperlipidemia)    HOH (hard of hearing); wears BILATERAL hearing aids    Hypertension    IDA (iron  deficiency anemia)    Left hydrocele    Left inguinal hernia    On apixaban  therapy    OSA on CPAP    Pelvic lymphadenopathy    PSVT (paroxysmal supraventricular tachycardia) (HCC) 03/26/2021   a.) Zio 03/26/2021: 4 beat run at max rate 96 bpm   PVC's  (premature ventricular contractions)    a.) Zio 03/26/2021: frequent (9.6% burden)   Renal cyst, left    Spinal stenosis    Status post bilateral cataract extraction 2020   T2DM (type 2 diabetes mellitus) (HCC)     Past Surgical History:  Procedure Laterality Date   CARDIOVERSION N/A 02/19/2021   Procedure: CARDIOVERSION;  Surgeon: Darron Deatrice LABOR, MD;  Location: [facility];  Service: Cardiovascular;  Laterality: N/A;   CATARACT EXTRACTION W/PHACO Left 07/14/2018   Procedure: CATARACT EXTRACTION PHACO AND INTRAOCULAR LENS PLACEMENT (IOC) LEFT, DIABETIC;  Surgeon: Jaye Fallow, MD;  Location: [facility];  Service: Ophthalmology;  Laterality: Left;  US  00:50 CDE 8.93 Fluid pack lot # [id number]   CATARACT EXTRACTION W/PHACO Right 09/01/2018   Procedure: CATARACT EXTRACTION PHACO AND INTRAOCULAR LENS PLACEMENT (IOC)-RIGHT, DIABETIC;  Surgeon: Jaye Fallow, MD;  Location: [facility];  Service: Ophthalmology;  Laterality: Right;  US  00:50.9 CDE 6.80 Fluid Pack Lot # [id number]     COLONOSCOPY     CYSTOSCOPY WITH INSERTION OF UROLIFT     CYSTOSCOPY  WITH INSERTION OF UROLIFT  08/2018   ESOPHAGOGASTRODUODENOSCOPY (EGD) WITH PROPOFOL  N/A 03/17/2019   Procedure: ESOPHAGOGASTRODUODENOSCOPY (EGD) WITH PROPOFOL ;  Surgeon: Toledo, Ladell POUR, MD;  Location: [facility];  Service: Gastroenterology;  Laterality: N/A;   REVERSE SHOULDER ARTHROPLASTY Right 07/31/2023   Procedure: REVERSE SHOULDER ARTHROPLASTY;  Surgeon: Edie Norleen PARAS, MD;  Location: [facility];  Service: Orthopedics;  Laterality: Right;   TOTAL HIP ARTHROPLASTY Left 02/23/2020   Procedure: TOTAL HIP ARTHROPLASTY;  Surgeon: Mardee Lynwood SQUIBB, MD;  Location: [facility];  Service: Orthopedics;  Laterality: Left;   TOTAL HIP ARTHROPLASTY Right 01/10/2021   Procedure: TOTAL HIP ARTHROPLASTY;  Surgeon: Mardee Lynwood SQUIBB, MD;  Location: [facility];  Service: Orthopedics;  Laterality: Right;   TOTAL KNEE ARTHROPLASTY Bilateral 2014   Total knee  replacement.    Current Medications: Current Meds  Medication Sig   acetaminophen  (TYLENOL ) 650 MG CR tablet Take 1,300 mg by mouth every 8 (eight) hours as needed for pain.   ALPRAZolam  (XANAX ) 0.5 MG tablet Take 1 mg by mouth at bedtime as needed for sleep.   amLODipine  (NORVASC ) 5 MG tablet Take 5 mg by mouth daily.   atorvastatin  (LIPITOR) 40 MG tablet TAKE 1 TABLET BY MOUTH DAILY   benazepril -hydrochlorthiazide (LOTENSIN  HCT) 20-12.5 MG tablet Take 1 tablet by mouth in the morning.   Cyanocobalamin  (VITAMIN B-12) 5000 MCG TBDP Take 10,000 mcg by mouth every evening.   ELIQUIS  5 MG TABS tablet TAKE 1 TABLET BY MOUTH 2 TIMES DAILY.   ferrous sulfate  325 (65 FE) MG EC tablet Take 325 mg by mouth every evening.   flecainide  (TAMBOCOR ) 50 MG tablet TAKE ONE TABLET BY MOUTH TWICE DAILY   GEMTESA  75 MG TABS TAKE 1 TABLET BY MOUTH ONCE DAILY   glimepiride  (AMARYL ) 1 MG tablet Take 1 mg by mouth daily with breakfast.   JARDIANCE 25 MG TABS tablet Take 25 mg by mouth in the morning.   metFORMIN  (GLUCOPHAGE -XR) 500 MG 24 hr tablet Take 1,000 mg by mouth 2 (two) times daily with a meal.   metoprolol  succinate (TOPROL -XL) 25 MG 24 hr tablet TAKE 1 TABLET BY MOUTH DAILY.   naphazoline-glycerin  (CLEAR EYES REDNESS) 0.012-0.25 % SOLN Place 1-2 drops into both eyes 4 (four) times daily as needed (dry/irritated eyes.).   OZEMPIC , 2 MG/DOSE, 8 MG/3ML SOPN Inject 2 mg into the skin every Thursday at 6pm.   sertraline  (ZOLOFT ) 50 MG tablet Take 50 mg by mouth in the morning.   silodosin  (RAPAFLO ) 8 MG CAPS capsule TAKE 1 CAPSULE BY MOUTH ONCE DAILY WITH BREAKFAST   zinc gluconate 50 MG tablet Take 50 mg by mouth every evening.    Allergies:   Patient has no known allergies.   Social History   Socioeconomic History   Marital status: Married    Spouse name: [name]   Number of children: 3   Years of education: Not on file   Highest education level: Not on file  Occupational History   Occupation:  Nurse, adult, Personnel officer    Comment: retired  Tobacco Use   Smoking status: Never   Smokeless tobacco: Never  Vaping Use   Vaping status: Never Used  Substance and Sexual Activity   Alcohol use: Yes    Comment: wine-occasionally Previously drank a shot of vodka in the evenings   Drug use: Never   Sexual activity: Not on file  Other Topics Concern   Not on file  Social History Narrative   ** Merged History Encounter **  Patient lives with wife.  Feels safe   Social Drivers of Corporate investment banker Strain: Low Risk  (01/10/2024)   Received from [facility]   Overall Financial Resource Strain (CARDIA)    Difficulty of Paying Living Expenses: Not hard at all  Food Insecurity: No Food Insecurity (01/10/2024)   Received from [facility]   Hunger Vital Sign    Within the past 12 months, you worried that your food would run out before you got the money to buy more.: Never true    Within the past 12 months, the food you bought just didn't last and you didn't have money to get more.: Never true  Transportation Needs: No Transportation Needs (01/10/2024)   Received from [facility] - Transportation    In the past 12 months, has lack of transportation kept you from medical appointments or from getting medications?: No    Lack of Transportation (Non-Medical): No  Physical Activity: Not on file  Stress: Not on file  Social Connections: Not on file     Family History:  The patient's family history includes Alzheimer's disease in his mother; Colon cancer in his sister; Diabetes in his mother.  ROS:   12-point review of systems is negative unless otherwise noted in the HPI.   EKGs/Labs/Other Studies Reviewed:    Studies reviewed were summarized above. The additional studies were reviewed today:  2D echo 09/26/2022: 1. Left ventricular ejection fraction, by estimation, is 60 to 65%. The  left ventricle has normal  function. The left ventricle has no regional  wall motion abnormalities. Left ventricular diastolic parameters are  consistent with Grade I diastolic  dysfunction (impaired relaxation).   2. Right ventricular systolic function is normal. The right ventricular  size is normal. Tricuspid regurgitation signal is inadequate for assessing  PA pressure.   3. Left atrial size was mildly dilated.   4. The mitral valve is normal in structure. No evidence of mitral valve  regurgitation. No evidence of mitral stenosis.   5. The aortic valve is normal in structure. Aortic valve regurgitation is  not visualized. No aortic stenosis is present.   6. There is borderline dilatation of the aortic root, measuring 39 mm.  There is borderline dilatation of the ascending aorta, measuring 36 mm.   7. The inferior vena cava is normal in size with greater than 50%  respiratory variability, suggesting right atrial pressure of 3 mmHg.   8. Rhythm is normal sinus  __________   ETT 07/04/2021:   Exercise capacity was normal. Patient exercised for 5 min and 0 sec. Maximum HR of 160 bpm. MPHR 110.0 %. Peak METS 7.0 .   No ST deviation was noted. There were no arrhythmias during stress. There were no arrhythmias during recovery. ECG was interpretable and without significant changes. The ECG was negative for ischemia.   Normal treadmill stress test with no evidence of arrhythmia. __________   Lexiscan  MPI 03/2021:   No ST deviation was noted.   Pharmacological myocardial perfusion imaging study with no significant  ischemia Normal wall motion, EF estimated at 47%, GI uptake artifact noted No EKG changes concerning for ischemia at peak stress or in recovery. Resting and recovery EKG with frequent PVCs CT attenuation correction images with mild coronary calcification, mild aortic atherosclerosis Low risk scan   Zio patch 03/2021: Patient had a min HR of 63 bpm, max HR of 121 bpm, and avg HR of 81  bpm.  Predominant  underlying rhythm was Sinus Rhythm. First Degree AV Block was present. 1  run of Supraventricular Tachycardia occurred lasting 4 beats with a max rate of 96 bpm (avg 92 bpm). Rare PACs. Frequent PVCs with a burden of 9.6%. __________   2D echo 01/03/2021: 1. Left ventricular ejection fraction, by estimation, is 55 to 60%. The  left ventricle has normal function. The left ventricle has no regional  wall motion abnormalities. Left ventricular diastolic parameters are  indeterminate.   2. Right ventricular systolic function is normal. The right ventricular  size is normal.   3. Left atrial size was mildly dilated.   4. The mitral valve is normal in structure. Trivial mitral valve  regurgitation.   5. The aortic valve is tricuspid. Aortic valve regurgitation is not  visualized.   6. The inferior vena cava is normal in size with greater than 50%  respiratory variability, suggesting right atrial pressure of 3 mmHg.   EKG:  EKG is ordered today.  The EKG ordered today demonstrates NSR, 77 bpm, first-degree AV block, no acute ST-T changes, consistent with prior tracing  Recent Labs: 11/28/2023: ALT 18; BUN 11; Creatinine 0.77; Potassium 4.6; Sodium 128 01/07/2024: Hemoglobin 11.8; Platelets 497  Recent Lipid Panel    Component Value Date/Time   CHOL 170 12/20/2014 1238   TRIG 75 12/20/2014 1238   HDL 65 12/20/2014 1238   CHOLHDL 2.6 12/20/2014 1238   LDLCALC 90 12/20/2014 1238    PHYSICAL EXAM:    VS:  BP (!) 155/89 (BP Location: Left Arm, Patient Position: Sitting, Cuff Size: Normal)   Pulse 82   Resp 20   Ht 5' 10 (1.778 m)   Wt 256 lb (116.1 kg)   BMI 36.73 kg/m   BMI: Body mass index is 36.73 kg/m.  Physical Exam Vitals reviewed.  Constitutional:      Appearance: He is well-developed.  HENT:     Head: Normocephalic and atraumatic.  Eyes:     General:        Right eye: No discharge.        Left eye: No discharge.  Cardiovascular:     Rate and Rhythm: Normal rate and  regular rhythm.     Heart sounds: Normal heart sounds, S1 normal and S2 normal. Heart sounds not distant. No midsystolic click and no opening snap. No murmur heard.    No friction rub.  Pulmonary:     Effort: Pulmonary effort is normal. No respiratory distress.     Breath sounds: Normal breath sounds. No decreased breath sounds, wheezing, rhonchi or rales.  Chest:     Chest wall: No tenderness.  Musculoskeletal:     Cervical back: Normal range of motion.     Right lower leg: No edema.     Left lower leg: No edema.  Skin:    General: Skin is warm and dry.     Nails: There is no clubbing.  Neurological:     Mental Status: He is alert and oriented to person, place, and time.  Psychiatric:        Speech: Speech normal.        Behavior: Behavior normal.        Thought Content: Thought content normal.        Judgment: Judgment normal.     Wt Readings from Last 3 Encounters:  01/26/24 256 lb (116.1 kg)  01/21/24 253 lb (114.8 kg)  01/07/24 248 lb 12.8 oz (112.9 kg)  ASSESSMENT & PLAN:   Permanent A-fib: Maintaining sinus rhythm with Toprol -XL 25 mg daily and flecainide  50 mg twice daily..  CHA2DS2-VASc 5 (HTN, age x 2, DM, vascular disease).  Remains on apixaban  5 mg twice daily and does not meet reduced dosing criteria.  No falls or symptoms concerning for bleeding.  Recent labs stable.  Frequent PVCs: Quiescent.  He remains on metoprolol .  Coronary artery calcification/aortic atherosclerosis/HLD: No symptoms suggestive of angina or cardiac decompensation.  LDL 53.  He remains on apixaban  in lieu of aspirin given A-fib.  Remains on atorvastatin .  HTN: Blood pressure is mildly elevated today though typically well-controlled.  He remains on amlodipine  5 mg, Toprol -XL 25 mg, and benazepril /HCTZ 20/12.5 mg.   Disposition: F/u with Dr. Darron or an APP in 6 months.   Medication Adjustments/Labs and Tests Ordered: Current medicines are reviewed at length with the patient today.   Concerns regarding medicines are outlined above. Medication changes, Labs and Tests ordered today are summarized above and listed in the Patient Instructions accessible in Encounters.   Signed, Bernardino Bring, PA-C 01/26/2024 12:42 PM     [facility] [place] [address] [place], [place] [address] [phone]

## 2024-01-26 ENCOUNTER — Ambulatory Visit: Attending: Physician Assistant | Admitting: Physician Assistant

## 2024-01-26 ENCOUNTER — Encounter: Payer: Self-pay | Admitting: Physician Assistant

## 2024-01-26 VITALS — BP 155/89 | HR 82 | Resp 20 | Ht 70.0 in | Wt 256.0 lb

## 2024-01-26 DIAGNOSIS — I4819 Other persistent atrial fibrillation: Secondary | ICD-10-CM

## 2024-01-26 DIAGNOSIS — I2510 Atherosclerotic heart disease of native coronary artery without angina pectoris: Secondary | ICD-10-CM | POA: Diagnosis not present

## 2024-01-26 DIAGNOSIS — I493 Ventricular premature depolarization: Secondary | ICD-10-CM

## 2024-01-26 DIAGNOSIS — I10 Essential (primary) hypertension: Secondary | ICD-10-CM

## 2024-01-26 DIAGNOSIS — E785 Hyperlipidemia, unspecified: Secondary | ICD-10-CM

## 2024-01-26 DIAGNOSIS — I700 Atherosclerosis of aorta: Secondary | ICD-10-CM

## 2024-01-26 NOTE — Patient Instructions (Signed)
 Medication Instructions:  None ordered at this time  *If you need a refill on your cardiac medications before your next appointment, please call your pharmacy*  Lab Work: None ordered at this time  If you have labs (blood work) drawn today and your tests are completely normal, you will receive your results only by: MyChart Message (if you have MyChart) OR A paper copy in the mail If you have any lab test that is abnormal or we need to change your treatment, we will call you to review the results.  Testing/Procedures: None ordered at this time   Follow-Up: At [facility], you and your health needs are our priority.  As part of our continuing mission to provide you with exceptional heart care, our providers are all part of one team.  This team includes your primary Cardiologist (physician) and Advanced Practice Providers or APPs (Physician Assistants and Nurse Practitioners) who all work together to provide you with the care you need, when you need it.  Your next appointment:   6 month(s)  Provider:   You may see Deatrice Cage, MD or Bernardino Bring, PA-C

## 2024-01-28 ENCOUNTER — Inpatient Hospital Stay

## 2024-01-28 DIAGNOSIS — R590 Localized enlarged lymph nodes: Secondary | ICD-10-CM | POA: Diagnosis not present

## 2024-01-28 DIAGNOSIS — R599 Enlarged lymph nodes, unspecified: Secondary | ICD-10-CM

## 2024-01-28 LAB — CBC WITH DIFFERENTIAL (CANCER CENTER ONLY)
Abs Immature Granulocytes: 0.07 K/uL (ref 0.00–0.07)
Basophils Absolute: 0.1 K/uL (ref 0.0–0.1)
Basophils Relative: 1 %
Eosinophils Absolute: 0.3 K/uL (ref 0.0–0.5)
Eosinophils Relative: 3 %
HCT: 37.9 % — ABNORMAL LOW (ref 39.0–52.0)
Hemoglobin: 12.5 g/dL — ABNORMAL LOW (ref 13.0–17.0)
Immature Granulocytes: 1 %
Lymphocytes Relative: 20 %
Lymphs Abs: 1.9 K/uL (ref 0.7–4.0)
MCH: 29.2 pg (ref 26.0–34.0)
MCHC: 33 g/dL (ref 30.0–36.0)
MCV: 88.6 fL (ref 80.0–100.0)
Monocytes Absolute: 0.9 K/uL (ref 0.1–1.0)
Monocytes Relative: 9 %
Neutro Abs: 6.3 K/uL (ref 1.7–7.7)
Neutrophils Relative %: 66 %
Platelet Count: 379 K/uL (ref 150–400)
RBC: 4.28 MIL/uL (ref 4.22–5.81)
RDW: 17.1 % — ABNORMAL HIGH (ref 11.5–15.5)
WBC Count: 9.5 K/uL (ref 4.0–10.5)
nRBC: 0 % (ref 0.0–0.2)

## 2024-01-28 LAB — FERRITIN: Ferritin: 69 ng/mL (ref 24–336)

## 2024-01-28 LAB — IRON AND TIBC
Iron: 53 ug/dL (ref 45–182)
Saturation Ratios: 16 % — ABNORMAL LOW (ref 17.9–39.5)
TIBC: 332 ug/dL (ref 250–450)
UIBC: 279 ug/dL

## 2024-01-28 LAB — FOLATE: Folate: 5.2 ng/mL — ABNORMAL LOW (ref >5.9)

## 2024-01-28 LAB — VITAMIN B12: Vitamin B-12: 810 pg/mL (ref 180–914)

## 2024-02-02 ENCOUNTER — Ambulatory Visit
Admission: RE | Admit: 2024-02-02 | Discharge: 2024-02-02 | Disposition: A | Discharge status: Home / Self Care | Source: Ambulatory Visit | Attending: Orthopedic Surgery | Admitting: Orthopedic Surgery

## 2024-02-02 DIAGNOSIS — Z96642 Presence of left artificial hip joint: Secondary | ICD-10-CM | POA: Diagnosis present

## 2024-02-02 MED ORDER — GADOBUTROL 1 MMOL/ML IV SOLN
10.0000 mL | Freq: Once | INTRAVENOUS | Status: AC | PRN
  Administered 2024-02-02: 10 mL via INTRAVENOUS

## 2024-02-04 ENCOUNTER — Other Ambulatory Visit: Payer: Self-pay | Admitting: Cardiovascular Disease

## 2024-02-18 ENCOUNTER — Ambulatory Visit (INDEPENDENT_AMBULATORY_CARE_PROVIDER_SITE_OTHER): Admitting: Urology

## 2024-02-18 VITALS — BP 141/84 | HR 73 | Ht 67.0 in | Wt 248.0 lb

## 2024-02-18 DIAGNOSIS — N433 Hydrocele, unspecified: Secondary | ICD-10-CM

## 2024-02-18 NOTE — Progress Notes (Signed)
 02/18/2024 10:52 AM   [name] [name] 11-10-1945 [id number]  Referring provider: Lenon Layman ORN, MD [address] [facility] [place],  [place] [address]  Chief Complaint  Patient presents with   Hydrocele    Urologic history  1. BPH with LUTS    2. Microhematuria CT abdomen and pelvis, no uptrack abnormalities.  Cystoscopy 10/2022 lateral lobe enlargement/hypervascularity Prostate volume calculated on CT approximately 120 cc.  3.  Left hydrocele Aspiration 03/29/2021   HPI: [name] [name] is a 78 y.o. male who presents to discuss hydrocele aspiration.  Underwent hydrocele aspiration September 2022 Has had fluid reaccumulation which occurred ~1 year ago.  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   a.) TTE 09/26/2022: asc Ao 36 mm, Ao root 39 mm   Atrial fibrillation (HCC) 12/28/2020   a.) CHA2DS2-VASc = 5 (age x2, HTN, vascular disease history, T2DM) as of 07/29/2023; b.) s/p DCCV 02/19/2021  --> 200 J x1 -> NSR; c.) cardiac rate/rhythm maintained on oral flecainide  + metoprolol  succinate; chronically anticoagulated using apixaban    B12 deficiency    Bilateral adrenal adenomas    BPH (benign prostatic hyperplasia)    CAD (coronary artery disease)    Cholelithiasis    DDD (degenerative disc disease), lumbar    Diastolic dysfunction 09/26/2019   a.) TTE 09/26/2019: EF 60-65%, no RWMAs, G1DD, mild LAE, norm RVSF, asc Ao 36 mm, Ao root 39 mm   Diverticulosis    Elevated platelet count    Hemorrhoids    HLD (hyperlipidemia)    HOH (hard of hearing); wears BILATERAL hearing aids    Hypertension    IDA (iron  deficiency anemia)    Left hydrocele    Left inguinal hernia    On apixaban  therapy    OSA on CPAP    Pelvic lymphadenopathy     PSVT (paroxysmal supraventricular tachycardia) (HCC) 03/26/2021   a.) Zio 03/26/2021: 4 beat run at max rate 96 bpm   PVC's (premature ventricular contractions)    a.) Zio 03/26/2021: frequent (9.6% burden)   Renal cyst, left    Spinal stenosis    Status post bilateral cataract extraction 2020   T2DM (type 2 diabetes mellitus) (HCC)     Surgical History: Past Surgical History:  Procedure Laterality Date   CARDIOVERSION N/A 02/19/2021   Procedure: CARDIOVERSION;  Surgeon: Darron Deatrice LABOR, MD;  Location: [facility];  Service: Cardiovascular;  Laterality: N/A;   CATARACT EXTRACTION W/PHACO Left 07/14/2018   Procedure: CATARACT EXTRACTION PHACO AND INTRAOCULAR LENS PLACEMENT (IOC) LEFT, DIABETIC;  Surgeon: Jaye Fallow, MD;  Location: [facility];  Service: Ophthalmology;  Laterality: Left;  US  00:50 CDE 8.93 Fluid pack lot # [id number]   CATARACT EXTRACTION W/PHACO Right 09/01/2018   Procedure: CATARACT EXTRACTION PHACO AND INTRAOCULAR LENS PLACEMENT (IOC)-RIGHT, DIABETIC;  Surgeon: Jaye Fallow, MD;  Location: [facility];  Service: Ophthalmology;  Laterality: Right;  US  00:50.9 CDE 6.80 Fluid Pack Lot # [id number]     COLONOSCOPY     CYSTOSCOPY WITH INSERTION OF UROLIFT     CYSTOSCOPY WITH INSERTION OF UROLIFT  08/2018   ESOPHAGOGASTRODUODENOSCOPY (EGD) WITH PROPOFOL  N/A 03/17/2019   Procedure: ESOPHAGOGASTRODUODENOSCOPY (EGD) WITH PROPOFOL ;  Surgeon: Toledo, Ladell POUR, MD;  Location: [facility];  Service: Gastroenterology;  Laterality: N/A;   REVERSE SHOULDER ARTHROPLASTY Right 07/31/2023   Procedure: REVERSE SHOULDER ARTHROPLASTY;  Surgeon: Edie Norleen PARAS, MD;  Location: [facility];  Service: Orthopedics;  Laterality: Right;   TOTAL HIP ARTHROPLASTY Left 02/23/2020   Procedure: TOTAL HIP ARTHROPLASTY;  Surgeon: Mardee Lynwood SQUIBB, MD;  Location: [facility];  Service: Orthopedics;  Laterality: Left;   TOTAL HIP ARTHROPLASTY Right 01/10/2021   Procedure: TOTAL HIP ARTHROPLASTY;   Surgeon: Mardee Lynwood SQUIBB, MD;  Location: [facility];  Service: Orthopedics;  Laterality: Right;   TOTAL KNEE ARTHROPLASTY Bilateral 2014   Total knee replacement.    Home Medications:  Allergies as of 02/18/2024   No Known Allergies      Medication List        Accurate as of February 18, 2024 10:52 AM. If you have any questions, ask your nurse or doctor.          acetaminophen  650 MG CR tablet Commonly known as: TYLENOL  Take 1,300 mg by mouth every 8 (eight) hours as needed for pain.   ALPRAZolam  0.5 MG tablet Commonly known as: XANAX  Take 1 mg by mouth at bedtime as needed for sleep.   amLODipine  5 MG tablet Commonly known as: NORVASC  Take 5 mg by mouth daily.   atorvastatin  40 MG tablet Commonly known as: LIPITOR TAKE 1 TABLET BY MOUTH ONCE DAILY   benazepril -hydrochlorthiazide 20-12.5 MG tablet Commonly known as: LOTENSIN  HCT Take 1 tablet by mouth in the morning.   Eliquis  5 MG Tabs tablet Generic drug: apixaban  TAKE 1 TABLET BY MOUTH 2 TIMES DAILY.   ferrous sulfate  325 (65 FE) MG EC tablet Take 325 mg by mouth every evening.   flecainide  50 MG tablet Commonly known as: TAMBOCOR  TAKE ONE TABLET BY MOUTH TWICE DAILY   Gemtesa  75 MG Tabs Generic drug: Vibegron  TAKE 1 TABLET BY MOUTH ONCE DAILY   glimepiride  1 MG tablet Commonly known as: AMARYL  Take 1 mg by mouth daily with breakfast.   Jardiance 25 MG Tabs tablet Generic drug: empagliflozin Take 25 mg by mouth in the morning.   metFORMIN  500 MG 24 hr tablet Commonly known as: GLUCOPHAGE -XR Take 1,000 mg by mouth 2 (two) times daily with a meal.   metoprolol  succinate 25 MG 24 hr tablet Commonly known as: TOPROL -XL TAKE 1 TABLET BY MOUTH DAILY.   naphazoline-glycerin  0.012-0.25 % Soln Commonly known as: CLEAR EYES REDNESS Place 1-2 drops into both eyes 4 (four) times daily as needed (dry/irritated eyes.).   Ozempic  (2 MG/DOSE) 8 MG/3ML Sopn Generic drug: Semaglutide  (2 MG/DOSE) Inject 2 mg  into the skin every Thursday at 6pm.   sertraline  50 MG tablet Commonly known as: ZOLOFT  Take 50 mg by mouth in the morning.   silodosin  8 MG Caps capsule Commonly known as: RAPAFLO  TAKE 1 CAPSULE BY MOUTH ONCE DAILY WITH BREAKFAST   Vitamin B-12 5000 MCG Tbdp Take 10,000 mcg by mouth every evening.   zinc gluconate 50 MG tablet Take 50 mg by mouth every evening.        Allergies: No Known Allergies  Family History: Family History  Problem Relation Age of Onset   Diabetes Mother    Alzheimer's disease Mother    Colon cancer Sister     Social History:  reports that he has never smoked. He has never used smokeless tobacco. He reports current alcohol use. He reports that he does not use drugs.   Physical Exam: BP (!) 141/84   Pulse 73  Ht 5' 7 (1.702 m)   Wt 248 lb (112.5 kg)   BMI 38.84 kg/m   Constitutional:  Alert, No acute distress. HEENT: [place] AT Respiratory: Normal respiratory effort, no increased work of breathing. GU: Large left hydrocele with penile retraction secondary to size Psychiatric: Normal mood and affect.   Assessment & Plan:    1.  Left hydrocele Schedule left hydrocele aspiration   Glendia JAYSON Barba, MD  [facility] [address], [address] [place], [place] [address] [phone]

## 2024-02-25 ENCOUNTER — Encounter: Payer: Self-pay | Admitting: Urology

## 2024-03-04 ENCOUNTER — Other Ambulatory Visit: Payer: Self-pay | Admitting: Cardiovascular Disease

## 2024-03-11 ENCOUNTER — Other Ambulatory Visit: Payer: Self-pay | Admitting: Cardiovascular Disease

## 2024-03-11 DIAGNOSIS — I4819 Other persistent atrial fibrillation: Secondary | ICD-10-CM

## 2024-03-29 ENCOUNTER — Encounter: Payer: Self-pay | Admitting: Oncology

## 2024-03-29 ENCOUNTER — Encounter: Payer: Self-pay | Admitting: Urology

## 2024-03-29 ENCOUNTER — Ambulatory Visit: Admitting: Urology

## 2024-03-29 VITALS — BP 146/89 | HR 80 | Ht 68.0 in | Wt 258.0 lb

## 2024-03-29 DIAGNOSIS — N433 Hydrocele, unspecified: Secondary | ICD-10-CM | POA: Diagnosis not present

## 2024-03-29 NOTE — Progress Notes (Signed)
   Procedure note  Diagnosis: Left hydrocele  Procedure: Aspiration left hydrocele  Indications: 78 y.o. male with enlarging left hydrocele.  Prior aspiration 2022 with 150 mL of fluid aspirated  Anesthesia: 1% plain Xylocaine   Description: Patient was placed in supine position.  Scrotal ultrasound was performed and the testis was well posterior to the hydrocele.  He was prepped and draped sterilely.  The anterior aspect of the left hemiscrotum in the midportion was anesthetized with 1 cc of 1% plain Xylocaine .  A 20-gauge Angiocath was inserted at the anesthetized site and the needle was removed.  350 mL of straw-colored fluid was aspirated.  Complications: None  Plan: He is considering hydrocelectomy when fluid reaccumulates.   Glendia Barba, MD

## 2024-03-30 ENCOUNTER — Encounter: Payer: Self-pay | Admitting: Oncology

## 2024-03-30 ENCOUNTER — Ambulatory Visit: Payer: Self-pay | Admitting: Oncology

## 2024-03-30 ENCOUNTER — Inpatient Hospital Stay: Attending: Oncology

## 2024-03-30 ENCOUNTER — Inpatient Hospital Stay: Admitting: Oncology

## 2024-03-30 VITALS — BP 131/77 | HR 76 | Temp 96.1°F | Resp 19 | Ht 68.0 in | Wt 261.7 lb

## 2024-03-30 DIAGNOSIS — I4891 Unspecified atrial fibrillation: Secondary | ICD-10-CM | POA: Diagnosis not present

## 2024-03-30 DIAGNOSIS — Z7901 Long term (current) use of anticoagulants: Secondary | ICD-10-CM | POA: Insufficient documentation

## 2024-03-30 DIAGNOSIS — D509 Iron deficiency anemia, unspecified: Secondary | ICD-10-CM | POA: Insufficient documentation

## 2024-03-30 DIAGNOSIS — R599 Enlarged lymph nodes, unspecified: Secondary | ICD-10-CM

## 2024-03-30 DIAGNOSIS — Z79899 Other long term (current) drug therapy: Secondary | ICD-10-CM | POA: Diagnosis not present

## 2024-03-30 DIAGNOSIS — R590 Localized enlarged lymph nodes: Secondary | ICD-10-CM | POA: Diagnosis present

## 2024-03-30 DIAGNOSIS — Z800 Family history of malignant neoplasm of digestive organs: Secondary | ICD-10-CM | POA: Diagnosis not present

## 2024-03-30 LAB — CBC WITH DIFFERENTIAL (CANCER CENTER ONLY)
Abs Immature Granulocytes: 0.07 K/uL (ref 0.00–0.07)
Basophils Absolute: 0.1 K/uL (ref 0.0–0.1)
Basophils Relative: 1 %
Eosinophils Absolute: 0.2 K/uL (ref 0.0–0.5)
Eosinophils Relative: 2 %
HCT: 40.7 % (ref 39.0–52.0)
Hemoglobin: 13.3 g/dL (ref 13.0–17.0)
Immature Granulocytes: 1 %
Lymphocytes Relative: 23 %
Lymphs Abs: 2.4 K/uL (ref 0.7–4.0)
MCH: 30.6 pg (ref 26.0–34.0)
MCHC: 32.7 g/dL (ref 30.0–36.0)
MCV: 93.8 fL (ref 80.0–100.0)
Monocytes Absolute: 0.9 K/uL (ref 0.1–1.0)
Monocytes Relative: 9 %
Neutro Abs: 6.7 K/uL (ref 1.7–7.7)
Neutrophils Relative %: 64 %
Platelet Count: 396 K/uL (ref 150–400)
RBC: 4.34 MIL/uL (ref 4.22–5.81)
RDW: 14.6 % (ref 11.5–15.5)
WBC Count: 10.3 K/uL (ref 4.0–10.5)
nRBC: 0 % (ref 0.0–0.2)

## 2024-03-30 LAB — IRON AND TIBC
Iron: 46 ug/dL (ref 45–182)
Saturation Ratios: 13 % — ABNORMAL LOW (ref 17.9–39.5)
TIBC: 353 ug/dL (ref 250–450)
UIBC: 307 ug/dL

## 2024-03-30 LAB — FERRITIN: Ferritin: 37 ng/mL (ref 24–336)

## 2024-03-30 NOTE — Progress Notes (Signed)
 Hematology/Oncology Consult note [facility]  [phone] [phone]  Patient Care Team: Lenon Layman ORN, MD as PCP - General (Internal Medicine) Darron Deatrice LABOR, MD as PCP - Cardiology (Cardiology) Hooten, Lynwood SQUIBB, MD (Orthopedic Surgery) Twana Jeneal DASEN, MD (Hematology and Oncology) Melanee Annah BROCKS, MD as Consulting Physician (Oncology)   Name of the patient: [name] [name]  [id number]  07-08-1945   Date of visit: 03/30/24  Diagnosis-intra-abdominal adenopathy likely benign  Chief complaint/ Reason for visit-routine follow-up of anemia and intra-abdominal adenopathy  Heme/Onc history:  Patient is a 78 year old male who has seen me in the past for iron  deficiency. More recently patient underwent MRI lumbar spine in January 2024 for symptoms of low back pain which showed evidence of spinal stenosis but incidentally showed a prominent periaortic lymph node measuring 1.5 cm. This was followed by a CT abdomen and pelvis with contrast which showed bilateral adrenal adenomas. He was also noted to have external iliac lymphadenopathy measuring 2.4 x 1.9 cm and a common iliac lymph node measuring 3.3 x 1.5 cm. Masslike area identified in the scrotum of on the left side measuring 6.6 x 6.1 cm. 11 mm cystic lesion in the uncinate process of pancreas for which MRI was recommended in 2 years.     Interval history-patient is presently feeling well.  Denies any blood loss in his stool or urine.  He is trying to become more active and will start acrobatic exercises.  ECOG PS- 2 Pain scale- 0   Review of systems- Review of Systems  Constitutional:  Negative for chills, fever, malaise/fatigue and weight loss.  HENT:  Negative for congestion, ear discharge and nosebleeds.   Eyes:  Negative for blurred vision.  Respiratory:  Negative for cough, hemoptysis, sputum production, shortness of breath and wheezing.   Cardiovascular:  Negative for chest pain,  palpitations, orthopnea and claudication.  Gastrointestinal:  Negative for abdominal pain, blood in stool, constipation, diarrhea, heartburn, melena, nausea and vomiting.  Genitourinary:  Negative for dysuria, flank pain, frequency, hematuria and urgency.  Musculoskeletal:  Negative for back pain, joint pain and myalgias.  Skin:  Negative for rash.  Neurological:  Negative for dizziness, tingling, focal weakness, seizures, weakness and headaches.  Endo/Heme/Allergies:  Does not bruise/bleed easily.  Psychiatric/Behavioral:  Negative for depression and suicidal ideas. The patient does not have insomnia.       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   a.) TTE 09/26/2022: asc Ao 36 mm, Ao root 39 mm   Atrial fibrillation (HCC) 12/28/2020   a.) CHA2DS2-VASc = 5 (age x2, HTN, vascular disease history, T2DM) as of 07/29/2023; b.) s/p DCCV 02/19/2021  --> 200 J x1 -> NSR; c.) cardiac rate/rhythm maintained on oral flecainide  + metoprolol  succinate; chronically anticoagulated using apixaban    B12 deficiency    Bilateral adrenal adenomas    BPH (benign prostatic hyperplasia)    CAD (coronary artery disease)    Cholelithiasis    DDD (degenerative disc disease), lumbar    Diastolic dysfunction 09/26/2019   a.) TTE 09/26/2019: EF 60-65%, no RWMAs, G1DD, mild LAE, norm RVSF, asc Ao 36 mm, Ao root 39 mm   Diverticulosis    Elevated platelet count    Hemorrhoids    HLD (hyperlipidemia)    HOH (hard of hearing); wears BILATERAL  hearing aids    Hypertension    IDA (iron  deficiency anemia)    Left hydrocele    Left inguinal hernia    On apixaban  therapy    OSA on CPAP    Pelvic lymphadenopathy    PSVT (paroxysmal supraventricular tachycardia) 03/26/2021   a.) Zio 03/26/2021: 4 beat run at max rate 96 bpm   PVC's (premature ventricular contractions)     a.) Zio 03/26/2021: frequent (9.6% burden)   Renal cyst, left    Spinal stenosis    Status post bilateral cataract extraction 2020   T2DM (type 2 diabetes mellitus) (HCC)      Past Surgical History:  Procedure Laterality Date   CARDIOVERSION N/A 02/19/2021   Procedure: CARDIOVERSION;  Surgeon: Darron Deatrice LABOR, MD;  Location: [facility];  Service: Cardiovascular;  Laterality: N/A;   CATARACT EXTRACTION W/PHACO Left 07/14/2018   Procedure: CATARACT EXTRACTION PHACO AND INTRAOCULAR LENS PLACEMENT (IOC) LEFT, DIABETIC;  Surgeon: Jaye Fallow, MD;  Location: [facility];  Service: Ophthalmology;  Laterality: Left;  US  00:50 CDE 8.93 Fluid pack lot # [id number]   CATARACT EXTRACTION W/PHACO Right 09/01/2018   Procedure: CATARACT EXTRACTION PHACO AND INTRAOCULAR LENS PLACEMENT (IOC)-RIGHT, DIABETIC;  Surgeon: Jaye Fallow, MD;  Location: [facility];  Service: Ophthalmology;  Laterality: Right;  US  00:50.9 CDE 6.80 Fluid Pack Lot # [id number]     COLONOSCOPY     CYSTOSCOPY WITH INSERTION OF UROLIFT     CYSTOSCOPY WITH INSERTION OF UROLIFT  08/2018   ESOPHAGOGASTRODUODENOSCOPY (EGD) WITH PROPOFOL  N/A 03/17/2019   Procedure: ESOPHAGOGASTRODUODENOSCOPY (EGD) WITH PROPOFOL ;  Surgeon: Toledo, Ladell POUR, MD;  Location: [facility];  Service: Gastroenterology;  Laterality: N/A;   REVERSE SHOULDER ARTHROPLASTY Right 07/31/2023   Procedure: REVERSE SHOULDER ARTHROPLASTY;  Surgeon: Edie Norleen PARAS, MD;  Location: [facility];  Service: Orthopedics;  Laterality: Right;   TOTAL HIP ARTHROPLASTY Left 02/23/2020   Procedure: TOTAL HIP ARTHROPLASTY;  Surgeon: Mardee Lynwood SQUIBB, MD;  Location: [facility];  Service: Orthopedics;  Laterality: Left;   TOTAL HIP ARTHROPLASTY Right 01/10/2021   Procedure: TOTAL HIP ARTHROPLASTY;  Surgeon: Mardee Lynwood SQUIBB, MD;  Location: [facility];  Service: Orthopedics;  Laterality: Right;   TOTAL KNEE ARTHROPLASTY Bilateral 2014   Total knee replacement.    Social History    Socioeconomic History   Marital status: Married    Spouse name: [name]   Number of children: 3   Years of education: Not on file   Highest education level: Not on file  Occupational History   Occupation: Nurse, adult, Personnel officer    Comment: retired  Tobacco Use   Smoking status: Never   Smokeless tobacco: Never  Vaping Use   Vaping status: Never Used  Substance and Sexual Activity   Alcohol use: Yes    Comment: wine-occasionally Previously drank a shot of vodka in the evenings   Drug use: Never   Sexual activity: Not on file  Other Topics Concern   Not on file  Social History Narrative   ** Merged History Encounter **       Patient lives with wife.  Feels safe   Social Drivers of Corporate investment banker Strain: Low Risk  (02/13/2024)   Received from [facility]   Overall Financial Resource Strain (CARDIA)    Difficulty of Paying Living Expenses: Not hard at all  Food Insecurity: No Food Insecurity (02/13/2024)   [facility] from [facility]   Hunger Vital Sign    Within the  past 12 months, you worried that your food would run out before you got the money to buy more.: Never true    Within the past 12 months, the food you bought just didn't last and you didn't have money to get more.: Never true  Transportation Needs: No Transportation Needs (02/13/2024)   Received from [facility] - Transportation    In the past 12 months, has lack of transportation kept you from medical appointments or from getting medications?: No    Lack of Transportation (Non-Medical): No  Physical Activity: Not on file  Stress: Not on file  Social Connections: Not on file  Intimate Partner Violence: Not on file    Family History  Problem Relation Age of Onset   Diabetes Mother    Alzheimer's disease Mother    Colon cancer Sister      Current Outpatient Medications:    acetaminophen  (TYLENOL ) 650 MG CR tablet, Take 1,300 mg by  mouth every 8 (eight) hours as needed for pain., Disp: , Rfl:    ALPRAZolam  (XANAX ) 0.5 MG tablet, Take 1 mg by mouth at bedtime as needed for sleep., Disp: , Rfl:    amLODipine  (NORVASC ) 5 MG tablet, Take 5 mg by mouth daily., Disp: , Rfl:    atorvastatin  (LIPITOR) 40 MG tablet, TAKE 1 TABLET BY MOUTH ONCE DAILY, Disp: 90 tablet, Rfl: 1   benazepril -hydrochlorthiazide (LOTENSIN  HCT) 20-12.5 MG tablet, Take 1 tablet by mouth in the morning., Disp: , Rfl:    Continuous Glucose Sensor (DEXCOM G7 SENSOR) MISC, 1 each by Other route., Disp: , Rfl:    Cyanocobalamin  (VITAMIN B-12) 5000 MCG TBDP, Take 10,000 mcg by mouth every evening., Disp: , Rfl:    ELIQUIS  5 MG TABS tablet, TAKE 1 TABLET BY MOUTH 2 TIMES DAILY., Disp: 60 tablet, Rfl: 5   ferrous sulfate  325 (65 FE) MG EC tablet, Take 325 mg by mouth every evening., Disp: , Rfl:    flecainide  (TAMBOCOR ) 50 MG tablet, TAKE ONE TABLET BY MOUTH TWICE DAILY, Disp: 180 tablet, Rfl: 1   GEMTESA  75 MG TABS, TAKE 1 TABLET BY MOUTH ONCE DAILY, Disp: 30 tablet, Rfl: 11   glimepiride  (AMARYL ) 1 MG tablet, Take 1 mg by mouth daily with breakfast., Disp: , Rfl:    JARDIANCE 25 MG TABS tablet, Take 25 mg by mouth in the morning., Disp: , Rfl:    metFORMIN  (GLUCOPHAGE -XR) 500 MG 24 hr tablet, Take 1,000 mg by mouth 2 (two) times daily with a meal., Disp: , Rfl:    metoprolol  succinate (TOPROL -XL) 25 MG 24 hr tablet, TAKE 1 TABLET BY MOUTH ONCE DAILY, Disp: 90 tablet, Rfl: 3   naphazoline-glycerin  (CLEAR EYES REDNESS) 0.012-0.25 % SOLN, Place 1-2 drops into both eyes 4 (four) times daily as needed (dry/irritated eyes.)., Disp: , Rfl:    OZEMPIC , 2 MG/DOSE, 8 MG/3ML SOPN, Inject 2 mg into the skin every Thursday at 6pm., Disp: , Rfl:    sertraline  (ZOLOFT ) 50 MG tablet, Take 50 mg by mouth in the morning., Disp: , Rfl:    silodosin  (RAPAFLO ) 8 MG CAPS capsule, TAKE 1 CAPSULE BY MOUTH ONCE DAILY WITH BREAKFAST, Disp: 30 capsule, Rfl: 6   zinc gluconate 50 MG tablet,  Take 50 mg by mouth every evening., Disp: , Rfl:   Physical exam:  Vitals:   03/30/24 0921  BP: 131/77  Pulse: 76  Resp: 19  Temp: (!) 96.1 F (35.6 C)  TempSrc: Tympanic  SpO2: 100%  Weight: 261 lb 11.2 oz (118.7 kg)  Height: 5' 8 (1.727 m)   Physical Exam Eyes:     Pupils: Pupils are equal, round, and reactive to light.  Cardiovascular:     Rate and Rhythm: Normal rate and regular rhythm.     Heart sounds: Normal heart sounds.  Pulmonary:     Effort: Pulmonary effort is normal.     Breath sounds: Normal breath sounds.  Skin:    General: Skin is warm and dry.  Neurological:     Mental Status: He is alert and oriented to person, place, and time.      I have personally reviewed labs listed below:    Latest Ref Rng & Units 11/28/2023    9:43 AM  CMP  Glucose 70 - 99 mg/dL 787   BUN 8 - 23 mg/dL 11   Creatinine 9.38 - 1.24 mg/dL 9.22   Sodium 864 - 854 mmol/L 128   Potassium 3.5 - 5.1 mmol/L 4.6   Chloride 98 - 111 mmol/L 90   CO2 22 - 32 mmol/L 26   Calcium  8.9 - 10.3 mg/dL 8.7   Total Protein 6.5 - 8.1 g/dL 8.0   Total Bilirubin 0.0 - 1.2 mg/dL 0.4   Alkaline Phos 38 - 126 U/L 85   AST 15 - 41 U/L 13   ALT 0 - 44 U/L 18       Latest Ref Rng & Units 03/30/2024    8:59 AM  CBC  WBC 4.0 - 10.5 K/uL 10.3   Hemoglobin 13.0 - 17.0 g/dL 86.6   Hematocrit 60.9 - 52.0 % 40.7   Platelets 150 - 400 K/uL 396      Assessment and plan- Patient is a 78 y.o. male here for routine follow-up of following issues:  History of iron  deficiency anemia: Patient is not presently anemic with an H&H of 13.3/40.7.  However he does have evidence of iron  deficiency given that his ferritin level is low at 37 with an iron  saturation of 13%.  We will reach out to the patient if he would like to proceed with IV iron  since iron  studies came back after he saw me.  He has received IV iron  Venofer  in the past.  Intra-abdominal adenopathy: This was noticed a year ago and he has had multiple  CT scans and PET scans to follow-up on these findings and the pelvic adenopathy essentially remained stable with very minimal uptake on PET scan.  He did have a biopsy of the external iliac lymph nodes which did not show any evidence of malignancy.  It was noted to have scant lymphoid tissue for evaluation.  Given the location of the lymph nodes he has not easily amenable for any biopsy.  I will repeat CT scan in June 2026 and see him at that time.    Visit Diagnosis 1. Adenopathy      Dr. Annah Skene, MD, MPH [facility] [phone] 03/30/2024 12:38 PM

## 2024-03-30 NOTE — Progress Notes (Signed)
 Patient doing okay, states he's been doing water aerobics daily, he also says his appetite has been on a roller coaster.

## 2024-03-31 ENCOUNTER — Other Ambulatory Visit: Payer: Self-pay

## 2024-03-31 ENCOUNTER — Encounter: Payer: Self-pay | Admitting: Oncology

## 2024-03-31 DIAGNOSIS — D509 Iron deficiency anemia, unspecified: Secondary | ICD-10-CM

## 2024-03-31 NOTE — Telephone Encounter (Signed)
 Please refill Venofer  labs only visit CBC ferritin and iron  studies in 5 months

## 2024-04-01 ENCOUNTER — Encounter: Payer: Self-pay | Admitting: Oncology

## 2024-05-04 ENCOUNTER — Other Ambulatory Visit: Payer: Self-pay

## 2024-05-04 DIAGNOSIS — N401 Enlarged prostate with lower urinary tract symptoms: Secondary | ICD-10-CM

## 2024-05-06 ENCOUNTER — Other Ambulatory Visit: Payer: Self-pay

## 2024-05-06 DIAGNOSIS — N401 Enlarged prostate with lower urinary tract symptoms: Secondary | ICD-10-CM

## 2024-05-07 ENCOUNTER — Encounter: Payer: Self-pay | Admitting: Urology

## 2024-05-07 ENCOUNTER — Ambulatory Visit: Payer: Self-pay | Admitting: Urology

## 2024-05-07 VITALS — BP 129/80 | HR 86 | Ht 69.0 in | Wt 250.0 lb

## 2024-05-07 DIAGNOSIS — N433 Hydrocele, unspecified: Secondary | ICD-10-CM | POA: Diagnosis not present

## 2024-05-07 DIAGNOSIS — R3129 Other microscopic hematuria: Secondary | ICD-10-CM | POA: Diagnosis not present

## 2024-05-07 DIAGNOSIS — N401 Enlarged prostate with lower urinary tract symptoms: Secondary | ICD-10-CM

## 2024-05-07 LAB — BLADDER SCAN AMB NON-IMAGING

## 2024-05-07 LAB — URINALYSIS, COMPLETE
Bilirubin, UA: NEGATIVE
Ketones, UA: NEGATIVE
Leukocytes,UA: NEGATIVE
Nitrite, UA: NEGATIVE
Protein,UA: NEGATIVE
Specific Gravity, UA: 1.01 (ref 1.005–1.030)
Urobilinogen, Ur: 0.2 mg/dL (ref 0.2–1.0)
pH, UA: 6 (ref 5.0–7.5)

## 2024-05-07 LAB — MICROSCOPIC EXAMINATION
Bacteria, UA: NONE SEEN
Epithelial Cells (non renal): NONE SEEN /HPF (ref 0–10)

## 2024-05-07 LAB — PSA: Prostate Specific Ag, Serum: 3.2 ng/mL (ref 0.0–4.0)

## 2024-05-07 NOTE — Progress Notes (Signed)
 05/07/2024 8:41 AM   [name] [name] 1946-03-29 [id number]  Referring provider: Lenon Faden, MD [address], [address] [place],  [place] [address]  Chief Complaint  Patient presents with   Follow-up   Urologic history  1. BPH with LUTS    2. Microhematuria CT abdomen and pelvis, no uptrack abnormalities.  Cystoscopy 10/2022 lateral lobe enlargement/hypervascularity Prostate volume calculated on CT approximately 120 cc.  3.  Left hydrocele Aspiration 03/2021, 01/2024   HPI: [name] [name] is a 78 y.o. male who presents for follow-up.  He had requested a PSA which was drawn prior to the appointment and was normal at 3.2 Remains on silodosin  and Gemtesa  with stable lower urinary tract symptoms.  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   a.) TTE 09/26/2022: asc Ao 36 mm, Ao root 39 mm   Atrial fibrillation (HCC) 12/28/2020   a.) CHA2DS2-VASc = 5 (age x2, HTN, vascular disease history, T2DM) as of 07/29/2023; b.) s/p DCCV 02/19/2021  --> 200 J x1 -> NSR; c.) cardiac rate/rhythm maintained on oral flecainide  + metoprolol  succinate; chronically anticoagulated using apixaban    B12 deficiency    Bilateral adrenal adenomas    BPH (benign prostatic hyperplasia)    CAD (coronary artery disease)    Cholelithiasis    DDD (degenerative disc disease), lumbar    Diastolic dysfunction 09/26/2019   a.) TTE 09/26/2019: EF 60-65%, no RWMAs, G1DD, mild LAE, norm RVSF, asc Ao 36 mm, Ao root 39 mm   Diverticulosis    Elevated platelet count    Hemorrhoids    HLD (hyperlipidemia)    HOH (hard of hearing); wears BILATERAL hearing aids    Hypertension    IDA (iron  deficiency anemia)    Left hydrocele    Left  inguinal hernia    On apixaban  therapy    OSA on CPAP    Pelvic lymphadenopathy    PSVT (paroxysmal supraventricular tachycardia) 03/26/2021   a.) Zio 03/26/2021: 4 beat run at max rate 96 bpm   PVC's (premature ventricular contractions)    a.) Zio 03/26/2021: frequent (9.6% burden)   Renal cyst, left    Spinal stenosis    Status post bilateral cataract extraction 2020   T2DM (type 2 diabetes mellitus) (HCC)     Surgical History: Past Surgical History:  Procedure Laterality Date   CARDIOVERSION N/A 02/19/2021   Procedure: CARDIOVERSION;  Surgeon: Darron Deatrice LABOR, MD;  Location: [facility];  Service: Cardiovascular;  Laterality: N/A;   CATARACT EXTRACTION W/PHACO Left 07/14/2018   Procedure: CATARACT EXTRACTION PHACO AND INTRAOCULAR LENS PLACEMENT (IOC) LEFT, DIABETIC;  Surgeon: Jaye Fallow, MD;  Location: [facility];  Service: Ophthalmology;  Laterality: Left;  US  00:50 CDE 8.93 Fluid pack lot # [id number]   CATARACT EXTRACTION W/PHACO Right 09/01/2018   Procedure: CATARACT EXTRACTION PHACO AND INTRAOCULAR LENS PLACEMENT (IOC)-RIGHT, DIABETIC;  Surgeon: Jaye Fallow, MD;  Location: [facility];  Service: Ophthalmology;  Laterality: Right;  US  00:50.9 CDE 6.80 Fluid Pack Lot # [id number]     COLONOSCOPY     CYSTOSCOPY WITH INSERTION OF UROLIFT     CYSTOSCOPY WITH INSERTION OF UROLIFT  08/2018   ESOPHAGOGASTRODUODENOSCOPY (EGD) WITH PROPOFOL  N/A 03/17/2019   Procedure: ESOPHAGOGASTRODUODENOSCOPY (  EGD) WITH PROPOFOL ;  Surgeon: Toledo, Ladell POUR, MD;  Location: [facility];  Service: Gastroenterology;  Laterality: N/A;   REVERSE SHOULDER ARTHROPLASTY Right 07/31/2023   Procedure: REVERSE SHOULDER ARTHROPLASTY;  Surgeon: Edie Norleen PARAS, MD;  Location: [facility];  Service: Orthopedics;  Laterality: Right;   TOTAL HIP ARTHROPLASTY Left 02/23/2020   Procedure: TOTAL HIP ARTHROPLASTY;  Surgeon: Mardee Lynwood SQUIBB, MD;  Location: [facility];  Service: Orthopedics;  Laterality: Left;   TOTAL  HIP ARTHROPLASTY Right 01/10/2021   Procedure: TOTAL HIP ARTHROPLASTY;  Surgeon: Mardee Lynwood SQUIBB, MD;  Location: [facility];  Service: Orthopedics;  Laterality: Right;   TOTAL KNEE ARTHROPLASTY Bilateral 2014   Total knee replacement.    Home Medications:  Allergies as of 05/07/2024   No Known Allergies      Medication List        Accurate as of May 07, 2024  8:41 AM. If you have any questions, ask your nurse or doctor.          acetaminophen  650 MG CR tablet Commonly known as: TYLENOL  Take 1,300 mg by mouth every 8 (eight) hours as needed for pain.   ALPRAZolam  0.5 MG tablet Commonly known as: XANAX  Take 1 mg by mouth at bedtime as needed for sleep.   amLODipine  5 MG tablet Commonly known as: NORVASC  Take 5 mg by mouth daily.   atorvastatin  40 MG tablet Commonly known as: LIPITOR TAKE 1 TABLET BY MOUTH ONCE DAILY   benazepril -hydrochlorthiazide 20-12.5 MG tablet Commonly known as: LOTENSIN  HCT Take 1 tablet by mouth in the morning.   Dexcom G7 Sensor Misc 1 each by Other route.   Eliquis  5 MG Tabs tablet Generic drug: apixaban  TAKE 1 TABLET BY MOUTH 2 TIMES DAILY.   ferrous sulfate  325 (65 FE) MG EC tablet Take 325 mg by mouth every evening.   flecainide  50 MG tablet Commonly known as: TAMBOCOR  TAKE ONE TABLET BY MOUTH TWICE DAILY   Gemtesa  75 MG Tabs Generic drug: Vibegron  TAKE 1 TABLET BY MOUTH ONCE DAILY   glimepiride  1 MG tablet Commonly known as: AMARYL  Take 1 mg by mouth daily with breakfast.   Jardiance 25 MG Tabs tablet Generic drug: empagliflozin Take 25 mg by mouth in the morning.   metFORMIN  500 MG 24 hr tablet Commonly known as: GLUCOPHAGE -XR Take 1,000 mg by mouth 2 (two) times daily with a meal.   metoprolol  succinate 25 MG 24 hr tablet Commonly known as: TOPROL -XL TAKE 1 TABLET BY MOUTH ONCE DAILY   naphazoline-glycerin  Soln Commonly known as: CLEAR EYES REDNESS Place 1-2 drops into both eyes 4 (four) times daily as needed  (dry/irritated eyes.).   Ozempic  (2 MG/DOSE) 8 MG/3ML Sopn Generic drug: Semaglutide  (2 MG/DOSE) Inject 2 mg into the skin every Thursday at 6pm.   sertraline  50 MG tablet Commonly known as: ZOLOFT  Take 50 mg by mouth in the morning.   silodosin  8 MG Caps capsule Commonly known as: RAPAFLO  TAKE 1 CAPSULE BY MOUTH ONCE DAILY WITH BREAKFAST   Vitamin B-12 5000 MCG Tbdp Take 10,000 mcg by mouth every evening.   zinc gluconate 50 MG tablet Take 50 mg by mouth every evening.        Allergies: No Known Allergies  Family History: Family History  Problem Relation Age of Onset   Diabetes Mother    Alzheimer's disease Mother    Colon cancer Sister     Social History:  reports that he has never smoked. He has never used smokeless  tobacco. He reports current alcohol use. He reports that he does not use drugs.   Physical Exam: BP 129/80   Pulse 86   Ht 5' 9 (1.753 m)   Wt 250 lb (113.4 kg)   BMI 36.92 kg/m   Constitutional:  Alert, No acute distress. HEENT: [place] AT Respiratory: Normal respiratory effort, no increased work of breathing. Psychiatric: Normal mood and affect.  Laboratory  Urinalysis Dipstick 3+ glucose/trace blood Microscopy negative WBC/RBC  Assessment & Plan:    1.  BPH with LUTS Stable Continue silodosin /Gemtesa  PVR today 12 mL Discussed outlet procedures however he states his symptoms are not bothersome enough that he would want to pursue surgery  2.  Microhematuria Prior negative evaluation UA today clear  3.  Hydrocele He states he would be interested in hydrocelectomy when fluid reaccumulates.  He also has a large inguinal hernia superior to the hydrocele and would recommend hernia repair at the time of hydrocelectomy  4.  Prostate cancer screening We discussed prostate cancer screening is not recommended after age 40.  Most recent PSA was normal and recommend discontinuing PSA checks   Glendia JAYSON Barba, MD  [facility] [address], [address] [place], [place] [address] [phone]

## 2024-06-17 ENCOUNTER — Other Ambulatory Visit: Payer: Self-pay | Admitting: Urology

## 2024-07-13 ENCOUNTER — Other Ambulatory Visit: Payer: Self-pay | Admitting: Cardiovascular Disease

## 2024-07-14 ENCOUNTER — Telehealth: Payer: Self-pay | Admitting: Cardiovascular Disease

## 2024-07-14 NOTE — Telephone Encounter (Signed)
" °*  STAT* If patient is at the pharmacy, call can be transferred to refill team.   1. Which medications need to be refilled? (please list name of each medication and dose if known)   flecainide  (TAMBOCOR ) 50 MG tablet    2. Which pharmacy/location (including street and city if local pharmacy) is medication to be sent to?  [facility] - [place], [place] - [address]    3. Do they need a 30 day or 90 day supply? 90 . "

## 2024-07-14 NOTE — Telephone Encounter (Signed)
 In accordance with refill protocols, please review and address the following requirements before this medication refill can be authorized:  Labs

## 2024-07-15 NOTE — Telephone Encounter (Signed)
 Patient is asking if his rx of Flecainide  has been called to [facility]. Please call and advise

## 2024-07-15 NOTE — Progress Notes (Unsigned)
 "  Cardiology Office Note    Date:  07/16/2024   ID:  [name] [name], DOB May 09, 1946, MRN [id number]  PCP:  Lenon Layman ORN, MD  Cardiologist:  Deatrice Cage, MD  Electrophysiologist:  None   Chief Complaint: Follow-up  History of Present Illness:   [name] [name] is a 79 y.o. male with history of coronary artery calcification and aortic atherosclerosis, persistent A. Fib status post DCCV on 02/19/2021, PVCs, DM2, HTN, HLD, iron  deficiency anemia requiring IV iron , osteoarthritis, diverticulosis, OSA on CPAP, BPH, intra-abdominal adenopathy, and obesity who presents for follow-up of coronary calcification, A-fib, and PVCs.   He was seen as a new patient by Dr. Cage on 01/02/2021 for preoperative cardiovascular risk stratification before total hip arthroplasty given preop EKG 12/28/2020 showed newly diagnosed A. fib.  At his visit with our office in 12/2020, he was not aware of any prior history of arrhythmia.  He had no previously known cardiac history.  He denied any symptoms of angina or decompensation.  He remained in A. fib with ventricular rates in the 90s bpm.  Given his CHA2DS2-VASc of 4, initiation of Eliquis  was recommended following hip surgery.  He was not requiring rate controlling medications.  Echo on 01/03/2021 demonstrated an EF of 55 to 60%, no regional wall motion abnormalities, indeterminate LV diastolic function parameters, normal RV systolic function and ventricular cavity size, mildly dilated left atrium, trivial mitral regurgitation, and an estimated right atrial pressure of 3 mmHg.  He was seen in follow-up on 02/08/2021 and was doing well from a cardiac perspective.  He remained in A. fib with controlled ventricular response.  He was started on carvedilol  3.125 mg twice daily.  He had been adherent to anticoagulation.  He subsequently underwent successful DCCV on 02/19/2021.  He was seen in the office on 03/16/2021 and was maintaining sinus rhythm with frequent PVCs noted in a pattern  of ventricular trigeminy.  Carvedilol  was titrated to 6.25 mg twice daily.  Subsequent 3-day Zio patch showed a predominant rhythm of sinus with an average rate of 81 bpm (range 63 to 121 bpm), first-degree AV block, 1 run of SVT lasting 4 beats, rare PACs, and frequent PVCs with a burden of 9.6%.  Given underlying first-degree AV block, further titration of beta-blocker was deferred.  For exertional shortness of breath and fatigue he underwent Lexiscan  MPI on 04/13/2021 which showed no significant ischemia with normal wall motion, EF estimated at 47% with GI uptake artifact noted (echo in 12/2020 demonstrated preserved LVSF), resting and recovery EKGs with frequent PVCs.  The study was overall low risk.  CT attenuated corrected images did show a mild coronary artery calcification and mild aortic atherosclerosis.  Given ectopy noted on EKG tracings during stress test, carvedilol  was discontinued and he was started on Lopressor  25 mg twice daily.  In follow-up he continued to have frequent PVCs and first-degree AV block leading metoprolol  to be decreased and flecainide  initiated with subsequent ETT stable.   He was seen in the office in 08/2022 and felt like he was a little more short of breath when compared to prior visits, however he was uncertain if this was just a stress weighing on him as he was undergoing evaluation for incidentally found enlarged lymph nodes and scrotal mass.  Subsequent echo in 08/2022 showed an EF of 60 to 65%, no regional wall motion abnormalities, grade 1 diastolic dysfunction, normal RV systolic function and ventricular cavity size, mildly dilated left atrium, no significant valvular abnormalities,  and borderline dilatation of the aortic root and ascending aorta measuring 39 and 36 mm respectively.  He was last seen in the office in 12/2023 and was doing well from a cardiac perspective.  He had undergone right total shoulder replacement without cardiac complication.  No changes in cardiac  pharmacotherapy were indicated at that time.  He comes in continuing to do very well from a cardiac perspective and is without symptoms of angina or cardiac decompensation.  Since he was last seen he has began exercising in the pool for 1 hour 3 days/week and notes significant improvement in functional status/stamina since starting this.  Continues to be limited by underlying orthopedic comorbidities, though is hopeful to get back on the golf course in the near future.  No falls or symptoms concerning for bleeding.  No lower extremity swelling.  Blood pressure remains well-controlled.  No palpitations, dizziness, presyncope, or syncope.  Overall feels well at this time and does not have any acute cardiac concerns.   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   a.) TTE 09/26/2022: asc Ao 36 mm, Ao root 39 mm   Atrial fibrillation (HCC) 12/28/2020   a.) CHA2DS2-VASc = 5 (age x2, HTN, vascular disease history, T2DM) as of 07/29/2023; b.) s/p DCCV 02/19/2021  --> 200 J x1 -> NSR; c.) cardiac rate/rhythm maintained on oral flecainide  + metoprolol  succinate; chronically anticoagulated using apixaban    B12 deficiency    Bilateral adrenal adenomas    BPH (benign prostatic hyperplasia)    CAD (coronary artery disease)    Cholelithiasis    DDD (degenerative disc disease), lumbar    Diastolic dysfunction 09/26/2019   a.) TTE 09/26/2019: EF 60-65%, no RWMAs, G1DD, mild LAE, norm RVSF, asc Ao 36 mm, Ao root 39 mm   Diverticulosis    Elevated platelet count    Hemorrhoids    HLD (hyperlipidemia)    HOH (hard of hearing); wears BILATERAL hearing aids    Hypertension    IDA (iron  deficiency anemia)     Left hydrocele    Left inguinal hernia    On apixaban  therapy    OSA on CPAP    Pelvic lymphadenopathy    PSVT (paroxysmal supraventricular tachycardia) 03/26/2021   a.) Zio 03/26/2021: 4 beat run at max rate 96 bpm   PVC's (premature ventricular contractions)    a.) Zio 03/26/2021: frequent (9.6% burden)   Renal cyst, left    Spinal stenosis    Status post bilateral cataract extraction 2020   T2DM (type 2 diabetes mellitus) (HCC)     Past Surgical History:  Procedure Laterality Date   CARDIOVERSION N/A 02/19/2021   Procedure: CARDIOVERSION;  Surgeon: Darron Deatrice LABOR, MD;  Location: [facility];  Service: Cardiovascular;  Laterality: N/A;   CATARACT EXTRACTION W/PHACO Left 07/14/2018   Procedure: CATARACT EXTRACTION PHACO AND INTRAOCULAR LENS PLACEMENT (IOC) LEFT, DIABETIC;  Surgeon: Jaye Fallow, MD;  Location: [facility];  Service: Ophthalmology;  Laterality: Left;  US  00:50 CDE 8.93 Fluid pack lot # [id number]   CATARACT EXTRACTION W/PHACO Right 09/01/2018   Procedure: CATARACT EXTRACTION PHACO AND INTRAOCULAR LENS PLACEMENT (IOC)-RIGHT, DIABETIC;  Surgeon: Jaye Fallow, MD;  Location: [facility];  Service: Ophthalmology;  Laterality: Right;  US  00:50.9 CDE 6.80 Fluid Pack Lot # [id number]     COLONOSCOPY     CYSTOSCOPY WITH INSERTION OF UROLIFT     CYSTOSCOPY WITH INSERTION OF UROLIFT  08/2018   ESOPHAGOGASTRODUODENOSCOPY (EGD) WITH PROPOFOL  N/A 03/17/2019   Procedure: ESOPHAGOGASTRODUODENOSCOPY (EGD) WITH PROPOFOL ;  Surgeon: Toledo, Ladell POUR, MD;  Location: [facility];  Service: Gastroenterology;  Laterality: N/A;   REVERSE SHOULDER ARTHROPLASTY Right 07/31/2023   Procedure: REVERSE SHOULDER ARTHROPLASTY;  Surgeon: Edie Norleen PARAS, MD;  Location: [facility];  Service: Orthopedics;  Laterality: Right;   TOTAL HIP ARTHROPLASTY Left 02/23/2020   Procedure: TOTAL HIP ARTHROPLASTY;  Surgeon: Mardee Lynwood SQUIBB, MD;  Location: [facility];  Service: Orthopedics;  Laterality: Left;    TOTAL HIP ARTHROPLASTY Right 01/10/2021   Procedure: TOTAL HIP ARTHROPLASTY;  Surgeon: Mardee Lynwood SQUIBB, MD;  Location: [facility];  Service: Orthopedics;  Laterality: Right;   TOTAL KNEE ARTHROPLASTY Bilateral 2014   Total knee replacement.    Current Medications: Active Medications[1]  Allergies:   Patient has no known allergies.   Social History   Socioeconomic History   Marital status: Married    Spouse name: [name]   Number of children: 3   Years of education: Not on file   Highest education level: Not on file  Occupational History   Occupation: nurse, adult, personnel officer    Comment: retired  Tobacco Use   Smoking status: Never   Smokeless tobacco: Never  Vaping Use   Vaping status: Never Used  Substance and Sexual Activity   Alcohol use: Yes    Comment: wine-occasionally Previously drank a shot of vodka in the evenings   Drug use: Never   Sexual activity: Not on file  Other Topics Concern   Not on file  Social History Narrative   ** Merged History Encounter **       Patient lives with wife.  Feels safe   Social Drivers of Health   Tobacco Use: Low Risk (07/16/2024)   Patient History    Smoking Tobacco Use: Never    Smokeless Tobacco Use: Never    Passive Exposure: Not on file  Financial Resource Strain: Low Risk  (06/16/2024)   Received from [facility]   Overall Financial Resource Strain (CARDIA)    Difficulty of Paying Living Expenses: Not hard at all  Food Insecurity: No Food Insecurity (06/16/2024)   Received [facility]   Epic    Within the past 12 months, you worried that your food would run out before you got the money to buy more.: Never true    Within the past 12 months, the food you bought just didn't last and you didn't have money to get more.: Never true  Transportation Needs: No Transportation Needs (06/16/2024)   Received from [facility] - Transportation    In the past 12  months, has lack of transportation kept you from medical appointments or from getting medications?: No    Lack of Transportation (Non-Medical): No  Physical Activity: Not on file  Stress: Not on file  Social Connections: Not on file  Depression (EYV7-0): Low Risk (03/30/2024)   Depression (PHQ2-9)    PHQ-2 Score: 0  Alcohol Screen: Not on file  Housing: Low Risk  (06/16/2024)   Received from [facility]   Epic    In the last 12 months, was there a time when you  were not able to pay the mortgage or rent on time?: No    In the past 12 months, how many times have you moved where you were living?: 0    At any time in the past 12 months, were you homeless or living in a shelter (including now)?: No  Utilities: Not At Risk (06/16/2024)   Received from [facility]   Epic    In the past 12 months has the electric, gas, oil, or water company threatened to shut off services in your home?: No  Health Literacy: Not on file     Family History:  The patient's family history includes Alzheimer's disease in his mother; Colon cancer in his sister; Diabetes in his mother.  ROS:   12-point review of systems is negative unless otherwise noted in the HPI.   EKGs/Labs/Other Studies Reviewed:    Studies reviewed were summarized above. The additional studies were reviewed today:  2D echo 09/26/2022: 1. Left ventricular ejection fraction, by estimation, is 60 to 65%. The  left ventricle has normal function. The left ventricle has no regional  wall motion abnormalities. Left ventricular diastolic parameters are  consistent with Grade I diastolic  dysfunction (impaired relaxation).   2. Right ventricular systolic function is normal. The right ventricular  size is normal. Tricuspid regurgitation signal is inadequate for assessing  PA pressure.   3. Left atrial size was mildly dilated.   4. The mitral valve is normal in structure. No evidence of mitral valve   regurgitation. No evidence of mitral stenosis.   5. The aortic valve is normal in structure. Aortic valve regurgitation is  not visualized. No aortic stenosis is present.   6. There is borderline dilatation of the aortic root, measuring 39 mm.  There is borderline dilatation of the ascending aorta, measuring 36 mm.   7. The inferior vena cava is normal in size with greater than 50%  respiratory variability, suggesting right atrial pressure of 3 mmHg.   8. Rhythm is normal sinus  __________   ETT 07/04/2021:   Exercise capacity was normal. Patient exercised for 5 min and 0 sec. Maximum HR of 160 bpm. MPHR 110.0 %. Peak METS 7.0 .   No ST deviation was noted. There were no arrhythmias during stress. There were no arrhythmias during recovery. ECG was interpretable and without significant changes. The ECG was negative for ischemia.   Normal treadmill stress test with no evidence of arrhythmia. __________   Lexiscan  MPI 03/2021:   No ST deviation was noted.   Pharmacological myocardial perfusion imaging study with no significant  ischemia Normal wall motion, EF estimated at 47%, GI uptake artifact noted No EKG changes concerning for ischemia at peak stress or in recovery. Resting and recovery EKG with frequent PVCs CT attenuation correction images with mild coronary calcification, mild aortic atherosclerosis Low risk scan   Zio patch 03/2021: Patient had a min HR of 63 bpm, max HR of 121 bpm, and avg HR of 81 bpm.  Predominant underlying rhythm was Sinus Rhythm. First Degree AV Block was present. 1  run of Supraventricular Tachycardia occurred lasting 4 beats with a max rate of 96 bpm (avg 92 bpm). Rare PACs. Frequent PVCs with a burden of 9.6%. __________   2D echo 01/03/2021: 1. Left ventricular ejection fraction, by estimation, is 55 to 60%. The  left ventricle has normal function. The left ventricle has no regional  wall motion abnormalities. Left ventricular diastolic parameters  are  indeterminate.  2. Right ventricular systolic function is normal. The right ventricular  size is normal.   3. Left atrial size was mildly dilated.   4. The mitral valve is normal in structure. Trivial mitral valve  regurgitation.   5. The aortic valve is tricuspid. Aortic valve regurgitation is not  visualized.   6. The inferior vena cava is normal in size with greater than 50%  respiratory variability, suggesting right atrial pressure of 3 mmHg.    EKG:  EKG is ordered today.  The EKG ordered today demonstrates NSR, 70 bpm, 1st degree AV block, low voltage QRS, nonspecific ST/T changes  Recent Labs: 11/28/2023: ALT 18; BUN 11; Creatinine 0.77; Potassium 4.6; Sodium 128 03/30/2024: Hemoglobin 13.3; Platelet Count 396  Recent Lipid Panel    Component Value Date/Time   CHOL 170 12/20/2014 1238   TRIG 75 12/20/2014 1238   HDL 65 12/20/2014 1238   CHOLHDL 2.6 12/20/2014 1238   LDLCALC 90 12/20/2014 1238    PHYSICAL EXAM:    VS:  BP 125/79 (BP Location: Left Arm, Patient Position: Sitting, Cuff Size: Normal)   Pulse 70 Comment: 75 oximeter  Ht 5' 9 (1.753 m)   Wt 252 lb (114.3 kg)   SpO2 99%   BMI 37.21 kg/m   BMI: Body mass index is 37.21 kg/m.  Physical Exam Vitals reviewed.  Constitutional:      Appearance: He is well-developed.  HENT:     Head: Normocephalic and atraumatic.  Eyes:     General:        Right eye: No discharge.        Left eye: No discharge.  Cardiovascular:     Rate and Rhythm: Normal rate and regular rhythm.     Heart sounds: Normal heart sounds, S1 normal and S2 normal. Heart sounds not distant. No midsystolic click and no opening snap. No murmur heard.    No friction rub.  Pulmonary:     Effort: Pulmonary effort is normal. No respiratory distress.     Breath sounds: Normal breath sounds. No decreased breath sounds, wheezing, rhonchi or rales.  Musculoskeletal:     Cervical back: Normal range of motion.     Right lower leg: No edema.      Left lower leg: No edema.  Skin:    General: Skin is warm and dry.     Nails: There is no clubbing.  Neurological:     Mental Status: He is alert and oriented to person, place, and time.  Psychiatric:        Speech: Speech normal.        Behavior: Behavior normal.        Thought Content: Thought content normal.        Judgment: Judgment normal.     Wt Readings from Last 3 Encounters:  07/16/24 252 lb (114.3 kg)  05/07/24 250 lb (113.4 kg)  03/30/24 261 lb 11.2 oz (118.7 kg)     ASSESSMENT & PLAN:   Persistent A-fib: Maintaining sinus rhythm on Toprol -XL 25 mg daily and flecainide  50 mg twice daily.  CHA2DS2-VASc at least 5 (HTN, age x 2, DM, vascular disease).  Remains on apixaban  5 mg twice daily and does not meet reduced dosing criteria.  No falls or symptoms concerning for bleeding.  Recent labs stable.  Frequent PVCs: Quiescent on Toprol -XL and flecainide  as outlined above.  Coronary artery calcification/aortic atherosclerosis/HLD: No symptoms suggestive of angina.  LDL 58 in 03/2024.  Remains on atorvastatin  40 mg.  HTN: Blood pressure is well controlled in the office today.  Remains on amlodipine  5 mg, benazepril /HCTZ 20/12.5 mg, and Toprol -XL 25 mg.   Disposition: F/u with Dr. Aida or an APP in 6 months.   Medication Adjustments/Labs and Tests Ordered: Current medicines are reviewed at length with the patient today.  Concerns regarding medicines are outlined above. Medication changes, Labs and Tests ordered today are summarized above and listed in the Patient Instructions accessible in Encounters.   Signed, Bernardino Bring, PA-C 07/16/2024 9:52 AM     [facility] [place] [address] [place], [place] [address] [phone]     [1]  Current Meds  Medication Sig   acetaminophen  (TYLENOL ) 650 MG CR tablet Take 1,300 mg by mouth every 8 (eight) hours as needed for pain.   ALPRAZolam  (XANAX ) 0.5 MG tablet Take 1 mg by mouth at bedtime as  needed for sleep.   amLODipine  (NORVASC ) 5 MG tablet Take 5 mg by mouth daily.   atorvastatin  (LIPITOR) 40 MG tablet TAKE 1 TABLET BY MOUTH ONCE DAILY   benazepril -hydrochlorthiazide (LOTENSIN  HCT) 20-12.5 MG tablet Take 1 tablet by mouth in the morning.   Continuous Glucose Sensor (DEXCOM G7 SENSOR) MISC 1 each by Other route.   Cyanocobalamin  (VITAMIN B-12) 5000 MCG TBDP Take 10,000 mcg by mouth every evening.   ferrous sulfate  325 (65 FE) MG EC tablet Take 325 mg by mouth every evening.   GEMTESA  75 MG TABS TAKE 1 TABLET BY MOUTH ONCE DAILY   glimepiride  (AMARYL ) 1 MG tablet Take 1 mg by mouth daily with breakfast.   JARDIANCE 25 MG TABS tablet Take 25 mg by mouth in the morning.   metFORMIN  (GLUCOPHAGE -XR) 500 MG 24 hr tablet Take 1,000 mg by mouth 2 (two) times daily with a meal.   metoprolol  succinate (TOPROL -XL) 25 MG 24 hr tablet TAKE 1 TABLET BY MOUTH ONCE DAILY   naphazoline-glycerin  (CLEAR EYES REDNESS) 0.012-0.25 % SOLN Place 1-2 drops into both eyes 4 (four) times daily as needed (dry/irritated eyes.).   OZEMPIC , 2 MG/DOSE, 8 MG/3ML SOPN Inject 2 mg into the skin every Thursday at 6pm.   sertraline  (ZOLOFT ) 50 MG tablet Take 50 mg by mouth in the morning.   silodosin  (RAPAFLO ) 8 MG CAPS capsule TAKE 1 CAPSULE BY MOUTH ONCE DAILY WITH BREAKFAST   zinc gluconate 50 MG tablet Take 50 mg by mouth every evening.   [DISCONTINUED] ELIQUIS  5 MG TABS tablet TAKE 1 TABLET BY MOUTH 2 TIMES DAILY.   [DISCONTINUED] flecainide  (TAMBOCOR ) 50 MG tablet TAKE ONE TABLET BY MOUTH TWICE DAILY   "

## 2024-07-16 ENCOUNTER — Encounter: Payer: Self-pay | Admitting: Physician Assistant

## 2024-07-16 ENCOUNTER — Ambulatory Visit: Attending: Physician Assistant | Admitting: Physician Assistant

## 2024-07-16 VITALS — BP 125/79 | HR 70 | Ht 69.0 in | Wt 252.0 lb

## 2024-07-16 DIAGNOSIS — I2510 Atherosclerotic heart disease of native coronary artery without angina pectoris: Secondary | ICD-10-CM

## 2024-07-16 DIAGNOSIS — I493 Ventricular premature depolarization: Secondary | ICD-10-CM

## 2024-07-16 DIAGNOSIS — I700 Atherosclerosis of aorta: Secondary | ICD-10-CM | POA: Diagnosis not present

## 2024-07-16 DIAGNOSIS — E785 Hyperlipidemia, unspecified: Secondary | ICD-10-CM | POA: Diagnosis not present

## 2024-07-16 DIAGNOSIS — I10 Essential (primary) hypertension: Secondary | ICD-10-CM | POA: Diagnosis not present

## 2024-07-16 DIAGNOSIS — I4819 Other persistent atrial fibrillation: Secondary | ICD-10-CM

## 2024-07-16 MED ORDER — APIXABAN 5 MG PO TABS: 5.0000 mg | ORAL_TABLET | Freq: Two times a day (BID) | ORAL | 3 refills | Status: AC

## 2024-07-16 MED ORDER — FLECAINIDE ACETATE 50 MG PO TABS: 50.0000 mg | ORAL_TABLET | Freq: Two times a day (BID) | ORAL | 3 refills | Status: AC

## 2024-07-16 NOTE — Patient Instructions (Signed)
 Medication Instructions:  Your physician recommends that you continue on your current medications as directed. Please refer to the Current Medication list given to you today.  *If you need a refill on your cardiac medications before your next appointment, please call your pharmacy*  Lab Work: No labs ordered today  If you have labs (blood work) drawn today and your tests are completely normal, you will receive your results only by: MyChart Message (if you have MyChart) OR A paper copy in the mail If you have any lab test that is abnormal or we need to change your treatment, we will call you to review the results.  Testing/Procedures: No test ordered today   Follow-Up: At [facility], you and your health needs are our priority.  As part of our continuing mission to provide you with exceptional heart care, our providers are all part of one team.  This team includes your primary Cardiologist (physician) and Advanced Practice Providers or APPs (Physician Assistants and Nurse Practitioners) who all work together to provide you with the care you need, when you need it.  Your next appointment:   6 month(s)  Provider:   You may see Deatrice Cage, MD or one of the following Advanced Practice Providers on your designated Care Team:    Bernardino Bring, PA-C   We recommend signing up for the patient portal called MyChart.  Sign up information is provided on this After Visit Summary.  MyChart is used to connect with patients for Virtual Visits (Telemedicine).  Patients are able to view lab/test results, encounter notes, upcoming appointments, etc.  Non-urgent messages can be sent to your provider as well.   To learn more about what you can do with MyChart, go to [email or web address].

## 2024-07-29 ENCOUNTER — Ambulatory Visit: Admitting: Physician Assistant

## 2024-08-31 ENCOUNTER — Other Ambulatory Visit

## 2024-12-13 ENCOUNTER — Other Ambulatory Visit

## 2024-12-27 ENCOUNTER — Ambulatory Visit: Admitting: Oncology

## 2024-12-27 ENCOUNTER — Other Ambulatory Visit

## 2025-05-06 ENCOUNTER — Ambulatory Visit: Admitting: Urology
